# Patient Record
Sex: Male | Born: 1985 | Race: Black or African American | Hispanic: No | Marital: Single | Smoking: Current every day smoker
Health system: Southern US, Community
[De-identification: ages and names within clinical notes are randomized; demographics above are authoritative.]

## PROBLEM LIST (undated history)

## (undated) ENCOUNTER — Emergency Department (HOSPITAL_COMMUNITY): Admission: EM | Payer: MEDICAID | Source: Home / Self Care

## (undated) ENCOUNTER — Ambulatory Visit (HOSPITAL_COMMUNITY): Payer: No Payment, Other | Source: Home / Self Care

## (undated) ENCOUNTER — Ambulatory Visit (HOSPITAL_COMMUNITY): Admission: EM | Payer: No Payment, Other | Source: Home / Self Care

## (undated) DIAGNOSIS — F419 Anxiety disorder, unspecified: Secondary | ICD-10-CM

## (undated) DIAGNOSIS — F32A Depression, unspecified: Secondary | ICD-10-CM

## (undated) DIAGNOSIS — F200 Paranoid schizophrenia: Secondary | ICD-10-CM

## (undated) DIAGNOSIS — I639 Cerebral infarction, unspecified: Secondary | ICD-10-CM

## (undated) DIAGNOSIS — R519 Headache, unspecified: Secondary | ICD-10-CM

## (undated) DIAGNOSIS — J45909 Unspecified asthma, uncomplicated: Secondary | ICD-10-CM

## (undated) DIAGNOSIS — F209 Schizophrenia, unspecified: Secondary | ICD-10-CM

## (undated) HISTORY — DX: Cerebral infarction, unspecified: I63.9

## (undated) HISTORY — PX: NO PAST SURGERIES: SHX2092

## (undated) HISTORY — DX: Headache, unspecified: R51.9

## (undated) HISTORY — DX: Schizophrenia, unspecified: F20.9

---
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
## 2006-12-03 ENCOUNTER — Encounter: Admission: RE | Admit: 2006-12-03 | Discharge: 2006-12-03 | Payer: Self-pay | Admitting: Internal Medicine

## 2016-03-26 ENCOUNTER — Emergency Department (HOSPITAL_COMMUNITY)
Admission: EM | Admit: 2016-03-26 | Discharge: 2016-03-27 | Disposition: A | Discharge status: Home / Self Care | Payer: Self-pay | Attending: Emergency Medicine | Admitting: Emergency Medicine

## 2016-03-26 ENCOUNTER — Encounter (HOSPITAL_COMMUNITY): Payer: Self-pay | Admitting: *Deleted

## 2016-03-26 ENCOUNTER — Emergency Department (HOSPITAL_COMMUNITY): Payer: Self-pay

## 2016-03-26 DIAGNOSIS — M79602 Pain in left arm: Secondary | ICD-10-CM | POA: Insufficient documentation

## 2016-03-26 DIAGNOSIS — R079 Chest pain, unspecified: Secondary | ICD-10-CM | POA: Insufficient documentation

## 2016-03-26 DIAGNOSIS — F17200 Nicotine dependence, unspecified, uncomplicated: Secondary | ICD-10-CM | POA: Insufficient documentation

## 2016-03-26 DIAGNOSIS — J45909 Unspecified asthma, uncomplicated: Secondary | ICD-10-CM | POA: Insufficient documentation

## 2016-03-26 HISTORY — DX: Unspecified asthma, uncomplicated: J45.909

## 2016-03-26 LAB — CBC WITH DIFFERENTIAL/PLATELET
BASOS ABS: 0 10*3/uL (ref 0.0–0.1)
BASOS PCT: 1 %
EOS ABS: 0.5 10*3/uL (ref 0.0–0.7)
Eosinophils Relative: 7 %
HEMATOCRIT: 46.1 % (ref 39.0–52.0)
Hemoglobin: 15.9 g/dL (ref 13.0–17.0)
Lymphocytes Relative: 39 %
Lymphs Abs: 2.6 10*3/uL (ref 0.7–4.0)
MCH: 33.5 pg (ref 26.0–34.0)
MCHC: 34.5 g/dL (ref 30.0–36.0)
MCV: 97.1 fL (ref 78.0–100.0)
MONO ABS: 0.4 10*3/uL (ref 0.1–1.0)
Monocytes Relative: 6 %
NEUTROS ABS: 3.1 10*3/uL (ref 1.7–7.7)
Neutrophils Relative %: 47 %
PLATELETS: 293 10*3/uL (ref 150–400)
RBC: 4.75 MIL/uL (ref 4.22–5.81)
RDW: 12.1 % (ref 11.5–15.5)
WBC: 6.6 10*3/uL (ref 4.0–10.5)

## 2016-03-26 LAB — BASIC METABOLIC PANEL
ANION GAP: 12 (ref 5–15)
BUN: 10 mg/dL (ref 6–20)
CALCIUM: 9.4 mg/dL (ref 8.9–10.3)
CO2: 24 mmol/L (ref 22–32)
CREATININE: 0.87 mg/dL (ref 0.61–1.24)
Chloride: 101 mmol/L (ref 101–111)
GLUCOSE: 89 mg/dL (ref 65–99)
Potassium: 4.5 mmol/L (ref 3.5–5.1)
Sodium: 137 mmol/L (ref 135–145)

## 2016-03-26 LAB — I-STAT TROPONIN, ED: TROPONIN I, POC: 0.01 ng/mL (ref 0.00–0.08)

## 2016-03-26 LAB — MAGNESIUM: MAGNESIUM: 2.2 mg/dL (ref 1.7–2.4)

## 2016-03-26 LAB — POTASSIUM: Potassium: 3.9 mmol/L (ref 3.5–5.1)
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
## 2016-03-26 NOTE — ED Notes (Signed)
Patient transported to X-ray 

## 2016-03-26 NOTE — ED Provider Notes (Signed)
[facility]Y DEPT Provider Note   CSN: [id number] Arrival date & time: 03/26/16  1816  By signing my name below, I, Rosario AdieWilliam Andrew Hiatt, attest that this documentation has been prepared under the direction and in the presence of Arvilla MeresAshley Meyer, PA-C.  Electronically Signed: Rosario AdieWilliam Andrew Hiatt, ED Scribe. 03/26/16. 8:39 PM.  History   Chief Complaint Chief Complaint  Patient presents with  . Arm Pain   The history is provided by the patient. No language interpreter was used.    HPI Comments: Shane Small is a 30 y.o. male with a PMHx of asthma, who presents to the Emergency Department complaining of intermittent episodes of left hand and forearm cramping which began approximately one year ago. Pt states that during his episodes of cramping that his hand will ball up and close into a fist, which has happened three times over the past year. He states that this sensation will last until he pulls his fingers from his palm. Pt notes that he works at [employer] and does repetitive heavy lifting. No treatments for this issue were tried at home prior to coming into the ED. No recent trauma to the arm or changes in his daily routine to precipitate this issue. No Hx of PE/DVT, recent long travel, surgery, fracture, prolonged immobilization, hormone use. No h/o cancer. He is not currently followed by a PCP. Pt is a former smoker. He denies cough, hemoptysis, numbness, fever, wound involvement, or any other associated symptoms. He is curious if sxs are related to dehydration or electrolyte issues.   Pt also notes that he has intermittent episodes of left-sided, sharp chest pain over the past several months as well. Pt states that these episodes are completely alleviated with drinking water. He also notes chronic SOB secondary to his asthma.   Past Medical History:  Diagnosis Date  . Asthma    There are no active problems to display for this patient.  History reviewed. No pertinent surgical  history.  Home Medications    Prior to Admission medications   Not on File   Family History No family history on file.  Social History Social History  Substance Use Topics  . Smoking status: Current Some Day Smoker  . Smokeless tobacco: Former NeurosurgeonUser  . Alcohol use 8.4 oz/week    14 Cans of beer per week   Allergies   Patient has no known allergies.  Review of Systems Review of Systems  Constitutional: Negative for fever.  HENT: Negative for trouble swallowing.   Respiratory: Positive for shortness of breath ( secondary to asthma). Negative for cough.        Negative for hemoptysis.  Cardiovascular: Positive for chest pain ( intermittent).  Musculoskeletal: Positive for myalgias (left arm).  Skin: Negative for wound.  Allergic/Immunologic: Negative for immunocompromised state.  Neurological: Negative for syncope, weakness and numbness.   Physical Exam Updated Vital Signs BP 139/76 (BP Location: Right Arm)   Pulse 80   Temp 98.3 F (36.8 C) (Oral)   Resp 18   Ht 5\' 11"  (1.803 m)   Wt 150 lb (68 kg)   SpO2 98%   BMI 20.92 kg/m   Physical Exam  Constitutional: He appears well-developed and well-nourished. No distress.  HENT:  Head: Normocephalic and atraumatic.  Mouth/Throat: Oropharynx is clear and moist. No oropharyngeal exudate.  Eyes: Conjunctivae and EOM are normal. Pupils are equal, round, and reactive to light. Right eye exhibits no discharge. Left eye exhibits no discharge. No scleral icterus.  Neck: Normal  range of motion and phonation normal. Neck supple. No neck rigidity. Normal range of motion present.  Cardiovascular: Normal rate, regular rhythm, normal heart sounds and intact distal pulses.   No murmur heard. Pulmonary/Chest: Effort normal and breath sounds normal. No stridor. No respiratory distress. He has no wheezes. He has no rales.  Symmetric chest expansion. Respirations unlabored. No wheezing or rales. No hypoxia.   Abdominal: Soft. Bowel  sounds are normal. He exhibits no distension. There is no tenderness. There is no rigidity, no rebound, no guarding and no CVA tenderness.  Musculoskeletal: Normal range of motion.  No obvious deformity of left upper extremity. No swelling, warmth, erythema, or rashes appreciated. Compartments are soft. No tenderness. ROM, strength, sensation intact. Radial pulses 2+, symmetric. No lower extremity swelling.   Lymphadenopathy:    He has no cervical adenopathy.  Neurological: He is alert. He is not disoriented. Coordination and gait normal. GCS eye subscore is 4. GCS verbal subscore is 5. GCS motor subscore is 6.  Pt moves all extremities with ease. Strength and sensation intact. Pt ambulatory.   Skin: Skin is warm and dry. He is not diaphoretic.  Psychiatric: He has a normal mood and affect. His behavior is normal.   ED Treatments / Results  DIAGNOSTIC STUDIES: Oxygen Saturation is 98% on RA, normal by my interpretation.   COORDINATION OF CARE: 8:39 PM-Discussed next steps with pt. Pt verbalized understanding and is agreeable with the plan.   Labs (all labs ordered are listed, but only abnormal results are displayed) Labs Reviewed  BASIC METABOLIC PANEL  CBC WITH DIFFERENTIAL/PLATELET  MAGNESIUM  POTASSIUM  I-STAT TROPOININ, ED   EKG  EKG Interpretation  Date/Time:  Monday March 26 2016 20:56:42 EST Ventricular Rate:  78 PR Interval:  172 QRS Duration: 74 QT Interval:  362 QTC Calculation: 412 R Axis:   95 Text Interpretation:  Normal sinus rhythm Possible Left atrial enlargement Rightward axis Abnormal ECG No significant change since last tracing Confirmed by FLOYD MD, DANIEL [phone]) on 03/27/2016 2:36:17 PM      Radiology Dg Chest 2 View  Result Date: 03/26/2016 CLINICAL DATA:  Shortness of breath with chest pain EXAM: CHEST  2 VIEW COMPARISON:  None. FINDINGS: The lungs are hyperexpanded. There is no acute infiltrate, consolidation, or pleural effusion.  Cardiomediastinal silhouette within normal limits. No pneumothorax. IMPRESSION: Hyperinflation. No radiographic evidence for acute cardiopulmonary abnormality Electronically Signed   By: Jasmine Pang M.D.   On: 03/26/2016 21:25    Procedures Procedures   Medications Ordered in ED Medications - No data to display  Initial Impression / Assessment and Plan / ED Course  I have reviewed the triage vital signs and the nursing notes.  Pertinent labs & imaging results that were available during my care of the patient were reviewed by me and considered in my medical decision making (see chart for details).  Clinical Course as of Mar 28 1625  Tue Mar 27, 2016  0009 Normal cardiac silhouette. No evidence of consolidation, effusion, or PTX. No free air under diaphragm.  DG Chest 2 View [AM]    Clinical Course User Index [AM] Lona Kettle, PA-C   Patient presents to ED with complaint of intermittent left arm cramping x 1 year. Patient reports approximately 3 episodes in the last yr. No known trauma. Pt also complains of intermittent sharp, left sided chest pain for several months and chronic SOB secondary to asthma. Patient is afebrile and non-toxic appearing in NAD. VSS.  Lungs  are CTABL. Heart RRR. Left arm shows no obvious deformity, warmth, erythema, or swelling. Compartments soft, no tenderness. ROM, strength, sensation intact. 2+ radial pulses. Question of possible electrolyte abnormality. Given intermittent chest pain will check EKG, CXR, and basic labs. Discussed pt with Dr. Juleen ChinaKohut, who also evaluated patient, agrees with plan.   Labs are grossly normal. Troponin negative. EKG shows NSR, nml intervals, possible left atrial enlargement, right axis deviation. CXR shows hyperinflation, no acute cardiopulmonary process. Heart score 2. Low suspicion for ACS. Well's score 0, PERC negative. Low suspicion for PE at this time.  Electrolytes are within normal limits at this time. Unclear cause of  left arm cramping - ?dehydration vs. ?repetitive lifting causing muscle fatigue.   Discussed results and plan with patient. Encouraged patient to establish a primary provider for further evaluation and management. Resources provided for establishing a PCP. Return precautions given. Pt voiced understanding and is agreeable.   Final Clinical Impressions(s) / ED Diagnoses   Final diagnoses:  Chest pain, unspecified type  Left arm pain   New Prescriptions There are no discharge medications for this patient.  I personally performed the services described in this documentation, which was scribed in my presence. The recorded information has been reviewed and is accurate.     Lona Kettleshley Laurel Meyer, [place] JerseyPA-C 03/28/16 1648    Raeford RazorStephen Kohut, MD 03/28/16 1929

## 2016-03-27 NOTE — Discharge Instructions (Signed)
Read the information below.  Your labs and chest x-ray are re-assuring. This may be related to hydration status or over-exertion.  Please continue to stay well hydrated.  It is very important that you establish and follow up with a primary provider. I [facility]. Please call in the morning to establish care.  You may return to the Emergency Department at any time for worsening condition or any new symptoms that concern you.

## 2016-06-14 ENCOUNTER — Encounter (HOSPITAL_COMMUNITY): Payer: Self-pay | Admitting: *Deleted

## 2016-06-14 ENCOUNTER — Ambulatory Visit (HOSPITAL_COMMUNITY)
Admission: EM | Admit: 2016-06-14 | Discharge: 2016-06-14 | Disposition: A | Discharge status: Home / Self Care | Payer: Self-pay | Attending: Internal Medicine | Admitting: Internal Medicine

## 2016-06-14 DIAGNOSIS — J4541 Moderate persistent asthma with (acute) exacerbation: Secondary | ICD-10-CM

## 2016-06-14 DIAGNOSIS — Z760 Encounter for issue of repeat prescription: Secondary | ICD-10-CM

## 2016-06-14 MED ORDER — ALBUTEROL SULFATE HFA 108 (90 BASE) MCG/ACT IN AERS: 2.0000 | INHALATION_SPRAY | RESPIRATORY_TRACT | 0 refills | Status: DC | PRN

## 2016-06-14 MED ORDER — IPRATROPIUM-ALBUTEROL 0.5-2.5 (3) MG/3ML IN SOLN
3.0000 mL | Freq: Once | RESPIRATORY_TRACT | Status: AC
  Administered 2016-06-14: 3 mL via RESPIRATORY_TRACT

## 2016-06-14 MED ORDER — IPRATROPIUM-ALBUTEROL 0.5-2.5 (3) MG/3ML IN SOLN
RESPIRATORY_TRACT | Status: AC
  Filled 2016-06-14: qty 3

## 2016-06-14 NOTE — ED Triage Notes (Signed)
Patient states he ran out of his asthma medication 3 days, states he has had increased sob and cough since.

## 2016-06-14 NOTE — Discharge Instructions (Addendum)
Follow-up with your primary care doctor as needed. Make sure you keep your appointments for asthma management. You are given a refill of her albuterol inhaler today. For worsening, new symptoms, fevers, increased shortness of breath you may need to go to the emergency department.

## 2016-06-14 NOTE — ED Provider Notes (Signed)
CSN: [id number]     Arrival date & time 06/14/16  1909 History   First MD Initiated Contact with Patient 06/14/16 1927     Chief Complaint  Patient presents with  . Asthma  . Medication Refill   (Consider location/radiation/quality/duration/timing/severity/associated sxs/prior Treatment) 31 year old asthmatic states he ran out of his medicines a week ago and he is having an asthma exacerbation. He is requesting refills of his medicines. Denies upper respiratory symptoms just shortness of breath, wheezing and cough.      Past Medical History:  Diagnosis Date  . Asthma    History reviewed. No pertinent surgical history. History reviewed. No pertinent family history. Social History  Substance Use Topics  . Smoking status: Current Some Day Smoker  . Smokeless tobacco: Former Neurosurgeon  . Alcohol use 8.4 oz/week    14 Cans of beer per week    Review of Systems  HENT: Negative.   Respiratory: Positive for cough, shortness of breath and wheezing.   Cardiovascular: Negative.   Gastrointestinal: Negative.   Neurological: Negative.   All other systems reviewed and are negative.   Allergies  Patient has no known allergies.  Home Medications   Prior to Admission medications   Medication Sig Start Date End Date Taking? Authorizing Provider  albuterol (PROVENTIL HFA;VENTOLIN HFA) 108 (90 Base) MCG/ACT inhaler Inhale 2 puffs into the lungs every 4 (four) hours as needed for wheezing or shortness of breath. 06/14/16   Hayden Rasmussen, NP   Meds Ordered and Administered this Visit   Medications  ipratropium-albuterol (DUONEB) 0.5-2.5 (3) MG/3ML nebulizer solution 3 mL (not administered)    BP 150/98 (BP Location: Left Arm)   Pulse 64   Temp 98.1 F (36.7 C) (Oral)   Resp 16   SpO2 98%  No data found.   Physical Exam  Constitutional: He is oriented to person, place, and time. He appears well-developed and well-nourished.  HENT:  Head: Normocephalic and atraumatic.  Right Ear:  External ear normal.  Left Ear: External ear normal.  Eyes: EOM are normal.  Neck: Normal range of motion.  Cardiovascular: Normal rate, regular rhythm, normal heart sounds and intact distal pulses.   Pulmonary/Chest: He has wheezes.  Expiratory wheezes, prolonged expiratory phase.  Musculoskeletal: He exhibits no edema.  Lymphadenopathy:    He has no cervical adenopathy.  Neurological: He is alert and oriented to person, place, and time.  Skin: Skin is warm and dry.  Nursing note and vitals reviewed.   Urgent Care Course     Procedures (including critical care time)  Labs Review Labs Reviewed - No data to display  Imaging Review No results found.   Visual Acuity Review  Right Eye Distance:   Left Eye Distance:   Bilateral Distance:    Right Eye Near:   Left Eye Near:    Bilateral Near:         MDM   1. Moderate persistent asthma with exacerbation   2. Medication refill   Post Duoneb pt st breathing much better. Improved air movement and substantially reduced wheeze. The patient was offered an injection of steroids but the patient states that he does not do shots and does not take medicines that are unnecessary. Follow-up with your primary care doctor as needed. Make sure you keep your appointments for asthma management. You are given a refill of her albuterol inhaler today. For worsening, new symptoms, fevers, increased shortness of breath you may need to go to the emergency department. Meds ordered this  encounter  Medications  . ipratropium-albuterol (DUONEB) 0.5-2.5 (3) MG/3ML nebulizer solution 3 mL  . albuterol (PROVENTIL HFA;VENTOLIN HFA) 108 (90 Base) MCG/ACT inhaler    Sig: Inhale 2 puffs into the lungs every 4 (four) hours as needed for wheezing or shortness of breath.    Dispense:  1 Inhaler    Refill:  0    Order Specific Question:   Supervising Provider    Answer:   Eustace MooreMURRAY, LAURA W [[id number]]        Hayden Rasmussenavid Desira Alessandrini, NP 06/14/16 [phone]

## 2016-09-04 ENCOUNTER — Emergency Department (HOSPITAL_COMMUNITY)
Admission: EM | Admit: 2016-09-04 | Discharge: 2016-09-05 | Disposition: A | Discharge status: Home / Self Care | Payer: Self-pay | Attending: Emergency Medicine | Admitting: Emergency Medicine

## 2016-09-04 ENCOUNTER — Encounter (HOSPITAL_COMMUNITY): Payer: Self-pay

## 2016-09-04 DIAGNOSIS — F17200 Nicotine dependence, unspecified, uncomplicated: Secondary | ICD-10-CM | POA: Insufficient documentation

## 2016-09-04 DIAGNOSIS — Z79899 Other long term (current) drug therapy: Secondary | ICD-10-CM | POA: Insufficient documentation

## 2016-09-04 DIAGNOSIS — M79632 Pain in left forearm: Secondary | ICD-10-CM | POA: Insufficient documentation

## 2016-09-04 DIAGNOSIS — J45909 Unspecified asthma, uncomplicated: Secondary | ICD-10-CM | POA: Insufficient documentation

## 2016-09-04 DIAGNOSIS — M25551 Pain in right hip: Secondary | ICD-10-CM | POA: Insufficient documentation

## 2016-09-04 DIAGNOSIS — F22 Delusional disorders: Secondary | ICD-10-CM | POA: Insufficient documentation

## 2016-09-04 DIAGNOSIS — M79602 Pain in left arm: Secondary | ICD-10-CM | POA: Insufficient documentation

## 2016-09-04 DIAGNOSIS — R45850 Homicidal ideations: Secondary | ICD-10-CM | POA: Insufficient documentation

## 2016-09-04 DIAGNOSIS — R112 Nausea with vomiting, unspecified: Secondary | ICD-10-CM | POA: Insufficient documentation

## 2016-09-04 LAB — CBC WITH DIFFERENTIAL/PLATELET
BASOS ABS: 0 10*3/uL (ref 0.0–0.1)
Basophils Relative: 1 %
EOS ABS: 0.5 10*3/uL (ref 0.0–0.7)
EOS PCT: 8 %
HCT: 45.6 % (ref 39.0–52.0)
Hemoglobin: 15.6 g/dL (ref 13.0–17.0)
LYMPHS ABS: 2.4 10*3/uL (ref 0.7–4.0)
Lymphocytes Relative: 34 %
MCH: 34.1 pg — AB (ref 26.0–34.0)
MCHC: 34.2 g/dL (ref 30.0–36.0)
MCV: 99.8 fL (ref 78.0–100.0)
MONO ABS: 0.4 10*3/uL (ref 0.1–1.0)
Monocytes Relative: 5 %
Neutro Abs: 3.6 10*3/uL (ref 1.7–7.7)
Neutrophils Relative %: 52 %
PLATELETS: 296 10*3/uL (ref 150–400)
RBC: 4.57 MIL/uL (ref 4.22–5.81)
RDW: 11.9 % (ref 11.5–15.5)
WBC: 7 10*3/uL (ref 4.0–10.5)

## 2016-09-04 LAB — COMPREHENSIVE METABOLIC PANEL
ALT: 20 U/L (ref 17–63)
AST: 26 U/L (ref 15–41)
Albumin: 4.9 g/dL (ref 3.5–5.0)
Alkaline Phosphatase: 64 U/L (ref 38–126)
Anion gap: 10 (ref 5–15)
BILIRUBIN TOTAL: 1 mg/dL (ref 0.3–1.2)
BUN: 9 mg/dL (ref 6–20)
CO2: 29 mmol/L (ref 22–32)
Calcium: 10.4 mg/dL — ABNORMAL HIGH (ref 8.9–10.3)
Chloride: 100 mmol/L — ABNORMAL LOW (ref 101–111)
Creatinine, Ser: 0.8 mg/dL (ref 0.61–1.24)
GFR calc Af Amer: >60 mL/min (ref >60)
GLUCOSE: 96 mg/dL (ref 65–99)
Potassium: 4.2 mmol/L (ref 3.5–5.1)
Sodium: 139 mmol/L (ref 135–145)
TOTAL PROTEIN: 8.8 g/dL — AB (ref 6.5–8.1)

## 2016-09-04 LAB — ETHANOL

## 2016-09-04 MED ORDER — NICOTINE 21 MG/24HR TD PT24: 21.0000 mg | MEDICATED_PATCH | Freq: Every day | TRANSDERMAL | Status: DC

## 2016-09-04 MED ORDER — ACETAMINOPHEN 325 MG PO TABS: 650.0000 mg | ORAL_TABLET | ORAL | Status: DC | PRN

## 2016-09-04 MED ORDER — ALUM & MAG HYDROXIDE-SIMETH 200-200-20 MG/5ML PO SUSP: 30.0000 mL | ORAL | Status: DC | PRN

## 2016-09-04 MED ORDER — ONDANSETRON HCL 4 MG PO TABS: 4.0000 mg | ORAL_TABLET | Freq: Three times a day (TID) | ORAL | Status: DC | PRN

## 2016-09-04 MED ORDER — IBUPROFEN 200 MG PO TABS: 600.0000 mg | ORAL_TABLET | Freq: Three times a day (TID) | ORAL | Status: DC | PRN

## 2016-09-04 MED ORDER — LORAZEPAM 1 MG PO TABS: 1.0000 mg | ORAL_TABLET | Freq: Three times a day (TID) | ORAL | Status: DC | PRN

## 2016-09-04 NOTE — ED Notes (Signed)
Call Nicanor AlconKenneth Cala if there are any updates are questions.  [phone]

## 2016-09-04 NOTE — ED Notes (Signed)
THE FATHER STS THAT THE PT IS DELUSIONAL AND HE IS GOING TO FILE FOR IVC PAPERWORK. PA EMILY MADE AWARE.

## 2016-09-04 NOTE — ED Notes (Signed)
Patient educated about search process and term "contraband " and routine search performed. No contraband found. 
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
## 2016-09-04 NOTE — ED Triage Notes (Addendum)
Patient reports that he has been having cramping in both arms and feels like his arms ar locking up. L> R. Patient denies any pain or discomfort at this time in either arm.  Patient's father reports that the patient has been seen 3 times for the same and nothing has been found.

## 2016-09-04 NOTE — ED Provider Notes (Signed)
Medical screening examination/treatment/procedure(s) were conducted as a shared visit with non-physician practitioner(s) and myself.  I personally evaluated the patient during the encounter. Briefly, the patient is a 31 y.o. male who presents to the ED with left arm pain believing he got a blood clot and superficial vein of his arm, also states that he's been contaminated with Escherichia coli for several years which given him constipation and abdominal pain when he drinks coffee. Patient also reports he believes he has radiation poisoning [place] [facility] [place] causing him to have all these symptoms as well too. That reported that the patient disappeared yesterday [place] [facility] [place]" and try to figure out what was going on with the radiation poisoning. Patient also reports that while smoking cigarettes he feels like he admits he and radiation from his body. He states that he knows this is true because of the pupil react differently to him when he smokes.  Father also reports that the patient has been expressing homicidal ideations and aggression towards family members. Patient reports that this is more of a self defense issue stating that he believes his grandparents are trying to hurt him and states that he will kill them before they kill him.  Patient denies illicit drug use. He does report alcohol consumption last of which was yesterday after visiting [place].  Patient appears to be having delusions and homicidal ideations requiring IVC which has been filled out and signed by me. Patient will require medical screening labs. If cleared he will need evaluation by psychiatry.     Nira Connardama, Pedro Eduardo, MD 09/04/16 [phone]

## 2016-09-05 ENCOUNTER — Inpatient Hospital Stay (HOSPITAL_COMMUNITY)
Admission: AD | Admit: 2016-09-05 | Discharge: 2016-09-14 | DRG: 885 | Disposition: A | Discharge status: Home / Self Care | Payer: No Typology Code available for payment source | Attending: Psychiatry | Admitting: Psychiatry

## 2016-09-05 DIAGNOSIS — R454 Irritability and anger: Secondary | ICD-10-CM

## 2016-09-05 DIAGNOSIS — G4700 Insomnia, unspecified: Secondary | ICD-10-CM | POA: Diagnosis present

## 2016-09-05 DIAGNOSIS — F1220 Cannabis dependence, uncomplicated: Secondary | ICD-10-CM | POA: Diagnosis present

## 2016-09-05 DIAGNOSIS — F1994 Other psychoactive substance use, unspecified with psychoactive substance-induced mood disorder: Secondary | ICD-10-CM | POA: Diagnosis present

## 2016-09-05 DIAGNOSIS — F22 Delusional disorders: Secondary | ICD-10-CM

## 2016-09-05 DIAGNOSIS — F200 Paranoid schizophrenia: Secondary | ICD-10-CM | POA: Diagnosis present

## 2016-09-05 DIAGNOSIS — F17200 Nicotine dependence, unspecified, uncomplicated: Secondary | ICD-10-CM | POA: Diagnosis present

## 2016-09-05 DIAGNOSIS — R45851 Suicidal ideations: Secondary | ICD-10-CM

## 2016-09-05 DIAGNOSIS — F17210 Nicotine dependence, cigarettes, uncomplicated: Secondary | ICD-10-CM

## 2016-09-05 LAB — RAPID URINE DRUG SCREEN, HOSP PERFORMED
AMPHETAMINES: NOT DETECTED
Barbiturates: NOT DETECTED
Benzodiazepines: NOT DETECTED
COCAINE: NOT DETECTED
Opiates: NOT DETECTED
TETRAHYDROCANNABINOL: NOT DETECTED

## 2016-09-05 MED ORDER — ACETAMINOPHEN 325 MG PO TABS
650.0000 mg | ORAL_TABLET | Freq: Four times a day (QID) | ORAL | Status: DC | PRN
  Administered 2016-09-05 – 2016-09-09 (×3): 650 mg via ORAL
  Filled 2016-09-05 (×3): qty 2

## 2016-09-05 MED ORDER — LORAZEPAM 1 MG PO TABS
1.0000 mg | ORAL_TABLET | Freq: Three times a day (TID) | ORAL | Status: DC | PRN
  Administered 2016-09-05: 1 mg via ORAL
  Filled 2016-09-05: qty 1

## 2016-09-05 MED ORDER — ALUM & MAG HYDROXIDE-SIMETH 200-200-20 MG/5ML PO SUSP: 30.0000 mL | ORAL | Status: DC | PRN

## 2016-09-05 MED ORDER — NICOTINE 21 MG/24HR TD PT24
21.0000 mg | MEDICATED_PATCH | Freq: Every day | TRANSDERMAL | Status: DC
  Administered 2016-09-06: 21 mg via TRANSDERMAL
  Filled 2016-09-05 (×4): qty 1

## 2016-09-05 MED ORDER — TRAZODONE HCL 50 MG PO TABS
50.0000 mg | ORAL_TABLET | Freq: Every evening | ORAL | Status: DC | PRN
  Administered 2016-09-08 – 2016-09-13 (×5): 50 mg via ORAL
  Filled 2016-09-05 (×4): qty 1
  Filled 2016-09-05: qty 7
  Filled 2016-09-05 (×2): qty 1

## 2016-09-05 MED ORDER — IBUPROFEN 600 MG PO TABS
600.0000 mg | ORAL_TABLET | Freq: Three times a day (TID) | ORAL | Status: DC | PRN
  Administered 2016-09-09: 600 mg via ORAL
  Filled 2016-09-05: qty 1

## 2016-09-05 MED ORDER — ONDANSETRON HCL 4 MG PO TABS: 4.0000 mg | ORAL_TABLET | Freq: Three times a day (TID) | ORAL | Status: DC | PRN

## 2016-09-05 MED ORDER — MAGNESIUM HYDROXIDE 400 MG/5ML PO SUSP
30.0000 mL | Freq: Every day | ORAL | Status: DC | PRN
  Administered 2016-09-14: 30 mL via ORAL
  Filled 2016-09-05: qty 30

## 2016-09-05 MED ORDER — ALBUTEROL SULFATE HFA 108 (90 BASE) MCG/ACT IN AERS
2.0000 | INHALATION_SPRAY | Freq: Four times a day (QID) | RESPIRATORY_TRACT | Status: DC | PRN
  Administered 2016-09-06 – 2016-09-10 (×5): 2 via RESPIRATORY_TRACT
  Filled 2016-09-05: qty 6.7

## 2016-09-05 NOTE — Progress Notes (Signed)
Admission Note:  31 year old male who presents IVC, in no acute distress, for the treatment of SI, Depression, and Psychosis. Patient appears flat and depressed. Patient was calm and cooperative with admission process. Patient presents with passive SI and contracts for safety upon admission. Denies HI.  Patient endorses AVH stating "The TV be talking to me".  Patient [place] because he thought that their water was making him sick. [place] [facility], patient got a DWI resulting in additional stress.  Patient [place] him to experience AVH and have nightmares, headaches, and a possible stroke.  Patient reports experiencing these symptoms for the past 2 years.  Additionally, patient reports paranoia stating "sometimes I'm too paranoid to sleep due to nightmares".  Patient reports increasing suicidal thoughts recently.  Patient reports that he is currently unemployed and lost his job "2 weeks ago".  Patient currently lives with his dad and stepmom and identifies his biological mom and dad as his support system.  [facility], patient would like to "get rid of paranoia I have" and "Try to stop drinking".  Skin was assessed and found to be clear of any abnormal marks.  Patient searched and no contraband found, POC and unit policies explained and understanding verbalized. Consents obtained. Food and fluids offered and accepted. Patient had no additional questions or concerns.

## 2016-09-05 NOTE — Tx Team (Signed)
Initial Treatment Plan 09/05/2016 11:54 PM Shane MoraleDerrick L Small [id number]    PATIENT STRESSORS: Substance abuse Other: Psychosis   PATIENT STRENGTHS: General fund of knowledge Motivation for treatment/growth Supportive family/friends   PATIENT IDENTIFIED PROBLEMS: Psychosis  "Get rid of paranoia I have"  "Try to stop drinking"                 DISCHARGE CRITERIA:  Improved stabilization in mood, thinking, and/or behavior Motivation to continue treatment in a less acute level of care Need for constant or close observation no longer present Verbal commitment to aftercare and medication compliance  PRELIMINARY DISCHARGE PLAN: Outpatient therapy Return to previous living arrangement  PATIENT/FAMILY INVOLVEMENT: This treatment plan has been presented to and reviewed with the patient, Shane Small.  The patient and family have been given the opportunity to ask questions and make suggestions.  Carleene OverlieMiddleton, Kelley Knoth P, RN 09/05/2016, 11:54 PM

## 2016-09-05 NOTE — Progress Notes (Signed)
09/05/16 1410:  LRT went to pt room to offer activities, pt was sleep.  Quamaine Webb, LRT/CTRS 

## 2016-09-05 NOTE — BH Assessment (Signed)
[facility] Assessment Progress Note  Per Thedore MinsMojeed Akintayo, MD, this pt requires psychiatric hospitalization.  Berneice Heinrichina Tate, RN, [facility].  Pt presents under IVC initiated by EDP Drema PryPedro Cardama, MD,  and [facility]  Pt's nurse, Christen BameRonnie, has been notified, and agrees to call report to [phone].  Pt is to be transported via [employer].   Doylene Canninghomas Koury Roddy, MA Triage Specialist [phone]

## 2016-09-05 NOTE — ED Notes (Signed)
Patient appears to be having delusions and homicidal ideations . Patient denies SI and AVH. Plan of care discussed. Encouragement and support provided and safety maintain. Q 15 min safety checks in place and video monitoring.

## 2016-09-05 NOTE — ED Notes (Signed)
[facility]  [facility]
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
## 2016-09-05 NOTE — ED Notes (Signed)
Pt transported to [facility] by [employer] for continuation of specialized care. Pt left in no acute distress. Belongings signed for and given to [employer] officer. Pt left in no acute distress. 

## 2016-09-06 ENCOUNTER — Encounter (HOSPITAL_COMMUNITY): Payer: Self-pay | Admitting: *Deleted

## 2016-09-06 DIAGNOSIS — R45850 Homicidal ideations: Secondary | ICD-10-CM

## 2016-09-06 DIAGNOSIS — F17210 Nicotine dependence, cigarettes, uncomplicated: Secondary | ICD-10-CM

## 2016-09-06 DIAGNOSIS — F200 Paranoid schizophrenia: Secondary | ICD-10-CM

## 2016-09-06 DIAGNOSIS — F1994 Other psychoactive substance use, unspecified with psychoactive substance-induced mood disorder: Secondary | ICD-10-CM

## 2016-09-06 MED ORDER — ARIPIPRAZOLE 10 MG PO TABS
10.0000 mg | ORAL_TABLET | Freq: Once | ORAL | Status: AC
  Administered 2016-09-06: 10 mg via ORAL
  Filled 2016-09-06 (×2): qty 1

## 2016-09-06 MED ORDER — ARIPIPRAZOLE 10 MG PO TABS
20.0000 mg | ORAL_TABLET | Freq: Every day | ORAL | Status: DC
  Administered 2016-09-07 – 2016-09-09 (×3): 20 mg via ORAL
  Filled 2016-09-06 (×5): qty 2

## 2016-09-06 NOTE — H&P (Signed)
Psychiatric Admission Assessment Adult  Patient Identification: Shane Small MRN:  [id number] Date of Evaluation:  09/06/2016 Chief Complaint:  DELUSIONAL DISORDER Principal Diagnosis: <principal problem not specified> Diagnosis:   Patient Active Problem List   Diagnosis Date Noted  . Substance induced mood disorder [facility]) [F19.94] 09/05/2016   History of Present Illness:  31 yo AAM, single, unemployed, lives with his parents. No past history of mental illness. Presented to the ER in company of his father. Repeated ER visits on account of somatic complaints. Reports tactile hallucinations in his extremities. Has been expressing paranoid delusions about being poisoned at [employer] years ago. Believes that the water was contaminated with E. Coli and radiation. Has beliefs that this is what is causing his tactile hallucinations. Reports ideas of reference from the TV for the past couple of weeks. Reported to abuse cocaine and THC though UDS was negative at presentation. Drove dangerously to [employer] based on his delusions. Was booked for dangerous driving.  Father is very concerned as he has been very bizarre. Father is concerned that he might be potentially homicidal.  At interview, he reports hearing people outside the house "talking crazy". Says when he complains to his family, no one takes him seriously. Feels they talk about him on TV. Says he is concerned they is a conspiracy against him. He is not sure of who is behind this.  He was very stressed a couple of weeks ago. Says he tried to cut his wrist. Patient is not feeling suicidal at this time. Says he is very watchful. He feels there is some people out to get him. Not expressing any homicidal thoughts towards anyone in particular.  He has not been eating as much as he used to. He has lost a bit of weight. Says he lost his job a couple of months ago because he was not getting along with anyone at work. He does not have friends. He  states that his relationship with his child's mom has been stressful. Patient admits use of THC. Not sure if he uses synthetic or natural product. Says last use was over three months ago. Denies use of any other illicit substance.     Associated Signs/Symptoms: Depression Symptoms:  As above (Hypo) Manic Symptoms:  As above Anxiety Symptoms:  As above Psychotic Symptoms:  As above PTSD Symptoms: NA Total Time spent with patient: 1 hour  Past Psychiatric History: No past hospitalization. Has never been on psychotropic medication. Single episode if self harming behavior. No access to weapons.   Is the patient at risk to self? No.  Has the patient been a risk to self in the past 6 months? Yes.    Has the patient been a risk to self within the distant past? No.  Is the patient a risk to others? Yes.    Has the patient been a risk to others in the past 6 months? Yes.    Has the patient been a risk to others within the distant past? Yes.     Prior Inpatient Therapy:   Prior Outpatient Therapy:    Alcohol Screening: 1. How often do you have a drink containing alcohol?: 2 to 4 times a month 2. How many drinks containing alcohol do you have on a typical day when you are drinking?: 1 or 2 3. How often do you have six or more drinks on one occasion?: Never Preliminary Score: 0 9. Have you or someone else been injured as a result of your  drinking?: No 10. Has a relative or friend or a doctor or another health worker been concerned about your drinking or suggested you cut down?: No Alcohol Use Disorder Identification Test Final Score (AUDIT): 2 Brief Intervention: AUDIT score less than 7 or less-screening does not suggest unhealthy drinking-brief intervention not indicated Substance Abuse History in the last 12 months:  Yes.   Consequences of Substance Abuse: NA Previous Psychotropic Medications: No  Psychological Evaluations: No  Past Medical History:  Past Medical History:  Diagnosis Date   . Asthma    History reviewed. No pertinent surgical history. Family History: History reviewed. No pertinent family history. Family Psychiatric  History: None as per patient Tobacco Screening: Have you used any form of tobacco in the last 30 days? (Cigarettes, Smokeless Tobacco, Cigars, and/or Pipes): Yes Tobacco use, Select all that apply: 5 or more cigarettes per day Are you interested in Tobacco Cessation Medications?: Yes, will notify MD for an order Counseled patient on smoking cessation including recognizing danger situations, developing coping skills and basic information about quitting provided: Yes Social History:  History  Alcohol Use  . Yes    Comment: occasionally     History  Drug Use No    Additional Social History: Are you sexually active?: No What is your sexual orientation?: straight Does patient have children?: Yes How many children?: 1 How is patient's relationship with their children?: MaKenzie-5-haven't sen her in 2 months      Raised by his mom and step father. No childhood adversity. Did not live school. Says he did not do well. Dropped out in high school. Has been at multiple jobs. Has not been able to hold his job very long. Never been married. No current relationship. No pending legal issues. No religious affiliation. Some support from his family.   Allergies:  No Known Allergies Lab Results:  Results for orders placed or performed during the hospital encounter of 09/04/16 (from the past 48 hour(s))  Comprehensive metabolic panel     Status: Abnormal   Collection Time: 09/04/16  6:32 PM  Result Value Ref Range   Sodium 139 135 - 145 mmol/L   Potassium 4.2 3.5 - 5.1 mmol/L   Chloride 100 (L) 101 - 111 mmol/L   CO2 29 22 - 32 mmol/L   Glucose, Bld 96 65 - 99 mg/dL   BUN 9 6 - 20 mg/dL   Creatinine, Ser 0.80 0.61 - 1.24 mg/dL   Calcium 10.4 (H) 8.9 - 10.3 mg/dL   Total Protein 8.8 (H) 6.5 - 8.1 g/dL   Albumin 4.9 3.5 - 5.0 g/dL   AST 26 15 - 41 U/L    ALT 20 17 - 63 U/L   Alkaline Phosphatase 64 38 - 126 U/L   Total Bilirubin 1.0 0.3 - 1.2 mg/dL   GFR calc non Af Amer >60 >60 mL/min   GFR calc Af Amer >60 >60 mL/min    Comment: (NOTE) The eGFR has been calculated using the CKD EPI equation. This calculation has not been validated in all clinical situations. eGFR's persistently <60 mL/min signify possible Chronic Kidney Disease.    Anion gap 10 5 - 15  CBC with Differential     Status: Abnormal   Collection Time: 09/04/16  6:32 PM  Result Value Ref Range   WBC 7.0 4.0 - 10.5 K/uL   RBC 4.57 4.22 - 5.81 MIL/uL   Hemoglobin 15.6 13.0 - 17.0 g/dL   HCT 45.6 39.0 - 52.0 %   MCV  99.8 78.0 - 100.0 fL   MCH 34.1 (H) 26.0 - 34.0 pg   MCHC 34.2 30.0 - 36.0 g/dL   RDW 11.9 11.5 - 15.5 %   Platelets 296 150 - 400 K/uL   Neutrophils Relative % 52 %   Neutro Abs 3.6 1.7 - 7.7 K/uL   Lymphocytes Relative 34 %   Lymphs Abs 2.4 0.7 - 4.0 K/uL   Monocytes Relative 5 %   Monocytes Absolute 0.4 0.1 - 1.0 K/uL   Eosinophils Relative 8 %   Eosinophils Absolute 0.5 0.0 - 0.7 K/uL   Basophils Relative 1 %   Basophils Absolute 0.0 0.0 - 0.1 K/uL  Ethanol     Status: None   Collection Time: 09/04/16  6:32 PM  Result Value Ref Range   Alcohol, Ethyl (B) <5 <5 mg/dL    Comment:        LOWEST DETECTABLE LIMIT FOR SERUM ALCOHOL IS 5 mg/dL FOR MEDICAL PURPOSES ONLY   Urine rapid drug screen (hosp performed)     Status: None   Collection Time: 09/05/16 12:33 PM  Result Value Ref Range   Opiates NONE DETECTED NONE DETECTED   Cocaine NONE DETECTED NONE DETECTED   Benzodiazepines NONE DETECTED NONE DETECTED   Amphetamines NONE DETECTED NONE DETECTED   Tetrahydrocannabinol NONE DETECTED NONE DETECTED   Barbiturates NONE DETECTED NONE DETECTED    Comment:        DRUG SCREEN FOR MEDICAL PURPOSES ONLY.  IF CONFIRMATION IS NEEDED FOR ANY PURPOSE, NOTIFY LAB WITHIN 5 DAYS.        LOWEST DETECTABLE LIMITS FOR URINE DRUG SCREEN Drug Class        Cutoff (ng/mL) Amphetamine      1000 Barbiturate      200 Benzodiazepine   332 Tricyclics       951 Opiates          300 Cocaine          300 THC              50     Blood Alcohol level:  Lab Results  Component Value Date   ETH <5 88/41/6606    Metabolic Disorder Labs:  No results found for: HGBA1C, MPG No results found for: PROLACTIN No results found for: CHOL, TRIG, HDL, CHOLHDL, VLDL, LDLCALC  Current Medications: Current Facility-Administered Medications  Medication Dose Route Frequency Provider Last Rate Last Dose  . acetaminophen (TYLENOL) tablet 650 mg  650 mg Oral Q6H PRN Kerrie Buffalo, NP   650 mg at 09/05/16 2154  . albuterol (PROVENTIL HFA;VENTOLIN HFA) 108 (90 Base) MCG/ACT inhaler 2 puff  2 puff Inhalation Q6H PRN Laverle Hobby, PA-C      . alum & mag hydroxide-simeth (MAALOX/MYLANTA) 200-200-20 MG/5ML suspension 30 mL  30 mL Oral Q4H PRN Kerrie Buffalo, NP      . ibuprofen (ADVIL,MOTRIN) tablet 600 mg  600 mg Oral Q8H PRN Kerrie Buffalo, NP      . LORazepam (ATIVAN) tablet 1 mg  1 mg Oral Q8H PRN Kerrie Buffalo, NP   1 mg at 09/05/16 2154  . magnesium hydroxide (MILK OF MAGNESIA) suspension 30 mL  30 mL Oral Daily PRN Kerrie Buffalo, NP      . nicotine (NICODERM CQ - dosed in mg/24 hours) patch 21 mg  21 mg Transdermal Daily Kerrie Buffalo, NP   21 mg at 09/06/16 0810  . ondansetron (ZOFRAN) tablet 4 mg  4 mg Oral Q8H PRN Kerrie Buffalo,  NP      . traZODone (DESYREL) tablet 50 mg  50 mg Oral QHS PRN Kerrie Buffalo, NP       PTA Medications: Prescriptions Prior to Admission  Medication Sig Dispense Refill Last Dose  . albuterol (PROVENTIL HFA;VENTOLIN HFA) 108 (90 Base) MCG/ACT inhaler Inhale 2 puffs into the lungs every 4 (four) hours as needed for wheezing or shortness of breath. 1 Inhaler 0 09/03/2016 at Unknown time  . OVER THE COUNTER MEDICATION Take 1-2 tablets by mouth daily as needed (allergies, runny nose). OTC allergy medication.   09/03/2016 at  Unknown time  . OVER THE COUNTER MEDICATION Place 1 drop into both eyes 4 (four) times daily as needed (dry eyes). OTC eye drops.   Unknown at Unknown time    Musculoskeletal: Strength & Muscle Tone: within normal limits Gait & Station: normal Patient leans: N/A  Psychiatric Specialty Exam: Physical Exam  Constitutional: He is oriented to person, place, and time. He appears well-developed.  HENT:  Head: Normocephalic and atraumatic.  Eyes: Conjunctivae are normal. Pupils are equal, round, and reactive to light.  Neck: Normal range of motion. Neck supple.  Cardiovascular: Normal rate and regular rhythm.   Respiratory: Effort normal and breath sounds normal.  GI: Soft. Bowel sounds are normal.  Musculoskeletal: Normal range of motion.  Neurological: He is alert and oriented to person, place, and time.  Skin: Skin is warm and dry.  Psychiatric:  As above.     ROS  Blood pressure 121/72, pulse 80, temperature 98.1 F (36.7 C), temperature source Oral, resp. rate 16, height 5' 11"  (1.803 m), weight 65.3 kg (144 lb).Body mass index is 20.08 kg/m.  General Appearance: In hospital clothing. Poor personal hygiene. Guarded. Odd mannerism.   Eye Contact:  Fair  Speech:  Clear and Coherent and Normal Rate  Volume:  Normal  Mood:  Overwhelmed by his subjective experience  Affect:  Flat  Thought Process:  Linear  Orientation:  Full (Time, Place, and Person)  Thought Content:  Paranoid and persecutory delusion. Ideas of reference. Auditory and tactile hallucinations.   Suicidal Thoughts:  No  Homicidal Thoughts:  Yes.  without intent/plan  Memory:  Immediate;   Fair Recent;   Fair Remote;   Fair  Judgement:  Impaired  Insight:  Partial  Psychomotor Activity:  Decreased  Concentration:  Concentration: Fair and Attention Span: Fair  Recall:  [employer] of Knowledge:  Fair  Language:  Good  Akathisia:  No  Handed:    AIMS (if indicated):     Assets:  Desire for Improvement Housing   ADL's:  Impaired  Cognition:  WNL  Sleep:  Number of Hours: 6.75    Treatment Plan Summary: Patient is presenting with protracted history of systematized delusional belief. This associated with tactile and auditory hallucinations. Symptoms have been going on for over a year. He has not been able to function adequately at work. He has not been able to relate with people. I discussed use of Abilify with him. We explored the risks and benefits. He has consented to treatment. He has also consented to voluntary admission.   Psychiatric: Schizophrenia paranoid type [facility])  Medical:  Psychosocial:  Unemployed  PLAN: 1. Trial of Abilify. Would titrate rapidly to an antipsychotic dose.  2. Encourage unit groups and activities 3. Monitor mood, behavior and interaction with peers 4. Collateral from his family 59. SW facilitate aftercare.    Observation Level/Precautions:  15 minute checks  Laboratory:  Psychotherapy:    Medications:    Consultations:    Discharge Concerns:    Estimated LOS:  Other:     Physician Treatment Plan for Primary Diagnosis: <principal problem not specified> Long Term Goal(s): Improvement in symptoms so as ready for discharge  Short Term Goals: Ability to identify changes in lifestyle to reduce recurrence of condition will improve, Ability to verbalize feelings will improve, Ability to disclose and discuss suicidal ideas, Ability to demonstrate self-control will improve, Ability to identify and develop effective coping behaviors will improve, Ability to maintain clinical measurements within normal limits will improve, Compliance with prescribed medications will improve and Ability to identify triggers associated with substance abuse/mental health issues will improve  Physician Treatment Plan for Secondary Diagnosis: Active Problems:   Substance induced mood disorder ([place])  Long Term Goal(s): Improvement in symptoms so as ready for discharge  Short Term  Goals: Ability to identify changes in lifestyle to reduce recurrence of condition will improve, Ability to verbalize feelings will improve, Ability to disclose and discuss suicidal ideas, Ability to demonstrate self-control will improve, Ability to identify and develop effective coping behaviors will improve, Ability to maintain clinical measurements within normal limits will improve, Compliance with prescribed medications will improve and Ability to identify triggers associated with substance abuse/mental health issues will improve  I certify that inpatient services furnished can reasonably be expected to improve the patient's condition.    [place], MD 5/10/201812:11 PM

## 2016-09-06 NOTE — Progress Notes (Signed)
D:Pt reports that he feels people talking about him and hears voices. He says that it is worse when he is around many people. Pt is guarded at times. He c/o headache with chest pain. MD aware and tylenol given.  A:Offered support and 15 minute checks. R:Pt denies si and hi. Safety maintained on the unit.

## 2016-09-06 NOTE — Progress Notes (Signed)
Report received from admitting RN.  Introduced self to pt.  Pt presents with anxious affect and mood.  Pt appears paranoid, looking around the room while speaking with Clinical research associatewriter.  Pt denies SI/HI, denies hallucinations, reports pain from headache of 4/10.  PRN medication administered for pain and anxiety.  Pt verbally contracts for safety.  He is safe on the unit.  Will continue to monitor and assess.

## 2016-09-06 NOTE — [facility] Group Notes (Signed)
[facility]  09/06/2016 , 12:56 PM    Type of Therapy:  [facility] Presentation  Participation Level:  Active  Participation Quality:  Attentive  Affect:  Blunted  Cognitive:  Oriented  Insight:  Limited  Engagement in Therapy:  Engaged  Modes of Intervention:  Discussion, Education and Socialization  Summary of Progress/Problems:  Shane HuaDavid from [facility] came to present his recovery story and play the guitar.  Stayed the entire time,engaged throughout.  Shane Small, Shane Small B 09/06/2016 , 12:56 PM

## 2016-09-06 NOTE — Progress Notes (Signed)
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  Coping Skills:   Isolate, Arguments, Substance Abuse, Avoidance, Self-Injury, Exercise, Talking, Music, Sports  Personal Challenges: Anger, Concentration, Decision-Making, Problem-Solving, Stress Management  Leisure Interests (2+):  Individual - Other (Comment) (Sleep, eat)  Awareness of Community Resources:  Yes  Community Resources:  Gym, Park, Engineering geologistLibrary, Public affairs consultantestaurants, Other (Comment) Landscape architect(Gas station)  Current Use: No  If no, Barriers?: People  Patient Strengths:  Perserverance, no attitude issues  Patient Identified Areas of Improvement:  Stress management, communication with daughter's mother  Current Recreation Participation:  Rarely  Patient Goal for Hospitalization:  "I don't know.  My dad wanted me to talk to someone about suicide, paranoia and stuff like that".  [place]  [place] Residence:  [place]  Current [place] (including self-harm):  No  Current HI:  No  Consent to Intern Participation: N/A   Caroll RancherMarjette Juancarlos Crescenzo, LRT/CTRS  Caroll RancherLindsay, Sedonia Kitner A 09/06/2016, 12:35 PM

## 2016-09-06 NOTE — Tx Team (Signed)
Interdisciplinary Treatment and Diagnostic Plan Update  09/06/2016 Time of Session: 1:49 PM  Shane Small MRN: [id number]  Principal Diagnosis: Schizophrenia, paranoid [facility])  Secondary Diagnoses: Principal Problem:   Schizophrenia, paranoid ([place]) Active Problems:   Substance induced mood disorder ([place])   Current Medications:  Current Facility-Administered Medications  Medication Dose Route Frequency Provider Last Rate Last Dose  . acetaminophen (TYLENOL) tablet 650 mg  650 mg Oral Q6H PRN Kerrie Buffalo, NP   650 mg at 09/05/16 2154  . albuterol (PROVENTIL HFA;VENTOLIN HFA) 108 (90 Base) MCG/ACT inhaler 2 puff  2 puff Inhalation Q6H PRN Laverle Hobby, PA-C      . alum & mag hydroxide-simeth (MAALOX/MYLANTA) 200-200-20 MG/5ML suspension 30 mL  30 mL Oral Q4H PRN Kerrie Buffalo, NP      . ARIPiprazole (ABILIFY) tablet 10 mg  10 mg Oral Once Artist Beach, MD      . Derrill Memo ON 09/07/2016] ARIPiprazole (ABILIFY) tablet 20 mg  20 mg Oral Daily Izediuno, Vincent A, MD      . ibuprofen (ADVIL,MOTRIN) tablet 600 mg  600 mg Oral Q8H PRN Kerrie Buffalo, NP      . LORazepam (ATIVAN) tablet 1 mg  1 mg Oral Q8H PRN Kerrie Buffalo, NP   1 mg at 09/05/16 2154  . magnesium hydroxide (MILK OF MAGNESIA) suspension 30 mL  30 mL Oral Daily PRN Kerrie Buffalo, NP      . nicotine (NICODERM CQ - dosed in mg/24 hours) patch 21 mg  21 mg Transdermal Daily Kerrie Buffalo, NP   21 mg at 09/06/16 0810  . ondansetron (ZOFRAN) tablet 4 mg  4 mg Oral Q8H PRN Kerrie Buffalo, NP      . traZODone (DESYREL) tablet 50 mg  50 mg Oral QHS PRN Kerrie Buffalo, NP        PTA Medications: Prescriptions Prior to Admission  Medication Sig Dispense Refill Last Dose  . albuterol (PROVENTIL HFA;VENTOLIN HFA) 108 (90 Base) MCG/ACT inhaler Inhale 2 puffs into the lungs every 4 (four) hours as needed for wheezing or shortness of breath. 1 Inhaler 0 09/03/2016 at Unknown time  . OVER THE COUNTER MEDICATION Take 1-2  tablets by mouth daily as needed (allergies, runny nose). OTC allergy medication.   09/03/2016 at Unknown time  . OVER THE COUNTER MEDICATION Place 1 drop into both eyes 4 (four) times daily as needed (dry eyes). OTC eye drops.   Unknown at Unknown time    Treatment Modalities: Medication Management, Group therapy, Case management,  1 to 1 session with clinician, Psychoeducation, Recreational therapy.   Physician Treatment Plan for Primary Diagnosis: Schizophrenia, paranoid ([place]) Long Term Goal(s): Improvement in symptoms so as ready for discharge  Short Term Goals: Ability to identify changes in lifestyle to reduce recurrence of condition will improve Ability to verbalize feelings will improve Ability to disclose and discuss suicidal ideas Ability to demonstrate self-control will improve Ability to identify and develop effective coping behaviors will improve Ability to maintain clinical measurements within normal limits will improve Compliance with prescribed medications will improve Ability to identify triggers associated with substance abuse/mental health issues will improve Ability to identify changes in lifestyle to reduce recurrence of condition will improve Ability to verbalize feelings will improve Ability to disclose and discuss suicidal ideas Ability to demonstrate self-control will improve Ability to identify and develop effective coping behaviors will improve Ability to maintain clinical measurements within normal limits will improve Compliance with prescribed medications will improve Ability to identify  triggers associated with substance abuse/mental health issues will improve  Medication Management: Evaluate patient's response, side effects, and tolerance of medication regimen.  Therapeutic Interventions: 1 to 1 sessions, Unit Group sessions and Medication administration.  Evaluation of Outcomes: Progressing  Physician Treatment Plan for Secondary Diagnosis: Principal  Problem:   Schizophrenia, paranoid ([place]) Active Problems:   Substance induced mood disorder ([place])   Long Term Goal(s): Improvement in symptoms so as ready for discharge  Short Term Goals: Ability to identify changes in lifestyle to reduce recurrence of condition will improve Ability to verbalize feelings will improve Ability to disclose and discuss suicidal ideas Ability to demonstrate self-control will improve Ability to identify and develop effective coping behaviors will improve Ability to maintain clinical measurements within normal limits will improve Compliance with prescribed medications will improve Ability to identify triggers associated with substance abuse/mental health issues will improve Ability to identify changes in lifestyle to reduce recurrence of condition will improve Ability to verbalize feelings will improve Ability to disclose and discuss suicidal ideas Ability to demonstrate self-control will improve Ability to identify and develop effective coping behaviors will improve Ability to maintain clinical measurements within normal limits will improve Compliance with prescribed medications will improve Ability to identify triggers associated with substance abuse/mental health issues will improve  Medication Management: Evaluate patient's response, side effects, and tolerance of medication regimen.  Therapeutic Interventions: 1 to 1 sessions, Unit Group sessions and Medication administration.  Evaluation of Outcomes: Progressing   RN Treatment Plan for Primary Diagnosis: Schizophrenia, paranoid ([place]) Long Term Goal(s): Knowledge of disease and therapeutic regimen to maintain health will improve  Short Term Goals: Ability to identify and develop effective coping behaviors will improve and Compliance with prescribed medications will improve  Medication Management: RN will administer medications as ordered by provider, will assess and evaluate patient's response and  provide education to patient for prescribed medication. RN will report any adverse and/or side effects to prescribing provider.  Therapeutic Interventions: 1 on 1 counseling sessions, Psychoeducation, Medication administration, Evaluate responses to treatment, Monitor vital signs and CBGs as ordered, Perform/monitor CIWA, COWS, AIMS and Fall Risk screenings as ordered, Perform wound care treatments as ordered.  Evaluation of Outcomes: Progressing   LCSW Treatment Plan for Primary Diagnosis: Schizophrenia, paranoid ([place]) Long Term Goal(s): Safe transition to appropriate next level of care at discharge, Engage patient in therapeutic group addressing interpersonal concerns.  Short Term Goals: Engage patient in aftercare planning with referrals and resources  Therapeutic Interventions: Assess for all discharge needs, 1 to 1 time with Social worker, Explore available resources and support systems, Assess for adequacy in community support network, Educate family and significant other(s) on suicide prevention, Complete Psychosocial Assessment, Interpersonal group therapy.  Evaluation of Outcomes: Met  Return home, follow up [facility]   Progress in Treatment: Attending groups: Yes Participating in groups: Yes Taking medication as prescribed: Yes Toleration medication: Yes, no side effects reported at this time Family/Significant other contact made: No Patient understands diagnosis: No  Limited insight Discussing patient identified problems/goals with staff: Yes Medical problems stabilized or resolved: Yes Denies suicidal/homicidal ideation: Yes Issues/concerns per patient self-inventory: None Other: N/A  New problem(s) identified: None identified at this time.   New Short Term/Long Term Goal(s): None identified at this time.   Discharge Plan or Barriers:   Reason for Continuation of Hospitalization: Delusions  Paranoia Hallucinations Medication stabilization   Estimated Length of  Stay: 3-5 days  Attendees: Patient: 09/06/2016  1:49 PM  Physician: Marchelle Folks, MD  09/06/2016  1:49 PM  Nursing: Hoy Register, RN 09/06/2016  1:49 PM  RN Care Manager: Lars Pinks, RN 09/06/2016  1:49 PM  Social Worker: Ripley Fraise 09/06/2016  1:49 PM  Recreational Therapist: Marjette  09/06/2016  1:49 PM  Other: Norberto Sorenson 09/06/2016  1:49 PM  Other:  09/06/2016  1:49 PM    Scribe for Treatment Team:  Roque Lias LCSW 09/06/2016 1:49 PM

## 2016-09-06 NOTE — Progress Notes (Signed)
Recreation Therapy Notes  Date: 09/06/16 Time: 1000 Location: 300 Hall Dayroom  Group Topic: Coping Skills  Goal Area(s) Addresses:  Patient will be able to positive coping skills. Patient will be able to identify benefits of using positive coping skills. Patient will be able to benefits of using coping skills post d/c.  Behavioral Response: Engaged  Intervention: Mindmap, pencils  Activity: Mindmap.  Patients were given a blank mindmap.  Patients and LRT filled in the first eight boxes together.  Patients were to then identify three coping skills for each of the stressors identified by the patients and LRT.  Once patients identified their coping skills, LRT would fill in the coping skills on the board so patients could fill in any blank spaces they may have had on their sheets.  Education: PharmacologistCoping Skills, Building control surveyorDischarge Planning.   Education Outcome: Acknowledges understanding/In group clarification offered/Needs additional education.   Clinical Observations/Feedback: Pt was quiet but participated.  Pt identified his coping skills as video games and time management for school; mediator for friends/family; affirmations for self esteem and communication for relationships.   Caroll RancherMarjette Deshan Hemmelgarn, LRT/CTRS         Lillia AbedLindsay, Itzayanna Kaster A 09/06/2016 11:59 AM
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
## 2016-09-07 DIAGNOSIS — G4700 Insomnia, unspecified: Secondary | ICD-10-CM

## 2016-09-07 DIAGNOSIS — R45851 Suicidal ideations: Secondary | ICD-10-CM

## 2016-09-07 DIAGNOSIS — F39 Unspecified mood [affective] disorder: Secondary | ICD-10-CM

## 2016-09-07 DIAGNOSIS — F419 Anxiety disorder, unspecified: Secondary | ICD-10-CM

## 2016-09-07 DIAGNOSIS — F1220 Cannabis dependence, uncomplicated: Secondary | ICD-10-CM

## 2016-09-07 DIAGNOSIS — F329 Major depressive disorder, single episode, unspecified: Secondary | ICD-10-CM

## 2016-09-07 NOTE — Progress Notes (Signed)
Adult Psychoeducational Group Note  Date:  09/07/2016 Time:  3:58 AM  Group Topic/Focus:  Wrap-Up Group:   The focus of this group is to help patients review their daily goal of treatment and discuss progress on daily workbooks.  Participation Level:  Did Not Attend  Additional Comments: Pt did not attend group, pt encourage to come group. Karleen HampshireFox, Phuong Moffatt Brittini 09/07/2016, 3:58 AM

## 2016-09-07 NOTE — Progress Notes (Signed)
Recreation Therapy Notes  Date: 09/07/16 Time: 1000 Location: 300 Hall Dayroom  Group Topic: Stress Management  Goal Area(s) Addresses:  Patient will verbalize importance of using healthy stress management.  Patient will identify positive emotions associated with healthy stress management.   Intervention: Stress Management  Activity :  Peaceful Waves, Music.  LRT introduced the stress management techniques of guided imagery and music.  Patients were to listen and follow along as LRT read script to participate in guided imagery.  Patients also listened to music, socialized and moved along with the music.  Education:  Stress Management, Discharge Planning.   Education Outcome: Acknowledges edcuation/In group clarification offered/Needs additional education  Clinical Observations/Feedback: Pt did not attend group.   Shane Small, Shane Small         Shane RancherLindsay, Murdock Jellison A 09/07/2016 11:33 AM

## 2016-09-07 NOTE — B[facility] Suicide Risk Assessment (Signed)
B[facility] INPATIENT:  Family/Significant Other Suicide Prevention Education  Suicide Prevention Education:  Education Completed; No one has been identified by the patient as the family member/significant other with whom the patient will be residing, and identified as the person(s) who will aid the patient in the event of a mental health crisis (suicidal ideations/suicide attempt).  With written consent from the patient, the family member/significant other has been provided the following suicide prevention education, prior to the and/or following the discharge of the patient.  The suicide prevention education provided includes the following:  Suicide risk factors  Suicide prevention and interventions  National Suicide Hotline telephone number  [facility] assessment telephone number  [facility] Emergency Assistance 911  [facility] and/or Residential Mobile Crisis Unit telephone number  Request made of family/significant other to:  Remove weapons (e.g., guns, rifles, knives), all items previously/currently identified as safety concern.    Remove drugs/medications (over-the-counter, prescriptions, illicit drugs), all items previously/currently identified as a safety concern.  The family member/significant other verbalizes understanding of the suicide prevention education information provided.  The family member/significant other agrees to remove the items of safety concern listed above. The patient did not endorse SI at the time of admission, nor did the patient c/o SI during the stay here.  SPE not required. However, I did talk to father, Shane Small, [phone], and went over treatment team recommendations and crises plan with him.  Shane Small 09/07/2016, 2:59 PM

## 2016-09-07 NOTE — BHH Group Notes (Signed)
B[facility] LCSW Group Therapy  09/07/2016  1:05 PM  Type of Therapy:  Group therapy  Participation Level:  Active  Participation Quality:  Attentive  Affect:  Flat  Cognitive:  Oriented  Insight:  Limited  Engagement in Therapy:  Limited  Modes of Intervention:  Discussion, Socialization  Summary of Progress/Problems:  Chaplain was here to lead a group on themes of hope and courage. "Courage is the will to address the problems that appear before us. I feel like I have a lot of problems right now, and I don't feel like I am getting the help I am looking for. I'm worried about a getting a job, and I do feel like the medication is helping a bit. I gotta quit setting around the house and go after it."  Cited his father as a good role model and good support.  Daryel Geraldorth, Khadir Roam B 09/07/2016 11:18 AM

## 2016-09-07 NOTE — Progress Notes (Signed)
[facility]  09/07/2016 3:23 PM Arnoldo MoraleDerrick L Armistead  MRN:  [id number]  Subjective: [name] reports, "I'm not doing okay today. It is hard to explain. I came here because people thought that I was paranoid. I just don't know sometimes".  Objective: [name] is seen, chart reviewed. He is alert, quiet, not making any eye contact. He is verbally responsible, however, speaks in a monotonic voice. He is vague about his symptoms. He says people thought that he was paranoid & he does not know sometimes. He is visible on the unit, seen keeping to himself. He  goes in out of the day room, attends some groups. Documentation indicated he is sleeping at night. He is cooperative with staff, no disruptive behavior on the unit. Today, [name] is not endorsing or denying any SIHI, AVH, delusional thoughts or paranoia. He presents as if he is responding to some internal stimuli. He is tolerating his treatment regimen.e effects reported.  Principal Problem: Schizophrenia, paranoid (HCC)  Diagnosis:   Patient Active Problem List   Diagnosis Date Noted  . Schizophrenia, paranoid (HCC) [F20.0] 09/06/2016  . Substance induced mood disorder (HCC) [F19.94] 09/05/2016   Total Time spent with patient: 25 minutes  Past Psychiatric History: See H&P  Past Medical History:  Past Medical History:  Diagnosis Date  . Asthma    History reviewed. No pertinent surgical history. Family History: History reviewed. No pertinent family history.  Family Psychiatric  History: See H&P  Social History:  History  Alcohol Use  . Yes    Comment: occasionally     History  Drug Use No    Social History   Social History  . Marital status: Single    Spouse name: N/A  . Number of children: N/A  . Years of education: N/A   Social History Main Topics  . Smoking status: Current Some Day Smoker  . Smokeless tobacco: Former NeurosurgeonUser  . Alcohol use Yes     Comment: occasionally  . Drug use: No  . Sexual activity: Not  Asked   Other Topics Concern  . None   Social History Narrative  . None   Additional Social History:   Sleep: Good  Appetite:  Good  Current Medications: Current Facility-Administered Medications  Medication Dose Route Frequency Provider Last Rate Last Dose  . acetaminophen (TYLENOL) tablet 650 mg  650 mg Oral Q6H PRN Adonis BrookAgustin, Sheila, NP   650 mg at 09/06/16 1446  . albuterol (PROVENTIL HFA;VENTOLIN HFA) 108 (90 Base) MCG/ACT inhaler 2 puff  2 puff Inhalation Q6H PRN Kerry HoughSimon, Spencer E, PA-C   2 puff at 09/06/16 2008  . alum & mag hydroxide-simeth (MAALOX/MYLANTA) 200-200-20 MG/5ML suspension 30 mL  30 mL Oral Q4H PRN Adonis BrookAgustin, Sheila, NP      . ARIPiprazole (ABILIFY) tablet 20 mg  20 mg Oral Daily Izediuno, Delight OvensVincent A, MD   20 mg at 09/07/16 0921  . ibuprofen (ADVIL,MOTRIN) tablet 600 mg  600 mg Oral Q8H PRN Adonis BrookAgustin, Sheila, NP      . LORazepam (ATIVAN) tablet 1 mg  1 mg Oral Q8H PRN Adonis BrookAgustin, Sheila, NP   1 mg at 09/05/16 2154  . magnesium hydroxide (MILK OF MAGNESIA) suspension 30 mL  30 mL Oral Daily PRN Adonis BrookAgustin, Sheila, NP      . ondansetron [facility](ZOFRAN) tablet 4 mg  4 mg Oral Q8H PRN Adonis BrookAgustin, Sheila, NP      . traZODone (DESYREL) tablet 50 mg  50 mg Oral QHS PRN Adonis BrookAgustin, Sheila, NP  Lab Results: No results found for this or any previous visit (from the past 48 hour(s)).  Blood Alcohol level:  Lab Results  Component Value Date   ETH <5 09/04/2016   Metabolic Disorder Labs: No results found for: HGBA1C, MPG No results found for: PROLACTIN No results found for: CHOL, TRIG, HDL, CHOLHDL, VLDL, LDLCALC  Physical Findings: AIMS: Facial and Oral Movements Muscles of Facial Expression: None, normal Lips and Perioral Area: None, normal Jaw: None, normal Tongue: None, normal,Extremity Movements Upper (arms, wrists, hands, fingers): None, normal Lower (legs, knees, ankles, toes): None, normal, Trunk Movements Neck, shoulders, hips: None, normal, Overall Severity Severity of  abnormal movements (highest score from questions above): None, normal Incapacitation due to abnormal movements: None, normal Patient's awareness of abnormal movements (rate only patient's report): No Awareness, Dental Status Current problems with teeth and/or dentures?: No Does patient usually wear dentures?: No  CIWA:    COWS:     Musculoskeletal: Strength & Muscle Tone: within normal limits Gait & Station: normal Patient leans: N/A  Psychiatric Specialty Exam: Physical Exam  Review of Systems  Psychiatric/Behavioral: Positive for depression, hallucinations (Hx. psychosis) and substance abuse (Hx. substance use disorder). Negative for memory loss and suicidal ideas. The patient is nervous/anxious. The patient does not have insomnia.     Blood pressure 127/78, pulse 87, temperature 97.7 F (36.5 C), temperature source Oral, resp. rate 16, height 5\' 11"  (1.803 m), weight 65.3 kg (144 lb).Body mass index is 20.08 kg/m.  General Appearance: In hospital clothing. Poor personal hygiene. Guarded. Odd mannerism.   Eye Contact:  Fair  Speech:  Clear and Coherent and Normal Rate  Volume:  Normal  Mood:  Overwhelmed by his subjective experience  Affect:  Flat  Thought Process:  Linear  Orientation:  Full (Time, Place, and Person)  Thought Content:  Paranoid and persecutory delusion. Ideas of reference. Auditory and tactile hallucinations.   Suicidal Thoughts:  No  Homicidal Thoughts:  Yes.  without intent/plan  Memory:  Immediate;   Fair Recent;   Fair Remote;   Fair  Judgement:  Impaired  Insight:  Partial  Psychomotor Activity:  Decreased  Concentration:  Concentration: Fair and Attention Span: Fair  Recall:  [employer] of Knowledge:  Fair  Language:  Good  Akathisia:  No  Handed:    AIMS (if indicated):     Assets:  Desire for Improvement Housing  ADL's:  Impaired  Cognition:  WNL  Sleep:  Number of Hours: 6.75     Assessment: (Per H&P): 31 yo AAM, single, unemployed,  lives with his parents. No past history of mental illness. Presented to the ER in company of his father. Repeated ER visits on account of somatic complaints. Reports tactile hallucinations in his extremities. Has been expressing paranoid delusions about being poisoned at [employer] years ago. Believes that the water was contaminated with E. Coli and radiation. Has beliefs that this is what is causing his tactile hallucinations. Reports ideas of reference from the TV for the past couple of weeks. Reported to abuse cocaine and THC though UDS was negative at presentation. Drove dangerously to [employer] based on his delusions. Was booked for dangerous driving.  Treatment Plan Summary:  Daily contact with patient to assess and evaluate symptoms and progress in treatment and Medication management  Reviewed past medical records & treatment plan.   For Depressed mood/anxiety: Will continue Abilify 20 mg po daily. Lorazepam 1 mg Q 8 hours prn.  For Mood control:  Will continue Abilify 20 mg daily.  For insomnia: Will continue Trazodone 50 mg po qhs prn.  Other medical issues & concerns: Will continue continue home medications as recommended.  - Continue 15 minutes observation for safety concerns - Encouraged to participate in milieu therapy and group therapy counseling sessions and also work with coping skills -  Develop treatment plan to reduce the need for readmission. -  Psycho-social education regarding self care. - Health care follow up as needed for medical problems. - Restart home medications where appropriate.  Sanjuana Kava, NP, PMHNP, FNP-BC 09/07/2016, 3:23 PM

## 2016-09-07 NOTE — Progress Notes (Signed)
Pt continues to be cautious and paranoid.  He has stayed mostly in his room, coming out for a short time during the evening.  Pt used his inhaler this evening stating that he usually uses it at night before going to bed.  He declined a sleep aid, stating that he did not need one.  He denies SI/HI/AVH at this time.  He voiced no other needs or concerns.  Conversation with pt was minimal.  Support and encouragement was offered.  Pt was encouraged to make his needs known to staff.  Discharge plans are in process.  Safety maintained with q15 minute checks.

## 2016-09-07 NOTE — Progress Notes (Signed)
Nursing Note 09/07/2016 [id number]  Data Reports sleeping poor without PRN sleep med.  Rates depression 6/10, hopelessness 10/10, and anxiety 6/10. Affect flat.  Denies HI, SI, AVH.  Patient guarded and forwards little.  Kept to self most of shift.  Did not attend morning group but did attend afternoon group.  Spends free time in room in bed.   Action Spoke with patient 1:1, nurse offered support to patient throughout shift.  Continues to be monitored on 15 minute checks for safety.  Response Remains safe on unit though guarded and isolative.

## 2016-09-08 NOTE — Progress Notes (Signed)
[facility]  09/08/2016 2:40 PM Shane MoraleDerrick L Holwerda  MRN:  [id number]  Subjective: patient reports " I still feel as if people are after me. states " I am always paranoid."  Objective: Shane MoraleDerrick L Cromartie is awake, alert and oriented *3. Seen resting in bedroom. Patient present with a flat and guarded affect.   Denies suicidal or homicidal ideation during this assessment. Reports some auditory hallucination. (voice that are mumble) denies visual hallucination and does not appear to be responding to internal stimuli.  However does endorse paranoid ideations. Patient reports he is medication, states he dose feel nauseated at times. Patient is inquiring about his discharge. Support, encouragement and reassurance was provided.   Principal Problem: Schizophrenia, paranoid (HCC)  Diagnosis:   Patient Active Problem List   Diagnosis Date Noted  . Schizophrenia, paranoid (HCC) [F20.0] 09/06/2016  . Substance induced mood disorder (HCC) [F19.94] 09/05/2016   Total Time spent with patient: 25 minutes  Past Psychiatric History: See H&P  Past Medical History:  Past Medical History:  Diagnosis Date  . Asthma    History reviewed. No pertinent surgical history. Family History: History reviewed. No pertinent family history.  Family Psychiatric  History: See H&P  Social History:  History  Alcohol Use  . Yes    Comment: occasionally     History  Drug Use No    Social History   Social History  . Marital status: Single    Spouse name: N/A  . Number of children: N/A  . Years of education: N/A   Social History Main Topics  . Smoking status: Current Some Day Smoker  . Smokeless tobacco: Former NeurosurgeonUser  . Alcohol use Yes     Comment: occasionally  . Drug use: No  . Sexual activity: Not Asked   Other Topics Concern  . None   Social History Narrative  . None   Additional Social History:   Sleep: Good  Appetite:  Good  Current Medications: Current Facility-Administered  Medications  Medication Dose Route Frequency Provider Last Rate Last Dose  . acetaminophen (TYLENOL) tablet 650 mg  650 mg Oral Q6H PRN Adonis BrookAgustin, Sheila, NP   650 mg at 09/06/16 1446  . albuterol (PROVENTIL HFA;VENTOLIN HFA) 108 (90 Base) MCG/ACT inhaler 2 puff  2 puff Inhalation Q6H PRN Kerry HoughSimon, Spencer E, PA-C   2 puff at 09/07/16 2116  . alum & mag hydroxide-simeth (MAALOX/MYLANTA) 200-200-20 MG/5ML suspension 30 mL  30 mL Oral Q4H PRN Adonis BrookAgustin, Sheila, NP      . ARIPiprazole (ABILIFY) tablet 20 mg  20 mg Oral Daily Izediuno, Delight OvensVincent A, MD   20 mg at 09/08/16 0808  . ibuprofen (ADVIL,MOTRIN) tablet 600 mg  600 mg Oral Q8H PRN Adonis BrookAgustin, Sheila, NP      . LORazepam (ATIVAN) tablet 1 mg  1 mg Oral Q8H PRN Adonis BrookAgustin, Sheila, NP   1 mg at 09/05/16 2154  . magnesium hydroxide (MILK OF MAGNESIA) suspension 30 mL  30 mL Oral Daily PRN Adonis BrookAgustin, Sheila, NP      . ondansetron [facility](ZOFRAN) tablet 4 mg  4 mg Oral Q8H PRN Adonis BrookAgustin, Sheila, NP      . traZODone (DESYREL) tablet 50 mg  50 mg Oral QHS PRN Adonis BrookAgustin, Sheila, NP        Lab Results: No results found for this or any previous visit (from the past 48 hour(s)).  Blood Alcohol level:  Lab Results  Component Value Date   ETH <5 09/04/2016   Metabolic Disorder Labs:  No results found for: HGBA1C, MPG No results found for: PROLACTIN No results found for: CHOL, TRIG, HDL, CHOLHDL, VLDL, LDLCALC  Physical Findings: AIMS: Facial and Oral Movements Muscles of Facial Expression: None, normal Lips and Perioral Area: None, normal Jaw: None, normal Tongue: None, normal,Extremity Movements Upper (arms, wrists, hands, fingers): None, normal Lower (legs, knees, ankles, toes): None, normal, Trunk Movements Neck, shoulders, hips: None, normal, Overall Severity Severity of abnormal movements (highest score from questions above): None, normal Incapacitation due to abnormal movements: None, normal Patient's awareness of abnormal movements (rate only patient's report):  No Awareness, Dental Status Current problems with teeth and/or dentures?: No Does patient usually wear dentures?: No  CIWA:    COWS:     Musculoskeletal: Strength & Muscle Tone: within normal limits Gait & Station: normal Patient leans: N/A  Psychiatric Specialty Exam: Physical Exam  Vitals reviewed. Constitutional: He is oriented to person, place, and time. He appears well-developed.  Cardiovascular: Normal rate.   Neurological: He is alert and oriented to person, place, and time.  Psychiatric: He has a normal mood and affect. His behavior is normal.    Review of Systems  Psychiatric/Behavioral: Positive for hallucinations (Hx. psychosis) and substance abuse (Hx. substance use disorder). Negative for suicidal ideas. The patient is nervous/anxious.     Blood pressure 114/89, pulse 92, temperature 97.8 F (36.6 C), temperature source Oral, resp. rate 16, height 5\' 11"  (1.803 m), weight 65.3 kg (144 lb).Body mass index is 20.08 kg/m.  General Appearance:casual, pleasant but guarded  Eye Contact:  Fair  Speech:  Clear and Coherent and Normal Rate  Volume:  Normal  Mood: dysphoric, flat  Affect:  Flat  Thought Process:  Linear  Orientation:  Full (Time, Place, and Person)  Thought Content:  Paranoid. Auditory  hallucinations.   Suicidal Thoughts:  No  Homicidal Thoughts:  Denies   Memory:  Immediate;   Fair Recent;   Fair Remote;   Fair  Judgement:  Impaired  Insight:  Partial  Psychomotor Activity:  Decreased  Concentration:  Concentration: Fair and Attention Span: Fair  Recall:  [employer] of Knowledge:  Fair  Language:  Good  Akathisia:  No  Handed:    AIMS (if indicated):     Assets:  Desire for Improvement Housing  ADL's:  Impaired  Cognition:  WNL  Sleep:  Number of Hours: 6.75       I agree with current treatment plan on 09/08/2016, Patient seen face-to-face for psychiatric evaluation follow-up, chart reviewed. Reviewed the information documented and agree  with the treatment plan.  Treatment Plan Summary:  Daily contact with patient to assess and evaluate symptoms and progress in treatment and Medication management  Reviewed past medical records & treatment plan.  Continue current treatment plan except where noted 09/08/2016  For Depressed mood/anxiety: Will continue Abilify 20 mg po daily. Lorazepam 1 mg Q 8 hours prn.  For Mood control: Will continue Abilify 20 mg daily.  For insomnia: Will continue Trazodone 50 mg po qhs prn.  Other medical issues & concerns: Will continue home medications as recommended.  - Continue 15 minutes observation for safety concerns - Encouraged to participate in milieu therapy and group therapy counseling sessions and also work with coping skills -  Develop treatment plan to reduce the need for readmission. -  Psycho-social education regarding self care. - Health care follow up as needed for medical problems. - Restart home medications where appropriate.  Oneta Rack, NP 09/08/2016, 2:40 PM

## 2016-09-08 NOTE — Progress Notes (Signed)
[facility] Group Notes:  (Nursing/MHT/Case Management/Adjunct)  Date:  09/08/2016  Time:  2030  Type of Therapy:  wrap up group  Participation Level:  Active  Participation Quality:  Appropriate  Affect:  Flat  Cognitive:  Appropriate  Insight:  Appropriate and Improving  Engagement in Group:  Engaged  Modes of Intervention:  Clarification, Education and Support  Summary of Progress/Problems: Pt reports happy that he has a book he enjoys reading. Pt shared that if he could change any one thing it would be his high school grades. Pt reports plans to return to his parents house,"its a good place to sleep" and is grateful for his parents and brother.   Shane Small, Shane Small S 09/08/2016, 11:03 PM

## 2016-09-08 NOTE — Progress Notes (Signed)
DAR NOTE: Patient presents with anxious affect and depressed mood.  Denies pain, auditory and visual hallucinations.  Reports prescribed medication causing side effect.  MD made aware.  Described energy level as low and concentration as good.  Rates depression at 7, hopelessness at 6, and anxiety at 6.  Maintained on routine safety checks.  Medications given as prescribed.  Support and encouragement offered as needed.  Patient remained in his room most of this shift.  No interaction with staff or peers.  Patient is safe on the unit.

## 2016-09-08 NOTE — Progress Notes (Signed)
Pt has been out a little more this evening than last night.  He reports he is doing ok.  He still stays to himself and has little interaction with other patients.  Pt denies SI/HI/AVH.  He has been cooperative with staff and went to group this evening.  He forwards little, and was encouraged to make his needs known.  Support and encouragement offered.  Discharge plans are in process.  Safety maintained with q15 minute checks.

## 2016-09-08 NOTE — Progress Notes (Signed)
Adult Psychoeducational Group Note  Date:  09/08/2016 Time:  4:47 AM  Group Topic/Focus:  Wrap-Up Group:   The focus of this group is to help patients review their daily goal of treatment and discuss progress on daily workbooks.  Participation Level:  Did Not Attend  Participation Quality:  Patient did not attend  Affect:  Patient did not attend  Cognitive:  Patient did not attend  Insight: None  Engagement in Group:  Patient did not attend  Modes of Intervention:  Patient did not attend  Additional Comments:  Patient did not attend evening wrap up group because he attended his AA meeting group and he acted appropriate.  Felipa FurnaceChristopher  Maddelyn Rocca 09/08/2016, 4:47 AM

## 2016-09-09 MED ORDER — ARIPIPRAZOLE 5 MG PO TABS
5.0000 mg | ORAL_TABLET | Freq: Once | ORAL | Status: AC
  Administered 2016-09-09: 5 mg via ORAL
  Filled 2016-09-09 (×2): qty 1

## 2016-09-09 MED ORDER — ARIPIPRAZOLE 15 MG PO TABS
30.0000 mg | ORAL_TABLET | Freq: Every day | ORAL | Status: DC
  Administered 2016-09-10: 30 mg via ORAL
  Filled 2016-09-09 (×2): qty 2

## 2016-09-09 NOTE — Progress Notes (Signed)
Nursing Progress Note: 7p-7a D: Pt currently presents with a depressed/anxious affect and behavior. Pt states "I am having chest pain on my left side and my right two index and middle fingers have been going numb." Vital within defined limits and were recorded. NP was consulted and proceeded with no new orders. Pt states, "I want to quit all my meds to see if I actually do here voices. I'm not so sure I do hear voices." Interacting appropriately with milieu. Pt reports off and on sleep with current medication regimen.   A: Pt provided with medications per providers orders. Pt's labs and vitals were monitored throughout the night. Pt supported emotionally and encouraged to express concerns and questions. Pt educated on medications.  R: Pt's safety ensured with 15 minute and environmental checks. Pt currently denies SI/HI/Self Harm and AVH. Pt verbally contracts to seek staff if SI/HI or A/VH occurs and to consult with staff before acting on any harmful thoughts. Will continue to monitor.

## 2016-09-09 NOTE — Progress Notes (Signed)
[facility]  09/09/2016 11:52 AM Shane Small  MRN:  [id number] Subjective:  31 yo AAM, single, unemployed, lives with his parents. No past history of mental illness. Presented to the ER in company of his father. Repeated ER visits on account of somatic complaints. Reports tactile hallucinations in his extremities. Has been expressing paranoid delusions about being poisoned at [employer] [facility] years ago. Believes that the water was contaminated with E. Coli and radiation. Has beliefs that this is what is causing his tactile hallucinations. Reports ideas of reference from the TV for the past couple of weeks. Reported to abuse cocaine and THC though UDS was negative at presentation. Drove dangerously to [employer] based on his delusions. Was booked for dangerous driving.  Father is very concerned as he has been very bizarre. Father is concerned that he might be potentially homicidal.  Chart reviewed. Patient discussed at team.   Staff reports that he is grooming self. He comes out but quiet. Reports hearing voices. Has expressed paranoia lately.  Has not been observed to be internally distracted. No behavioral issues. Minimal participation at roups. Limited sleep as he is watchful. Has not voiced any violent thoughts.   Seen today. Says he feels better mentally. The voices are quieter. They mumble about people outside. No command to hurt self. Does not feel as if he is in danger here. Does not feel as if people on the outside is monitoring him. Still somatically preoccupied. Tells me that [facility] and [facility]. Says he had work up for E. Coli and veneral diseases. Still concerned that there is something physically wrong. No delusions of reference here. Tolerating his medication well.   Principal Problem: Schizophrenia, paranoid (HCC) Diagnosis:   Patient Active Problem List   Diagnosis Date Noted  . Schizophrenia, paranoid (HCC) [F20.0] 09/06/2016  .  Substance induced mood disorder (HCC) [F19.94] 09/05/2016   Total Time spent with patient: 20 minutes  Past Psychiatric History: As in H&P  Past Medical History:  Past Medical History:  Diagnosis Date  . Asthma    History reviewed. No pertinent surgical history. Family History: History reviewed. No pertinent family history. Family Psychiatric  History: As in H&P Social History:  History  Alcohol Use  . Yes    Comment: occasionally     History  Drug Use No    Social History   Social History  . Marital status: Single    Spouse name: N/A  . Number of children: N/A  . Years of education: N/A   Social History Main Topics  . Smoking status: Current Some Day Smoker  . Smokeless tobacco: Former NeurosurgeonUser  . Alcohol use Yes     Comment: occasionally  . Drug use: No  . Sexual activity: Not Asked   Other Topics Concern  . None   Social History Narrative  . None   Additional Social History:      Sleep: Fair  Appetite:  Good  Current Medications: Current Facility-Administered Medications  Medication Dose Route Frequency Provider Last Rate Last Dose  . acetaminophen (TYLENOL) tablet 650 mg  650 mg Oral Q6H PRN Adonis BrookAgustin, Sheila, NP   650 mg at 09/06/16 1446  . albuterol (PROVENTIL HFA;VENTOLIN HFA) 108 (90 Base) MCG/ACT inhaler 2 puff  2 puff Inhalation Q6H PRN Kerry HoughSimon, Spencer E, PA-C   2 puff at 09/08/16 2116  . alum & mag hydroxide-simeth (MAALOX/MYLANTA) 200-200-20 MG/5ML suspension 30 mL  30 mL Oral Q4H  PRN Adonis Brook, NP      . ARIPiprazole (ABILIFY) tablet 20 mg  20 mg Oral Daily Izediuno, Delight Ovens, MD   20 mg at 09/09/16 0828  . ibuprofen (ADVIL,MOTRIN) tablet 600 mg  600 mg Oral Q8H PRN Adonis Brook, NP      . LORazepam (ATIVAN) tablet 1 mg  1 mg Oral Q8H PRN Adonis Brook, NP   1 mg at 09/05/16 2154  . magnesium hydroxide (MILK OF MAGNESIA) suspension 30 mL  30 mL Oral Daily PRN Adonis Brook, NP      . ondansetron [facility]) tablet 4 mg  4 mg Oral Q8H PRN  Adonis Brook, NP      . traZODone (DESYREL) tablet 50 mg  50 mg Oral QHS PRN Adonis Brook, NP   50 mg at 09/08/16 2116    Lab Results: No results found for this or any previous visit (from the past 48 hour(s)).  Blood Alcohol level:  Lab Results  Component Value Date   ETH <5 09/04/2016    Metabolic Disorder Labs: No results found for: HGBA1C, MPG No results found for: PROLACTIN No results found for: CHOL, TRIG, HDL, CHOLHDL, VLDL, LDLCALC  Physical Findings: AIMS: Facial and Oral Movements Muscles of Facial Expression: None, normal Lips and Perioral Area: None, normal Jaw: None, normal Tongue: None, normal,Extremity Movements Upper (arms, wrists, hands, fingers): None, normal Lower (legs, knees, ankles, toes): None, normal, Trunk Movements Neck, shoulders, hips: None, normal, Overall Severity Severity of abnormal movements (highest score from questions above): None, normal Incapacitation due to abnormal movements: None, normal Patient's awareness of abnormal movements (rate only patient's report): No Awareness, Dental Status Current problems with teeth and/or dentures?: No Does patient usually wear dentures?: No  CIWA:    COWS:     Musculoskeletal: Strength & Muscle Tone: within normal limits Gait & Station: normal Patient leans: N/A  Psychiatric Specialty Exam: Physical Exam  Constitutional: He is oriented to person, place, and time. He appears well-developed and well-nourished.  HENT:  Head: Normocephalic and atraumatic.  Eyes: Conjunctivae are normal. Pupils are equal, round, and reactive to light.  Neck: Normal range of motion. Neck supple.  Cardiovascular: Normal rate and regular rhythm.   Respiratory: Effort normal and breath sounds normal.  GI: Soft. Bowel sounds are normal.  Musculoskeletal: Normal range of motion.  Neurological: He is alert and oriented to person, place, and time.  Skin: Skin is warm and dry.  Psychiatric:  As above    ROS   Blood pressure 102/70, pulse 83, temperature 98.3 F (36.8 C), resp. rate 16, height 5\' 11"  (1.803 m), weight 65.3 kg (144 lb).Body mass index is 20.08 kg/m.  General Appearance: Neatly dressed, calm. Moderate engagement  Eye Contact:  Minimal  Speech:  soft spoken  Volume:  Decreased  Mood:  Relatively less overwhelmed.   Affect:  Blunted and has a psychotic quality.   Thought Process:  Linear  Orientation:  Full (Time, Place, and Person)  Thought Content:  Hypochondrial delusions, less paranoid delusion. Less auditory hallucinations. No violent thoughts.   Suicidal Thoughts:  No  Homicidal Thoughts:  No  Memory:  Immediate;   Fair Recent;   Fair Remote;   Fair  Judgement:  Fair  Insight:  Partial  Psychomotor Activity:  Decreased  Concentration:  Limited   Recall:  Good  Fund of Knowledge:  Fair  Language:  Good  Akathisia:  No  Handed:    AIMS (if indicated):     Assets:  Housing  ADL's:  Intact  Cognition:  WNL  Sleep:  Number of Hours: 4.75     Treatment Plan Summary: Psychosis is responding to current regimen. He is tolerating his medications well. Would titrate his dose further today.   Psychiatric: Schizophrenia paranoid type SUD [facility])  Medical:  Psychosocial:  Unemployed  PLAN: 1. Increase Abilify to 30 mg daily 2. Continue to monitor mood, behavior and interaction with peers   Georgiann Cocker, MD 09/09/2016, 11:52 AM

## 2016-09-09 NOTE — Plan of Care (Signed)
Problem: Education: Goal: Will be free of psychotic symptoms Outcome: Progressing Nurse discussed A/V hallucinations with patient and coping skills.

## 2016-09-09 NOTE — Progress Notes (Addendum)
D:  Patient's self inventory sheet, patient has poor sleep.  Sleep medication not helpful.  Poor appetite, low energy level, good concentration.  Rated depression and anxiety 8, hopeless 7.  Denied withdrawals.  Denied SI.  Physical problems, headaches, lightheaded, pain.  Worst pain in past 24 hours is #5, heart, chest, head.  No pain medication.  Denied discharge plans. A:  Medications administered per MD orders.  Emotional support and encouragement given patient. R:  Denied SI and Hi, contracts for safety.   Denied A/V hallucinations.  Safety maintained with 15 minute checks. Patient stated he feels very tired today, mumbles at times.  Mother visited this afternoon.  Stated her son uses hydrocortisone cream on dry patch at back of his neck.

## 2016-09-09 NOTE — [facility] Group Notes (Signed)
[facility] LCSW Group Therapy  09/09/2016  11 AM  Type of Therapy:  Group Therapy  Participation Level:  Minimal  Participation Quality:  Attentive  Affect:  Appropriate  Cognitive:  Alert and Oriented  Insight:  None shared Engagement in Therapy:  None noted  Modes of Intervention:  Discussion, Exploration, Rapport Building, Socialization and Support  Summary of Progress/Problems: Topic for today was thoughts and feelings regarding discharge. We discussed fears of upcoming changes including judgements, expectations and stigma of mental health issues. We then discussed supports: what constitutes a supportive framework, identification of supports and what to do when others are not supportive. Patient was attentive while others processed their greatest challenges.   Carney Bernatherine C Harrill, LCSW

## 2016-09-09 NOTE — Progress Notes (Signed)
Pt attended the AA meeting but left and returned to his room within the first 15 minutes.

## 2016-09-09 NOTE — Progress Notes (Signed)
Nursing Progress Note: 7p-7a D: Pt currently presents with a flat/depressed affect and behavior. Pt states "I hear voices at times. It really makes me paranoid." Interacting minimally with milieu. Pt reports off and on sleep with current medication regimen.   A: Pt provided with medications per providers orders. Pt's labs and vitals were monitored throughout the night. Pt supported emotionally and encouraged to express concerns and questions. Pt educated on medications.  R: Pt's safety ensured with 15 minute and environmental checks. Pt currently denies SI/HI/Self Harm/VH and endorses AH. Pt verbally contracts to seek staff if SI/HI or A/VH occurs and to consult with staff before acting on any harmful thoughts. Will continue to monitor.

## 2016-09-10 ENCOUNTER — Encounter (HOSPITAL_COMMUNITY): Payer: Self-pay | Admitting: Psychiatry

## 2016-09-10 DIAGNOSIS — F1220 Cannabis dependence, uncomplicated: Secondary | ICD-10-CM | POA: Clinically undetermined

## 2016-09-10 MED ORDER — OLANZAPINE 5 MG PO TABS: 5.0000 mg | ORAL_TABLET | Freq: Three times a day (TID) | ORAL | Status: DC | PRN

## 2016-09-10 MED ORDER — ARIPIPRAZOLE 15 MG PO TABS
30.0000 mg | ORAL_TABLET | Freq: Every day | ORAL | Status: DC
  Administered 2016-09-11 – 2016-09-13 (×3): 30 mg via ORAL
  Filled 2016-09-10 (×3): qty 2
  Filled 2016-09-10: qty 14
  Filled 2016-09-10: qty 2

## 2016-09-10 MED ORDER — OLANZAPINE 10 MG IM SOLR: 5.0000 mg | Freq: Three times a day (TID) | INTRAMUSCULAR | Status: DC | PRN

## 2016-09-10 NOTE — Progress Notes (Signed)
Adult Psychoeducational Group Note  Date:  09/10/2016 Time:  8:57 PM  Group Topic/Focus:  Wrap-Up Group:   The focus of this group is to help patients review their daily goal of treatment and discuss progress on daily workbooks.  Participation Level:  Active  Participation Quality:  Appropriate and Attentive  Affect:  Appropriate  Cognitive:  Appropriate  Insight: Appropriate  Engagement in Group:  Engaged  Modes of Intervention:  Discussion  Additional Comments:  Pts goal for today was to not get bored. Pt identified 2 coping skills: 1) to not smoke, 2) keeping his mind busy--reading books/working on games/puzzles.  Caswell CorwinOwen, Amra Shukla C 09/10/2016, 8:57 PM

## 2016-09-10 NOTE — [facility] Group Notes (Signed)
[facility] LCSW Group Therapy  09/10/2016 1:15 pm  Type of Therapy: Process Group Therapy  Participation Level:  Active  Participation Quality:  Appropriate  Affect:  Flat  Cognitive:  Oriented  Insight:  Improving  Engagement in Group:  Limited  Engagement in Therapy:  Limited  Modes of Intervention:  Activity, Clarification, Education, Problem-solving and Support  Summary of Progress/Problems: Today's group addressed the issue of overcoming obstacles.  Patients were asked to identify their biggest obstacle post d/c that stands in the way of their on-going success, and then problem solve as to how to manage this. Stayed for first half of group.  Attentive, but no input.  Left and did not return  Daryel Geraldorth, Ihor Meinzer B 09/10/2016   4:05 PM

## 2016-09-10 NOTE — Plan of Care (Signed)
Problem: Safety: Goal: Ability to remain free from injury will improve Outcome: Progressing Pt kept safe on the unit by Q 15 observations.

## 2016-09-10 NOTE — Progress Notes (Signed)
Recreation Therapy Notes  Date: 09/10/16 Time: 1000 Location: [address] Dayroom  Group Topic: Coping Skills  Goal Area(s) Addresses:  Patient will be able to identify positive coping skills. Patient will be able to identify benefits of coping skills. Patient will be able to identify benefits of using coping skills post d/c.  Behavioral Response: Engaged  Intervention: Engineer, siteCoping skills worksheet, magazines, scissors, glue sticks, construction paper  Activity: Coping skills collage.  Patients were to identify coping skills that can be used for diversions, cognitive, social, tension releasers and for physical.   Education: Coping Skills, Discharge Planning.   Education Outcome: Acknowledges understanding/In group clarification offered/Needs additional education.   Clinical Observations/Feedback: Pt stated going out for coffee with friends was a diversion; having dessert with friends for social; reading magazines as cognitive; chocolate as a tension releaser and walking with friends as physical.  Pt expressed his coping skills would help him to make better decisions.    Caroll RancherMarjette Josey Dettmann, LRT/CTRS         Caroll RancherLindsay, Talina Pleitez A 09/10/2016 11:53 AM

## 2016-09-10 NOTE — Consult Note (Signed)
09/10/2016 [facility].  EKG was compared to previous EKG. - determined that this is patient's baseline. patient is currently asymptomatic and vitals are stable.

## 2016-09-10 NOTE — Progress Notes (Signed)
[facility] MD Progress Note  09/10/2016 3:05 PM SIRAJ DERMODY  MRN:  [id number] Subjective: Patient states " I am still paranoid on and off, but I think abilify is working."  Objective:Patient seen and chart reviewed.Discussed patient with treatment team.  Pt today seen as paranoid , avoids eye contact , reports he feels better. Pt continues to appear depressed and delusional - appears to be a limited historian. Answers to questions are mostly in short phrases. Pt is observed as attending groups , but minimal participation noted. Denies ADRs to medications . Reports he wants the abilify moved to bedtime , but otherwise declines further medication changes. Continue to treat.   Principal Problem: Schizophrenia, paranoid (HCC) Diagnosis:   Patient Active Problem List   Diagnosis Date Noted  . Cannabis use disorder, severe, dependence (HCC) [F12.20] 09/10/2016  . Schizophrenia, paranoid (HCC) [F20.0] 09/06/2016   Total Time spent with patient: 25 minutes  Past Psychiatric History: As in H&P  Past Medical History:  Past Medical History:  Diagnosis Date  . Asthma    History reviewed. No pertinent surgical history. Family History: Please see H&P.  Family Psychiatric  History: Please see H&P.  Social History:  History  Alcohol Use  . Yes    Comment: occasionally     History  Drug Use No    Social History   Social History  . Marital status: Single    Spouse name: N/A  . Number of children: N/A  . Years of education: N/A   Social History Main Topics  . Smoking status: Current Some Day Smoker  . Smokeless tobacco: Former Neurosurgeon  . Alcohol use Yes     Comment: occasionally  . Drug use: No  . Sexual activity: Not Asked   Other Topics Concern  . None   Social History Narrative  . None   Additional Social History:      Sleep: fair  Appetite:  Fair  Current Medications: Current Facility-Administered Medications  Medication Dose Route Frequency Provider Last Rate  Last Dose  . acetaminophen (TYLENOL) tablet 650 mg  650 mg Oral Q6H PRN Adonis Brook, NP   650 mg at 09/09/16 2134  . albuterol (PROVENTIL HFA;VENTOLIN HFA) 108 (90 Base) MCG/ACT inhaler 2 puff  2 puff Inhalation Q6H PRN Kerry Hough, PA-C   2 puff at 09/09/16 1908  . alum & mag hydroxide-simeth (MAALOX/MYLANTA) 200-200-20 MG/5ML suspension 30 mL  30 mL Oral Q4H PRN Adonis Brook, NP      . Melene Muller ON 09/11/2016] ARIPiprazole (ABILIFY) tablet 30 mg  30 mg Oral QHS Itzelle Gains, MD      . ibuprofen (ADVIL,MOTRIN) tablet 600 mg  600 mg Oral Q8H PRN Adonis Brook, NP   600 mg at 09/09/16 2134  . LORazepam (ATIVAN) tablet 1 mg  1 mg Oral Q8H PRN Adonis Brook, NP   1 mg at 09/05/16 2154  . magnesium hydroxide (MILK OF MAGNESIA) suspension 30 mL  30 mL Oral Daily PRN Adonis Brook, NP      . OLANZapine (ZYPREXA) tablet 5 mg  5 mg Oral TID PRN Jomarie Longs, MD       Or  . OLANZapine (ZYPREXA) injection 5 mg  5 mg Intramuscular TID PRN Jomarie Longs, MD      . ondansetron (ZOFRAN) tablet 4 mg  4 mg Oral Q8H PRN Adonis Brook, NP      . traZODone (DESYREL) tablet 50 mg  50 mg Oral QHS PRN Adonis Brook, NP   50  mg at 09/08/16 2116    Lab Results: No results found for this or any previous visit (from the past 48 hour(s)).  Blood Alcohol level:  Lab Results  Component Value Date   ETH <5 09/04/2016    Metabolic Disorder Labs: No results found for: HGBA1C, MPG No results found for: PROLACTIN No results found for: CHOL, TRIG, HDL, CHOLHDL, VLDL, LDLCALC  Physical Findings: AIMS: Facial and Oral Movements Muscles of Facial Expression: None, normal Lips and Perioral Area: None, normal Jaw: None, normal Tongue: None, normal,Extremity Movements Upper (arms, wrists, hands, fingers): None, normal Lower (legs, knees, ankles, toes): None, normal, Trunk Movements Neck, shoulders, hips: None, normal, Overall Severity Severity of abnormal movements (highest score from questions  above): None, normal Incapacitation due to abnormal movements: None, normal Patient's awareness of abnormal movements (rate only patient's report): No Awareness, Dental Status Current problems with teeth and/or dentures?: No Does patient usually wear dentures?: No  CIWA:  CIWA-Ar Total: 1 COWS:  COWS Total Score: 2  Musculoskeletal: Strength & Muscle Tone: within normal limits Gait & Station: normal Patient leans: N/A  Psychiatric Specialty Exam: Physical Exam  Nursing note and vitals reviewed.   Review of Systems  Psychiatric/Behavioral: Positive for depression. The patient is nervous/anxious.   All other systems reviewed and are negative.   Blood pressure 122/79, pulse 98, temperature 98.4 F (36.9 C), temperature source Oral, resp. rate 20, height 5\' 11"  (1.803 m), weight 65.3 kg (144 lb).Body mass index is 20.08 kg/m.  General Appearance: calm, casual  Eye Contact:  Minimal  Speech:  Normal Rate  Volume:  Decreased  Mood:  Anxious   Affect:  Constricted  Thought Process:  Linear and Descriptions of Associations: Intact  Orientation:  Other:  person, situation  Thought Content: paranoid and delusional, improving per report  Suicidal Thoughts:  No  Homicidal Thoughts:  No  Memory:  Immediate;   Fair Recent;   Fair Remote;   Fair  Judgement:  Impaired  Insight:  Shallow  Psychomotor Activity:  Decreased  Concentration: fair  Recall:  [employer]  Fund of Knowledge:  Fair  Language:  Fair  Akathisia:  No  Handed:    AIMS (if indicated):     Assets:  Housing  ADL's:  Intact  Cognition:  WNL  Sleep:  Number of Hours: 5.25     Treatment Plan Summary:Patient is a 89 y old AAM who presented with paranoid delusions as well as ideas of reference, cannabis /cocaine abuse ( per report), presents today as paranoid , limited participation , however reports partial improvement , wants to stay on his medications . Declines further medication changes.  Plan:  Will continue  Abilify 30 mg po at bedtime for psychosis. Will continue Trazodone 50 mg po qhs prn for sleep issues. Will order EKG for qtc monitoring. Will get tsh, lipid panel, hba1c, pl. CSW will continue to work on disposition.   Austyn Seier, MD 09/10/2016, 3:05 PM

## 2016-09-10 NOTE — Progress Notes (Signed)
DAR NOTE: Pt present with flat affect and depressed mood in the unit. Pt has been isolating himself and has been bed most of the time. Pt denies physical pain, took all his meds as scheduled. As per self inventory, pt had a poor night sleep, fair appetite, normal energy, and good concentration. Pt rate depression at 6, hopeless ness at 6, and anxiety at 6. Pt's safety ensured with 15 minute and environmental checks. Pt currently denies SI/HI and A/V hallucinations. Pt verbally agrees to seek staff if SI/HI or A/VH occurs and to consult with staff before acting on these thoughts. Will continue POC.

## 2016-09-10 NOTE — Plan of Care (Signed)
Problem: Activity: Goal: Sleeping patterns will improve Outcome: Progressing Pt. asked for a PRN trazodone at bedtime. Will monitor effectiveness.

## 2016-09-11 LAB — LIPID PANEL
CHOLESTEROL: 211 mg/dL — AB (ref 0–200)
HDL: 87 mg/dL (ref >40)
LDL Cholesterol: 104 mg/dL — ABNORMAL HIGH (ref 0–99)
TRIGLYCERIDES: 102 mg/dL (ref <150)
Total CHOL/HDL Ratio: 2.4 RATIO
VLDL: 20 mg/dL (ref 0–40)

## 2016-09-11 LAB — TSH: TSH: 1.079 u[IU]/mL (ref 0.350–4.500)

## 2016-09-11 NOTE — Progress Notes (Signed)
  DATA ACTION RESPONSE  Objective- Pt. is visible in the dayroom, seen watching TV. No interaction. Presents with a flat affect and mood. Pt. appears guarded/minimal with rapport. No other c/o and abnormal s/s.  Subjective- Denies having any SI/HI/AVH/Pain at this time. Is cooperative and remain safe on the unit.  1:1 interaction in private to establish rapport. Encouragement, education, & support given from staff. . PRN Albuterol and Trazodone requested and will re-eval accordingly.   Safety maintained with Q 15 checks. Continues to follow treatment plan and will monitor closely. No additonal questions/concerns noted.

## 2016-09-11 NOTE — Progress Notes (Signed)
DAR NOTE: Patient remained paranoid and guarded.  Presents with flat affect and depressed mood.  Denies auditory and visual hallucinations.  Described energy level as low and concentration as good.  Rates depression at 5, hopelessness at 6, and anxiety at 7.  Maintained on routine safety checks.  Medications given as prescribed.  Support and encouragement offered as needed.  Attended group and participated.  States goal for today is "discharge."  Patient remained withdrawn and isolative.  Minimal interaction with staff and peers.  Offered no complaint.

## 2016-09-11 NOTE — Progress Notes (Signed)
Recreation Therapy Notes  Animal-Assisted Activity (AAA) Program Checklist/Progress Notes Patient Eligibility Criteria Checklist & Daily Group note for Rec TxIntervention  Date: 05.15.2018 Time: 2:45pm Location: [address] Dayroom    AAA/T Program Assumption of Risk Form signed by Patient/ or Parent Legal Guardian Yes  Patient is free of allergies or sever asthma Yes  Patient reports no fear of animals Yes  Patient reports no history of cruelty to animals Yes  Patient understands his/her participation is voluntary Yes  Behavioral Response: Did not attend.   Cuthbert Turton L Lolitha Tortora, LRT/CTRS       My Madariaga L 09/11/2016 2:54 PM 

## 2016-09-11 NOTE — [facility] Group Notes (Signed)
[facility] Group Therapy 09/11/2016 1:15pm  Type of Therapy: Mental Health Association Presentation  Participation Level: Active  Participation Quality: Attentive  Affect: Appropriate  Cognitive: Oriented  Insight: Developing/Improving  Engagement in Therapy: Engaged  Modes of Intervention: Discussion, Education and Socialization  Summary of Progress/Problems: Mental Health Association (MHA) Speaker came to talk about his personal journey with substance abuse and addiction. The pt processed ways by which to relate to the speaker. MHA speaker provided handouts and educational information pertaining to groups and services offered by the [facility]. Pt was engaged in speaker's presentation and was receptive to resources provided.    Vernie ShanksLauren Rushie Brazel, LCSW 09/11/2016 2:44 PM

## 2016-09-11 NOTE — Tx Team (Signed)
Interdisciplinary Treatment and Diagnostic Plan Update  09/11/2016 Time of Session: 1:44 PM  Shane Small MRN: [id number]  Principal Diagnosis: Schizophrenia, paranoid ([place])  Secondary Diagnoses: Principal Problem:   Schizophrenia, paranoid ([place]) Active Problems:   Cannabis use disorder, severe, dependence ([place])   Current Medications:  Current Facility-Administered Medications  Medication Dose Route Frequency Provider Last Rate Last Dose  . acetaminophen (TYLENOL) tablet 650 mg  650 mg Oral Q6H PRN Kerrie [place], NP   650 mg at 09/09/16 2134  . albuterol (PROVENTIL HFA;VENTOLIN HFA) 108 (90 Base) MCG/ACT inhaler 2 puff  2 puff Inhalation Q6H PRN Laverle Hobby, PA-C   2 puff at 09/10/16 2003  . alum & mag hydroxide-simeth (MAALOX/MYLANTA) 200-200-20 MG/5ML suspension 30 mL  30 mL Oral Q4H PRN Kerrie [place], NP      . ARIPiprazole (ABILIFY) tablet 30 mg  30 mg Oral QHS Eappen, Saramma, MD      . ibuprofen (ADVIL,MOTRIN) tablet 600 mg  600 mg Oral Q8H PRN Kerrie [place], NP   600 mg at 09/09/16 2134  . LORazepam (ATIVAN) tablet 1 mg  1 mg Oral Q8H PRN Kerrie [place], NP   1 mg at 09/05/16 2154  . magnesium hydroxide (MILK OF MAGNESIA) suspension 30 mL  30 mL Oral Daily PRN Kerrie [place], NP      . OLANZapine (ZYPREXA) tablet 5 mg  5 mg Oral TID PRN Ursula Alert, MD       Or  . OLANZapine (ZYPREXA) injection 5 mg  5 mg Intramuscular TID PRN Ursula Alert, MD      . ondansetron (ZOFRAN) tablet 4 mg  4 mg Oral Q8H PRN Kerrie [place], NP      . traZODone (DESYREL) tablet 50 mg  50 mg Oral QHS PRN Kerrie [place], NP   50 mg at 09/10/16 2126    PTA Medications: Prescriptions Prior to Admission  Medication Sig Dispense Refill Last Dose  . albuterol (PROVENTIL HFA;VENTOLIN HFA) 108 (90 Base) MCG/ACT inhaler Inhale 2 puffs into the lungs every 4 (four) hours as needed for wheezing or shortness of breath. 1 Inhaler 0 09/03/2016 at Unknown time  . OVER THE COUNTER MEDICATION  Take 1-2 tablets by mouth daily as needed (allergies, runny nose). OTC allergy medication.   09/03/2016 at Unknown time  . OVER THE COUNTER MEDICATION Place 1 drop into both eyes 4 (four) times daily as needed (dry eyes). OTC eye drops.   Unknown at Unknown time    Treatment Modalities: Medication Management, Group therapy, Case management,  1 to 1 session with clinician, Psychoeducation, Recreational therapy.   Physician Treatment Plan for Primary Diagnosis: Schizophrenia, paranoid ([place]) Long Term Goal(s): Improvement in symptoms so as ready for discharge  Short Term Goals: Ability to identify changes in lifestyle to reduce recurrence of condition will improve Ability to verbalize feelings will improve Ability to disclose and discuss suicidal ideas Ability to demonstrate self-control will improve Ability to identify and develop effective coping behaviors will improve Ability to maintain clinical measurements within normal limits will improve Compliance with prescribed medications will improve Ability to identify triggers associated with substance abuse/mental health issues will improve Ability to identify changes in lifestyle to reduce recurrence of condition will improve Ability to verbalize feelings will improve Ability to disclose and discuss suicidal ideas Ability to demonstrate self-control will improve Ability to identify and develop effective coping behaviors will improve Ability to maintain clinical measurements within normal limits will improve Compliance with prescribed medications will improve Ability to  identify triggers associated with substance abuse/mental health issues will improve  Medication Management: Evaluate patient's response, side effects, and tolerance of medication regimen.  Therapeutic Interventions: 1 to 1 sessions, Unit Group sessions and Medication administration.  Evaluation of Outcomes: Progressing  Physician Treatment Plan for Secondary Diagnosis:  Principal Problem:   Schizophrenia, paranoid ([place]) Active Problems:   Cannabis use disorder, severe, dependence ([place])   Long Term Goal(s): Improvement in symptoms so as ready for discharge  Short Term Goals: Ability to identify changes in lifestyle to reduce recurrence of condition will improve Ability to verbalize feelings will improve Ability to disclose and discuss suicidal ideas Ability to demonstrate self-control will improve Ability to identify and develop effective coping behaviors will improve Ability to maintain clinical measurements within normal limits will improve Compliance with prescribed medications will improve Ability to identify triggers associated with substance abuse/mental health issues will improve Ability to identify changes in lifestyle to reduce recurrence of condition will improve Ability to verbalize feelings will improve Ability to disclose and discuss suicidal ideas Ability to demonstrate self-control will improve Ability to identify and develop effective coping behaviors will improve Ability to maintain clinical measurements within normal limits will improve Compliance with prescribed medications will improve Ability to identify triggers associated with substance abuse/mental health issues will improve  Medication Management: Evaluate patient's response, side effects, and tolerance of medication regimen.  Therapeutic Interventions: 1 to 1 sessions, Unit Group sessions and Medication administration.  Evaluation of Outcomes: Progressing   RN Treatment Plan for Primary Diagnosis: Schizophrenia, paranoid ([place]) Long Term Goal(s): Knowledge of disease and therapeutic regimen to maintain health will improve  Short Term Goals: Ability to identify and develop effective coping behaviors will improve and Compliance with prescribed medications will improve  Medication Management: RN will administer medications as ordered by provider, will assess and evaluate  patient's response and provide education to patient for prescribed medication. RN will report any adverse and/or side effects to prescribing provider.  Therapeutic Interventions: 1 on 1 counseling sessions, Psychoeducation, Medication administration, Evaluate responses to treatment, Monitor vital signs and CBGs as ordered, Perform/monitor CIWA, COWS, AIMS and Fall Risk screenings as ordered, Perform wound care treatments as ordered.  Evaluation of Outcomes: Progressing   LCSW Treatment Plan for Primary Diagnosis: Schizophrenia, paranoid ([place]) Long Term Goal(s): Safe transition to appropriate next level of care at discharge, Engage patient in therapeutic group addressing interpersonal concerns.  Short Term Goals: Engage patient in aftercare planning with referrals and resources  Therapeutic Interventions: Assess for all discharge needs, 1 to 1 time with Social worker, Explore available resources and support systems, Assess for adequacy in community support network, Educate family and significant other(s) on suicide prevention, Complete Psychosocial Assessment, Interpersonal group therapy.  Evaluation of Outcomes: Met  Return home, follow up [facility]   Progress in Treatment: Attending groups: Yes Participating in groups: Yes Taking medication as prescribed: Yes Toleration medication: Yes, no side effects reported at this time Family/Significant other contact made: Yes with father Patient understands diagnosis: No  Limited insight Discussing patient identified problems/goals with staff: Yes Medical problems stabilized or resolved: Yes Denies suicidal/homicidal ideation: Yes Issues/concerns per patient self-inventory: None Other: N/A  New problem(s) identified: None identified at this time.   New Short Term/Long Term Goal(s): None identified at this time.   Discharge Plan or Barriers: Pt will return home and follow-[facility]  Reason for Continuation of Hospitalization: Delusions   Paranoia Hallucinations Medication stabilization   Estimated Length of Stay: 2-4 days  Attendees: Patient: 09/11/2016  1:44 PM  Physician: Ursula Alert, MD 09/11/2016  1:44 PM  Nursing: Minta Balsam, RN 09/11/2016  1:44 PM  RN Care Manager: Lars Pinks, RN 09/11/2016  1:44 PM  Social Worker: Adriana Reams, LCSW 09/11/2016  1:44 PM  Recreational Therapist:  09/11/2016  1:44 PM  Other: Lindell Spar, NP; Larose Kells, NP 09/11/2016  1:44 PM  Other:  09/11/2016  1:44 PM    Scribe for Treatment Team:  Roque Lias LCSW 09/11/2016 1:44 PM

## 2016-09-11 NOTE — Progress Notes (Signed)
[facility] MD Progress Note  09/11/2016 3:54 PM Shane Small  MRN:  [id number]  Subjective: Shane Small states " I feel paranoid. I don't know why everyone keep asking me to go to group. I don't want to be in that room with everybody watching & talking about me. I rather stay in my room".  Objective: Patient seen and chart reviewed .Discussed patient with treatment team. Pt remains paranoid, is not making any eye contact. He keeps to himself. Comes out of his room, seen on the hall ways hanging around, not communicating with anyone. Pt presents with flat affect,  continues to appear depressed and delusional - appears to be a limited historian. Answers to questions are mostly in short phrases & monotonic. Pt is observed as attending groups, but minimal participation noted. Denies ADRs to medications . Reports he wants the abilify moved to bedtime, but otherwise declines further medication changes. Continue to treat.  Principal Problem: Schizophrenia, paranoid (HCC) Diagnosis:   Patient Active Problem List   Diagnosis Date Noted  . Cannabis use disorder, severe, dependence (HCC) [F12.20] 09/10/2016  . Schizophrenia, paranoid (H[facility]) [F20.0] 09/06/2016   Total Time spent with patient: 15 minutes  Past Psychiatric History: As in H&P  Past Medical History:  Past Medical History:  Diagnosis Date  . Asthma    History reviewed. No pertinent surgical history. Family History: Please see H&P.  Family Psychiatric  History: Please see H&P.  Social History:  History  Alcohol Use  . Yes    Comment: occasionally     History  Drug Use No    Social History   Social History  . Marital status: Single    Spouse name: N/A  . Number of children: N/A  . Years of education: N/A   Social History Main Topics  . Smoking status: Current Some Day Smoker  . Smokeless tobacco: Former Neurosurgeon  . Alcohol use Yes     Comment: occasionally  . Drug use: No  . Sexual activity: Not Asked   Other Topics Concern   . None   Social History Narrative  . None   Additional Social History:   Sleep: Fair  Appetite:  Fair  Current Medications: Current Facility-Administered Medications  Medication Dose Route Frequency Provider Last Rate Last Dose  . acetaminophen (TYLENOL) tablet 650 mg  650 mg Oral Q6H PRN Adonis [place], NP   650 mg at 09/09/16 2134  . albuterol (PROVENTIL HFA;VENTOLIN HFA) 108 (90 Base) MCG/ACT inhaler 2 puff  2 puff Inhalation Q6H PRN Kerry Hough, PA-C   2 puff at 09/10/16 2003  . alum & mag hydroxide-simeth (MAALOX/MYLANTA) 200-200-20 MG/5ML suspension 30 mL  30 mL Oral Q4H PRN Adonis Brook, NP      . ARIPiprazole (ABILIFY) tablet 30 mg  30 mg Oral QHS Eappen, Saramma, MD      . ibuprofen (ADVIL,MOTRIN) tablet 600 mg  600 mg Oral Q8H PRN Adonis Brook, NP   600 mg at 09/09/16 2134  . LORazepam (ATIVAN) tablet 1 mg  1 mg Oral Q8H PRN Adonis Brook, NP   1 mg at 09/05/16 2154  . magnesium hydroxide (MILK OF MAGNESIA) suspension 30 mL  30 mL Oral Daily PRN Adonis Brook, NP      . OLANZapine (ZYPREXA) tablet 5 mg  5 mg Oral TID PRN Jomarie Longs, MD       Or  . OLANZapine (ZYPREXA) injection 5 mg  5 mg Intramuscular TID PRN Jomarie Longs, MD      .  ondansetron (ZOFRAN) tablet 4 mg  4 mg Oral Q8H PRN Adonis Brook, NP      . traZODone (DESYREL) tablet 50 mg  50 mg Oral QHS PRN Adonis [place], NP   50 mg at 09/10/16 2126    Lab Results:  Results for orders placed or performed during the hospital encounter of 09/05/16 (from the past 48 hour(s))  TSH     Status: None   Collection Time: 09/11/16  6:13 AM  Result Value Ref Range   TSH 1.079 0.350 - 4.500 uIU/mL    Comment: Performed by a 3rd Generation assay with a functional sensitivity of <=0.01 uIU/mL. Performed at [facility], [address] [place], [place] [address]   Lipid panel     Status: Abnormal   Collection Time: 09/11/16  6:13 AM  Result Value Ref Range   Cholesterol 211 (H)  0 - 200 mg/dL   Triglycerides 604 <540 mg/dL   HDL 87 >98 mg/dL   Total CHOL/HDL Ratio 2.4 RATIO   VLDL 20 0 - 40 mg/dL   LDL Cholesterol 119 (H) 0 - 99 mg/dL    Comment:        Total Cholesterol/HDL:CHD Risk Coronary Heart Disease Risk Table                     Men   Women  1/2 Average Risk   3.4   3.3  Average Risk       5.0   4.4  2 X Average Risk   9.6   7.1  3 X Average Risk  23.4   11.0        Use the calculated Patient Ratio above and the CHD Risk Table to determine the patient's CHD Risk.        ATP III CLASSIFICATION (LDL):  <100     mg/dL   Optimal  147-829  mg/dL   Near or Above                    Optimal  130-159  mg/dL   Borderline  562-130  mg/dL   High  >865     mg/dL   Very High Performed at [facility], [address], [place], [place] [address]    Blood Alcohol level:  Lab Results  Component Value Date   ETH <5 09/04/2016   Metabolic Disorder Labs: No results found for: HGBA1C, MPG No results found for: PROLACTIN Lab Results  Component Value Date   CHOL 211 (H) 09/11/2016   TRIG 102 09/11/2016   HDL 87 09/11/2016   CHOLHDL 2.4 09/11/2016   VLDL 20 09/11/2016   LDLCALC 104 (H) 09/11/2016   Physical Findings: AIMS: Facial and Oral Movements Muscles of Facial Expression: None, normal Lips and Perioral Area: None, normal Jaw: None, normal Tongue: None, normal,Extremity Movements Upper (arms, wrists, hands, fingers): None, normal Lower (legs, knees, ankles, toes): None, normal, Trunk Movements Neck, shoulders, hips: None, normal, Overall Severity Severity of abnormal movements (highest score from questions above): None, normal Incapacitation due to abnormal movements: None, normal Patient's awareness of abnormal movements (rate only patient's report): No Awareness, Dental Status Current problems with teeth and/or dentures?: No Does patient usually wear dentures?: No  CIWA:  CIWA-Ar Total: 1 COWS:  COWS Total Score:  2  Musculoskeletal: Strength & Muscle Tone: within normal limits Gait & Station: normal Patient leans: N/A  Psychiatric Specialty Exam: Physical Exam  Nursing note and vitals reviewed.  Review of Systems  Psychiatric/Behavioral: Positive for depression. The patient is nervous/anxious.   All other systems reviewed and are negative.   Blood pressure 127/71, pulse 71, temperature 98.1 F (36.7 C), temperature source Oral, resp. rate 16, height 5\' 11"  (1.803 m), weight 65.3 kg (144 lb).Body mass index is 20.08 kg/m.  General Appearance: calm, casual  Eye Contact:  Minimal, no changes.  Speech:  Normal Rate  Volume:  Decreased  Mood: Anxious, presents depressed.   Affect:  Constricted  Thought Process:  Linear and Descriptions of Associations: Intact  Orientation:  Other:  person, situation  Thought Content: Paranoid and delusional, improving per report  Suicidal Thoughts:  No  Homicidal Thoughts:  No  Memory:  Immediate;   Fair Recent;   Fair Remote;   Fair  Judgement:  Impaired  Insight:  Shallow  Psychomotor Activity:  Decreased  Concentration: fair  Recall:  [employer]is[employer]Fair  Fund of Knowledge:  Fair  Language:  Fair  Akathisia:  No  Handed:    AIMS (if indicated):     Assets:  Housing  ADL's:  Intact  Cognition:  WNL  Sleep:  Number of Hours: 6.25   Treatment Plan Summary: Patient is a [age] y old AAM who presented with paranoid delusions as well as ideas of reference, cannabis /cocaine abuse ( per report), presents today as paranoid , limited participation , however reports partial improvement , wants to stay on his medications . Declines further medication changes.  Plan:  Will continue Abilify 30 mg po at bedtime for psychosis. Will continue Trazodone 50 mg po qhs prn for sleep issues. Reviewed tsh result - WNL, Reviewed lipid panel : Chol - 211, slightly, HDL - 87, LDL - 104, slightly elevated. HGba1c- result pending,  Prolactin - pending. CSW will continue to work on  disposition.  Sanjuana KavaNwoko, Jasmina Gendron I, NP, PMHNP, FNP-BC. 09/11/2016, 3:54 PMPatient ID: Arnoldo Moraleerrick L Ozbun, male   DOB: [birth year], 31 y.o.   MRN: [id number]

## 2016-09-12 LAB — HEMOGLOBIN A1C
Hgb A1c MFr Bld: 4.7 % — ABNORMAL LOW (ref 4.8–5.6)
Mean Plasma Glucose: 88 mg/dL

## 2016-09-12 LAB — PROLACTIN: Prolactin: 8.1 ng/mL (ref 4.0–15.2)

## 2016-09-12 MED ORDER — BENZTROPINE MESYLATE 0.5 MG PO TABS
0.5000 mg | ORAL_TABLET | Freq: Two times a day (BID) | ORAL | Status: DC
  Administered 2016-09-12 – 2016-09-14 (×4): 0.5 mg via ORAL
  Filled 2016-09-12: qty 1
  Filled 2016-09-12: qty 14
  Filled 2016-09-12 (×6): qty 1
  Filled 2016-09-12: qty 14

## 2016-09-12 MED ORDER — ARIPIPRAZOLE ER 400 MG IM SRER
400.0000 mg | INTRAMUSCULAR | Status: DC
  Filled 2016-09-12: qty 400

## 2016-09-12 NOTE — Progress Notes (Signed)
D: Spoke 1:1 with patient this morning who was lying in bed. Guarded with minimal information provided. Rating depression at a 5/10, hopelessness at a 4/10 and anxiety at a 7/10. Rates sleep as fair, appetite as good, energy as low and concentration as good. Patient's affect flat, mood depressed. Patient exhibiting some paranoia as he states peer is staring at him.  (Peer observed to be looking out onto unit however not focused on patient.) States goal for today is to "keep my body and mind busy."  Denies pain however reports having difficulty sitting still and reading book. Suspect akasthesia from abilify 30mg .   A: Medicated per orders, NP notified of patient's complaints. Order received for cogentin BID. Emotional support offered and self inventory reviewed. Encouraged completion of Suicide Safety Plan. Discussed POC with MD, SW.   R: Patient verbalizes understanding of POC though remains guarded. Patient denies SI/HI and remains safe on level III obs.

## 2016-09-12 NOTE — Plan of Care (Signed)
Problem: Safety: Goal: Ability to remain free from injury will improve Outcome: Progressing Patient has not engaged in self harm. Denies SI.   

## 2016-09-12 NOTE — Progress Notes (Signed)
Adult Psychoeducational Group Note  Date:  09/12/2016 Time:  2:08 PM  Group Topic/Focus:  Goals Group:   The focus of this group is to help patients establish daily goals to achieve during treatment and discuss how the patient can incorporate goal setting into their daily lives to aide in recovery.  Participation Level:  Active  Participation Quality:  Appropriate  Affect:  Appropriate  Cognitive:  Alert  Insight: Good  Engagement in Group:  Engaged  Modes of Intervention:  Discussion  Additional Comments:  Pt did participate in group this morning and states that he suffers with depression and paranoia.  Pt stated that he has not been on medication [facility]  Pt states that he does have a good support system at home.  Nghia Mcentee R Bryceson Grape 09/12/2016, 2:08 PM

## 2016-09-12 NOTE — Progress Notes (Signed)
[place] MD Progress Note  09/12/2016 4:01 PM Shane Small  MRN:  [id number] Subjective: Patient states " I still have thoughts about things that can happen to me outside and all, but I want to give abilify more time."   Objective:Patient seen and chart reviewed.Discussed patient with treatment team.  Pt seen as guarded, but calm and cooperative. He is communicating more which is an improvement. He continues to have some delusions , but does not seem to be preoccupied with it. Pt per staff , is seen as participating in groups more , continues to need support.     Principal Problem: Schizophrenia, paranoid (HCC) Diagnosis:   Patient Active Problem List   Diagnosis Date Noted  . Cannabis use disorder, severe, dependence (HCC) [F12.20] 09/10/2016  . Schizophrenia, paranoid (HCC) [F20.0] 09/06/2016   Total Time spent with patient: 25 minutes  Past Psychiatric History: As in H&P  Past Medical History:  Past Medical History:  Diagnosis Date  . Asthma    History reviewed. No pertinent surgical history. Family History: Please see H&P.  Family Psychiatric  History: Please see H&P.  Social History:  History  Alcohol Use  . Yes    Comment: occasionally     History  Drug Use No    Social History   Social History  . Marital status: Single    Spouse name: N/A  . Number of children: N/A  . Years of education: N/A   Social History Main Topics  . Smoking status: Current Some Day Smoker  . Smokeless tobacco: Former Neurosurgeon  . Alcohol use Yes     Comment: occasionally  . Drug use: No  . Sexual activity: Not Asked   Other Topics Concern  . None   Social History Narrative  . None   Additional Social History:      Sleep: fair  Appetite:  Fair  Current Medications: Current Facility-Administered Medications  Medication Dose Route Frequency Provider Last Rate Last Dose  . acetaminophen (TYLENOL) tablet 650 mg  650 mg Oral Q6H PRN Adonis Brook, NP   650 mg at  09/09/16 2134  . albuterol (PROVENTIL HFA;VENTOLIN HFA) 108 (90 Base) MCG/ACT inhaler 2 puff  2 puff Inhalation Q6H PRN Kerry Hough, PA-C   2 puff at 09/10/16 2003  . alum & mag hydroxide-simeth (MAALOX/MYLANTA) 200-200-20 MG/5ML suspension 30 mL  30 mL Oral Q4H PRN Adonis Brook, NP      . ARIPiprazole (ABILIFY) tablet 30 mg  30 mg Oral QHS Jomarie Longs, MD   30 mg at 09/11/16 2116  . benztropine (COGENTIN) tablet 0.5 mg  0.5 mg Oral BID Armandina Stammer I, NP   0.5 mg at 09/12/16 1201  . ibuprofen (ADVIL,MOTRIN) tablet 600 mg  600 mg Oral Q8H PRN Adonis Brook, NP   600 mg at 09/09/16 2134  . LORazepam (ATIVAN) tablet 1 mg  1 mg Oral Q8H PRN Adonis Brook, NP   1 mg at 09/05/16 2154  . magnesium hydroxide (MILK OF MAGNESIA) suspension 30 mL  30 mL Oral Daily PRN Adonis Brook, NP      . OLANZapine (ZYPREXA) tablet 5 mg  5 mg Oral TID PRN Jomarie Longs, MD       Or  . OLANZapine (ZYPREXA) injection 5 mg  5 mg Intramuscular TID PRN Daelyn Pettaway, Levin Bacon, MD      . ondansetron (ZOFRAN) tablet 4 mg  4 mg Oral Q8H PRN Adonis Brook, NP      . traZODone (DESYREL) tablet 50  mg  50 mg Oral QHS PRN Adonis Brook, NP   50 mg at 09/11/16 2251    Lab Results:  Results for orders placed or performed during the hospital encounter of 09/05/16 (from the past 48 hour(s))  TSH     Status: None   Collection Time: 09/11/16  6:13 AM  Result Value Ref Range   TSH 1.079 0.350 - 4.500 uIU/mL    Comment: Performed by a 3rd Generation assay with a functional sensitivity of <=0.01 uIU/mL. Performed at [facility], [address], [place], [place] [address]   Lipid panel     Status: Abnormal   Collection Time: 09/11/16  6:13 AM  Result Value Ref Range   Cholesterol 211 (H) 0 - 200 mg/dL   Triglycerides 102 <725 mg/dL   HDL 87 >36 mg/dL   Total CHOL/HDL Ratio 2.4 RATIO   VLDL 20 0 - 40 mg/dL   LDL Cholesterol 644 (H) 0 - 99 mg/dL    Comment:        Total Cholesterol/HDL:CHD  Risk Coronary Heart Disease Risk Table                     Men   Women  1/2 Average Risk   3.4   3.3  Average Risk       5.0   4.4  2 X Average Risk   9.6   7.1  3 X Average Risk  23.4   11.0        Use the calculated Patient Ratio above and the CHD Risk Table to determine the patient's CHD Risk.        ATP III CLASSIFICATION (LDL):  <100     mg/dL   Optimal  034-742  mg/dL   Near or Above                    Optimal  130-159  mg/dL   Borderline  595-638  mg/dL   High  >756     mg/dL   Very High Performed at [facility], [address] [place], [place] [address]   Hemoglobin A1c     Status: Abnormal   Collection Time: 09/11/16  6:13 AM  Result Value Ref Range   Hgb A1c MFr Bld 4.7 (L) 4.8 - 5.6 %    Comment: (NOTE)         Pre-diabetes: 5.7 - 6.4         Diabetes: >6.4         Glycemic control for adults with diabetes: <7.0    Mean Plasma Glucose 88 mg/dL    Comment: (NOTE) Performed At: [facility] [address] [place], [place] [id number] Mila Homer MD [id number] Performed at [facility], [address], [place], [place] [address]   Prolactin     Status: None   Collection Time: 09/11/16  6:13 AM  Result Value Ref Range   Prolactin 8.1 4.0 - 15.2 ng/mL    Comment: (NOTE) Performed At: [facility] [address] [place], [place] [id number] Mila Homer MD [id number] Performed at [facility], [address], [place], [place] [address]     Blood Alcohol level:  Lab Results  Component Value Date   [facility] <5 09/04/2016    Metabolic Disorder Labs: Lab Results  Component Value Date   HGBA1C 4.7 (L) 09/11/2016   MPG 88 09/11/2016   Lab Results  Component Value  Date   PROLACTIN 8.1 09/11/2016   Lab Results  Component Value Date   CHOL 211 (H) 09/11/2016   TRIG 102 09/11/2016   HDL 87 09/11/2016   CHOLHDL 2.4 09/11/2016   VLDL 20 09/11/2016   LDLCALC 104 (H) 09/11/2016     Physical Findings: AIMS: Facial and Oral Movements Muscles of Facial Expression: None, normal Lips and Perioral Area: None, normal Jaw: None, normal Tongue: None, normal,Extremity Movements Upper (arms, wrists, hands, fingers): None, normal Lower (legs, knees, ankles, toes): None, normal, Trunk Movements Neck, shoulders, hips: None, normal, Overall Severity Severity of abnormal movements (highest score from questions above): None, normal Incapacitation due to abnormal movements: None, normal Patient's awareness of abnormal movements (rate only patient's report): No Awareness, Dental Status Current problems with teeth and/or dentures?: No Does patient usually wear dentures?: No  CIWA:  CIWA-Ar Total: 1 COWS:  COWS Total Score: 2  Musculoskeletal: Strength & Muscle Tone: within normal limits Gait & Station: normal Patient leans: N/A  Psychiatric Specialty Exam: Physical Exam  Nursing note and vitals reviewed.   Review of Systems  Psychiatric/Behavioral: Positive for depression. The patient is nervous/anxious.   All other systems reviewed and are negative.   Blood pressure 131/82, pulse 95, temperature 98.4 F (36.9 C), temperature source Oral, resp. rate 16, height 5\' 11"  (1.803 m), weight 65.3 kg (144 lb).Body mass index is 20.08 kg/m.  General Appearance:calm  Eye Contact:  Minimal  Speech:  Normal Rate  Volume:  Decreased  Mood:  Less anxious   Affect:  Constricted  Thought Process:  Linear and Descriptions of Associations: Intact  Orientation:  Other:  person, situation  Thought Content: less paranoid  Suicidal Thoughts:  No  Homicidal Thoughts:  No  Memory:  Immediate;   Fair Recent;   Fair Remote;   Fair  Judgement:  Fair  Insight:  Shallow  Psychomotor Activity:  Normal  Concentration: fair  Recall:  [employer]Fair  Fund of Knowledge:  Fair  Language:  Fair  Akathisia:  No  Handed:    AIMS (if indicated):     Assets:  Housing  ADL's:  Intact  Cognition:   WNL  Sleep:  Number of Hours: 6.75    Schizophrenia, paranoid (HCC) improving  Will continue today 09/12/16 plan as below except where it is noted.   Treatment Plan Summary:Patient is a [age] y old AAM , who presented with paranoia , delusions , is making progress, continue treatment.  Pt signed a 72 hr discharge application on 09/11/2016.    Plan:  Will continue Abilify 30 mg po at bedtime for psychosis.will offer Abilify Maintena IM 400 mg prior to DC. Will continue Trazodone 50 mg po qhs prn for sleep issues. EKG reviewed - qtc wnl. Labs reviewed tsh - wnl, lipid panel - wl. CSW will continue to work on disposition.   Nija Koopman, MD 09/12/2016, 4:01 PM

## 2016-09-12 NOTE — [facility] Group Notes (Signed)
[facility] LCSW Group Therapy 09/12/2016 1:15 PM  Type of Therapy: Group Therapy- Emotion Regulation  Participation Level: Reserved  Participation Quality:  Appropriate  Affect: Appropriate  Cognitive: Alert and Oriented   Insight:  Developing/Improving  Engagement in Therapy: Developing/Improving and Engaged   Modes of Intervention: Clarification, Confrontation, Discussion, Education, Exploration, Limit-setting, Orientation, Problem-solving, Rapport Building, Dance movement psychotherapisteality Testing, Socialization and Support  Summary of Progress/Problems: The topic for group today was emotional regulation. This group focused on both positive and negative emotion identification and allowed group members to process ways to identify feelings, regulate negative emotions, and find healthy ways to manage internal/external emotions. Group members were asked to reflect on a time when their reaction to an emotion led to a negative outcome and explored how alternative responses using emotion regulation would have benefited them. Group members were also asked to discuss a time when emotion regulation was utilized when a negative emotion was experienced. Pt participated voluntarily without prompting. He identified anger as a difficult emotion to regulate and expressed that family conflict is often a trigger. He was able to articulate warning signs such as his body temperature rising.    Vernie ShanksLauren Chamberlain Steinborn, LCSW 09/12/2016 4:17 PM

## 2016-09-12 NOTE — Progress Notes (Signed)
   D: Pt stated that his day was "alright". Stated he doesn't have any thoughts of harming himself or others. Pt mentioned that the abilify tends to make him sleepy, and that orders have been changed to take the med at hs,, since it makes him sleepy. Pt has no questions or concerns.    A:  Support and encouragement was offered. 15 min checks continued for safety.  R: Pt remains safe.

## 2016-09-12 NOTE — Progress Notes (Signed)
Pt attend wrap up group. His day was a 5 his goal was to stay occuiped pt said his goal was to stay positive

## 2016-09-12 NOTE — Plan of Care (Signed)
Problem: Physical Regulation: Goal: Ability to maintain clinical measurements within normal limits will improve Outcome: Progressing Patient's VSS.

## 2016-09-13 MED ORDER — ARIPIPRAZOLE ER 400 MG IM SRER
400.0000 mg | INTRAMUSCULAR | Status: DC
  Administered 2016-09-13: 400 mg via INTRAMUSCULAR

## 2016-09-13 NOTE — Plan of Care (Signed)
Problem: Education: Goal: Verbalization of understanding the information provided will improve Outcome: Progressing Patient is slow to process information but does verbalize understanding.  Problem: Safety: Goal: Periods of time without injury will increase Outcome: Progressing Patient has not engaged in self harm. Denies SI.

## 2016-09-13 NOTE — [facility] Group Notes (Signed)
[facility] Group Notes:  (Nursing/MHT/Case Management/Adjunct)  Date:  09/13/2016  Time:  0900  Type of Therapy:  Nurse Education - Unit Orientation  Participation Level:  Did Not Attend  Participation Quality:    Affect:    Cognitive:    Insight:    Engagement in Group:    Modes of Intervention:    Summary of Progress/Problems: Invited but did not attend.  Merian CapronFriedman, Ingri Diemer [facility] 09/13/2016, 9:47 AM

## 2016-09-13 NOTE — Progress Notes (Signed)
[facility]  09/13/2016 4:45 PM [name]  MRN:  [id number]  Subjective: Patient states "I don't want to take Abilify injection. I know what this medicine can do to my body. My body is not like any one else's. I'm not talking about mental stuff, this time, I mean medical stuff that my body go through when I take this medicine. Why are you holding me against my will here. I have to talk to my family first, then I will decide if I want this injection".  Objective: Shane ArtistDerrick is seen, chart reviewed. Discussed patient with treatment team.  Pt seen as guarded, but calm and cooperative. He remains paranoid & suspicious of his medications. He keeps to himself, walking along the hall ways or sits by himself a lot. No disruptive behavior on the unit. He is communicating more which is an improvement. He continues to have some delusions, but does not seem to be preoccupied with it. Pt per staff , is seen as participating in groups more, continues to need support. Oval came back to the staff later after refusing to take the Abilify injection & requested to be given the injection. Patient is reassured that staff will keep eye on him to assure he is doing well after receiving the injection.  Principal Problem: Schizophrenia, paranoid (HCC) Diagnosis:   Patient Active Problem List   Diagnosis Date Noted  . Cannabis use disorder, severe, dependence (HCC) [F12.20] 09/10/2016  . Schizophrenia, paranoid (HCC) [F20.0] 09/06/2016   Total Time spent with patient: 15 minutes  Past Psychiatric History: As in H&P  Past Medical History:  Past Medical History:  Diagnosis Date  . Asthma    History reviewed. No pertinent surgical history. Family History: Please see H&P.  Family Psychiatric  History: Please see H&P.  Social History:  History  Alcohol Use  . Yes    Comment: occasionally     History  Drug Use No    Social History   Social History  . Marital status: Single    Spouse name:  N/A  . Number of children: N/A  . Years of education: N/A   Social History Main Topics  . Smoking status: Current Some Day Smoker  . Smokeless tobacco: Former NeurosurgeonUser  . Alcohol use Yes     Comment: occasionally  . Drug use: No  . Sexual activity: Not Asked   Other Topics Concern  . None   Social History Narrative  . None   Additional Social History:   Sleep: Fair  Appetite:  Fair  Current Medications: Current Facility-Administered Medications  Medication Dose Route Frequency Provider Last Rate Last Dose  . acetaminophen (TYLENOL) tablet 650 mg  650 mg Oral Q6H PRN Adonis BrookAgustin, Sheila, NP   650 mg at 09/09/16 2134  . albuterol (PROVENTIL HFA;VENTOLIN HFA) 108 (90 Base) MCG/ACT inhaler 2 puff  2 puff Inhalation Q6H PRN Kerry HoughSimon, Spencer E, PA-C   2 puff at 09/10/16 2003  . alum & mag hydroxide-simeth (MAALOX/MYLANTA) 200-200-20 MG/5ML suspension 30 mL  30 mL Oral Q4H PRN Adonis BrookAgustin, Sheila, NP      . ARIPiprazole (ABILIFY) tablet 30 mg  30 mg Oral QHS Jomarie LongsEappen, Saramma, MD   30 mg at 09/12/16 2120  . ARIPiprazole ER SRER 400 mg  400 mg Intramuscular Q28 days Jomarie LongsEappen, Saramma, MD   400 mg at 09/13/16 1309  . benztropine (COGENTIN) tablet 0.5 mg  0.5 mg Oral BID Armandina StammerNwoko, Agnes I, NP   0.5 mg at 09/13/16 0801  .  ibuprofen (ADVIL,MOTRIN) tablet 600 mg  600 mg Oral Q8H PRN Adonis Brook, NP   600 mg at 09/09/16 2134  . LORazepam (ATIVAN) tablet 1 mg  1 mg Oral Q8H PRN Adonis Brook, NP   1 mg at 09/05/16 2154  . magnesium hydroxide (MILK OF MAGNESIA) suspension 30 mL  30 mL Oral Daily PRN Adonis Brook, NP      . OLANZapine (ZYPREXA) tablet 5 mg  5 mg Oral TID PRN Jomarie Longs, MD       Or  . OLANZapine (ZYPREXA) injection 5 mg  5 mg Intramuscular TID PRN Jomarie Longs, MD      . ondansetron (ZOFRAN) tablet 4 mg  4 mg Oral Q8H PRN Adonis Brook, NP      . traZODone (DESYREL) tablet 50 mg  50 mg Oral QHS PRN Adonis Brook, NP   50 mg at 09/12/16 2120   Lab Results:  No results found  for this or any previous visit (from the past 48 hour(s)).  Blood Alcohol level:  Lab Results  Component Value Date   ETH <5 09/04/2016   Metabolic Disorder Labs: Lab Results  Component Value Date   HGBA1C 4.7 (L) 09/11/2016   MPG 88 09/11/2016   Lab Results  Component Value Date   PROLACTIN 8.1 09/11/2016   Lab Results  Component Value Date   CHOL 211 (H) 09/11/2016   TRIG 102 09/11/2016   HDL 87 09/11/2016   CHOLHDL 2.4 09/11/2016   VLDL 20 09/11/2016   LDLCALC 104 (H) 09/11/2016   Physical Findings: AIMS: Facial and Oral Movements Muscles of Facial Expression: None, normal Lips and Perioral Area: None, normal Jaw: None, normal Tongue: None, normal,Extremity Movements Upper (arms, wrists, hands, fingers): None, normal Lower (legs, knees, ankles, toes): None, normal, Trunk Movements Neck, shoulders, hips: None, normal, Overall Severity Severity of abnormal movements (highest score from questions above): None, normal Incapacitation due to abnormal movements: None, normal Patient's awareness of abnormal movements (rate only patient's report): No Awareness, Dental Status Current problems with teeth and/or dentures?: No Does patient usually wear dentures?: No  CIWA:  CIWA-Ar Total: 1 COWS:  COWS Total Score: 2  Musculoskeletal: Strength & Muscle Tone: within normal limits Gait & Station: normal Patient leans: N/A  Psychiatric Specialty Exam: Physical Exam  Nursing note and vitals reviewed.   Review of Systems  Psychiatric/Behavioral: Positive for depression and hallucinations (Paranoia, delusional). Negative for memory loss, substance abuse and suicidal ideas. The patient is nervous/anxious and has insomnia ("Improving").   All other systems reviewed and are negative.   Blood pressure 127/69, pulse 87, temperature 97.4 F (36.3 C), temperature source Oral, resp. rate 16, height 5\' 11"  (1.803 m), weight 65.3 kg (144 lb).Body mass index is 20.08 kg/m.  General  Appearance:calm  Eye Contact:  Minimal  Speech:  Normal Rate  Volume:  Decreased  Mood:  Less anxious   Affect:  Constricted  Thought Process:  Linear and Descriptions of Associations: Intact  Orientation:  Other:  person, situation  Thought Content: less paranoid  Suicidal Thoughts:  No  Homicidal Thoughts:  No  Memory:  Immediate;   Fair Recent;   Fair Remote;   Fair  Judgement:  Fair  Insight:  Shallow  Psychomotor Activity:  Normal  Concentration: fair  Recall:  [employer] of Knowledge:  Fair  Language:  Fair  Akathisia:  No  Handed:    AIMS (if indicated):     Assets:  Housing  ADL's:  Intact  Cognition:  WNL  Sleep:  Number of Hours: 6.25    Schizophrenia, paranoid (HCC) improving  Will continue today 09/13/16 plan as below except where it is noted.  Treatment Plan Summary: Patient is a 61 y old AAM , who presented with paranoia, delusions, is making progress, continue treatment.  Pt signed a 72 hr discharge application on 09/11/2016.   Plan: Will continue Abilify 30 mg po at bedtime for psychosis. Abilify Maintena IM 400 mg administered today 09-13-16. Will continue Trazodone 50 mg po qhs prn for sleep issues. Will continue Cogentin 0.5 mg bid for prevention of EPS. EKG reviewed - qtc wnl. Labs reviewed tsh - wnl, lipid panel - wl. CSW will continue to work on disposition.  Sanjuana Kava, NP, PMHNP, FNP-BC 09/13/2016, 4:45 PMPatient ID: [name], male   DOB: 1986/01/23, 31 y.o.   MRN: [id number] Agree with NP Progress Note

## 2016-09-13 NOTE — [facility] Group Notes (Signed)
[facility] LCSW Group Therapy 09/13/2016 1:15pm  Type of Therapy: Group Therapy- Balance in Life  Participation Level: Reserved  Description of the Group:  The topic for group was balance in life. Today's group focused on defining balance in one's own words, identifying things that can knock one off balance, and exploring healthy ways to maintain balance in life. Group members were asked to provide an example of a time when they felt off balance, describe how they handled that situation,and process healthier ways to regain balance in the future. Group members were asked to share the most important tool for maintaining balance [facility] and how they plan to apply this method after discharge.  Summary of Patient Progress Pt was able to identify signs that he was feeling unbalanced such as increased irritability and conflict with others. Pt remained attentive throughout.    Therapeutic Modalities:   Cognitive Behavioral Therapy Solution-Focused Therapy Assertiveness Training   Vernie ShanksLauren Timeka Goette, LCSW 09/13/2016 7:12 PM

## 2016-09-13 NOTE — Progress Notes (Signed)
  D: When asked about his day pt stated, "the dr gave me an equalizer because I was having trouble reading today". Stated he "couldn't stay focused". When asked about the visit he had with his mom and dad, pt stated, "it was good. We conversated". Pt doesn't go into any details with the Clinical research associatewriter. Pt has no questions or concerns.    A:  Support and encouragement was offered. 15 min checks continued for safety.  R: Pt remains safe.

## 2016-09-13 NOTE — Progress Notes (Addendum)
Adult Psychoeducational Group Note  Date:  09/13/2016 Time:  2:21 AM  Group Topic/Focus:  Wrap-Up Group:   The focus of this group is to help patients review their daily goal of treatment and discuss progress on daily workbooks.  Participation Level:  Minimal  Participation Quality:  Appropriate and Attentive  Affect:  Appropriate  Cognitive:  Appropriate  Insight: Improving  Engagement in Group:  Developing/Improving  Modes of Intervention:  Discussion  Additional Comments:  Pt shared he spoke with the doctor about his medication adjustment. Pt was very reserved during group but pleasant. Pt also shared he wanted to get his medication for anxiety under control. Karleen HampshireFox, Devone Bonilla Brittini 09/13/2016, 2:21 AM

## 2016-09-13 NOTE — Progress Notes (Signed)
D: Spoke with patient 1:1 who is up and visible in milieu however keeps to self. Observed wandering in hallway, staring off. Patient's affect flat, suspicious in affect with congruent mood. Rating depression at a 6/10, hopelessness and anxiety both at a 5/10. Rates sleep as fair, appetite as fair, energy as normal and concentration as good.  States goal for today is to "stay busy, keep my mind clear/calm. Try to read mags, do puzzles." Denies pain, physical problems.   A: Medicated per orders, no prns requested or required. Abilify long acting IM given after much education and reassurance. Emotional support offered and self inventory reviewed. Discussed POC with MD, SW.   R: Patient verbalizes understanding of POC. Patient remains paranoid, inspecting medications multiple times. He denies AVH though remains preoccupied. He denies SI/HI and remains safe on level III obs.

## 2016-09-14 MED ORDER — BENZTROPINE MESYLATE 0.5 MG PO TABS: 0.5000 mg | ORAL_TABLET | Freq: Two times a day (BID) | ORAL | 0 refills | Status: DC

## 2016-09-14 MED ORDER — ARIPIPRAZOLE 30 MG PO TABS: 30.0000 mg | ORAL_TABLET | Freq: Every day | ORAL | 0 refills | Status: DC

## 2016-09-14 MED ORDER — ARIPIPRAZOLE ER 400 MG IM SRER: 400.0000 mg | INTRAMUSCULAR | 0 refills | Status: DC

## 2016-09-14 MED ORDER — TRAZODONE HCL 50 MG PO TABS: 50.0000 mg | ORAL_TABLET | Freq: Every evening | ORAL | 0 refills | Status: DC | PRN

## 2016-09-14 MED ORDER — ALBUTEROL SULFATE HFA 108 (90 BASE) MCG/ACT IN AERS: 2.0000 | INHALATION_SPRAY | RESPIRATORY_TRACT | 0 refills | Status: DC | PRN

## 2016-09-14 NOTE — Discharge Summary (Signed)
Physician Discharge Summary Note  Patient:  Shane Small is an 31 y.o., male  MRN:  [id number]  DOB:  1985-12-05  Patient phone:  [phone] (home)   Patient address:   [address] [place] [address],   Total Time spent with patient: Greater than 30 minutes  Date of Admission:  09/05/2016 Date of Discharge: 09-14-16  Reason for Admission: Worsening symptoms of Schizophrenia, Cannabis use disorder, severe.  Principal Problem: Schizophrenia, paranoid [facility]  Discharge Diagnoses: Patient Active Problem List   Diagnosis Date Noted  . Cannabis use disorder, severe, dependence (HCC) [F12.20] 09/10/2016  . Schizophrenia, paranoid (HCC) [F20.0] 09/06/2016   Past Psychiatric History: Schizophrenia, paranoia, Cannabis use disorder.  Past Medical History:  Past Medical History:  Diagnosis Date  . Asthma    History reviewed. No pertinent surgical history.  Family History: History reviewed. No pertinent family history.  Family Psychiatric  History: See Md's SRA  Social History:  History  Alcohol Use  . Yes    Comment: occasionally     History  Drug Use No    Social History   Social History  . Marital status: Single    Spouse name: N/A  . Number of children: N/A  . Years of education: N/A   Social History Main Topics  . Smoking status: Current Some Day Smoker  . Smokeless tobacco: Former Neurosurgeon  . Alcohol use Yes     Comment: occasionally  . Drug use: No  . Sexual activity: Not Asked   Other Topics Concern  . None   Social History Narrative  . None   Hospital Course: 31 yo AAM, single, unemployed, lives with his parents. No past history of mental illness. Presented to the ER in company of his father. Repeated ER visits on account of somatic complaints. Reports tactile hallucinations in his extremities. Has been expressing paranoid delusions about being poisoned at [employer] years ago. Believes that the water was contaminated with E. Coli and  radiation. Has beliefs that this is what is causing his tactile hallucinations. Reports ideas of reference from the TV for the past couple of weeks. Reported to abuse cocaine and THC though UDS was negative at presentation. Drove dangerously to [employer] based on his delusions. Was booked for dangerous driving.  Father is very concerned as he has been very bizarre. Father is concerned that he might be potentially homicidal.  At interview, he reports hearing people outside the house "talking crazy". Says when he complains to his family, no one takes him seriously. Feels they talk about him on TV. Says he is concerned they is a conspiracy against him. He is not sure of who is behind this.  He was very stressed a couple of weeks ago. Says he tried to cut his wrist. Patient is not feeling suicidal at this time. Says he is very watchful. He feels there is some people out to get him. Not expressing any homicidal thoughts towards anyone in particular.  He has not been eating as much as he used to. He has lost a bit of weight. Says he lost his job a couple of months ago because he was not getting along with anyone at work. He does not have friends. He states that his relationship with his child's mom has been stressful. Patient admits use of THC. Not sure if he uses synthetic or natural product. Says last use was over three months ago. Denies use of any other illicit substance.   Upon his admision  to the adult unit, [name] was evaluated & his presenting symptoms identified. The medication management for the presenting symptoms were discussed & initiated targeting those symptoms. He was enrolled in the group counseling sessions & encouraged to participate in the unit programming. His other pre-existing medical problems were identified & his home medications restarted accordingly. [name] was evaluated on daily basis by the clinical providers to assure his response to the treatment regimen.As his treatment  progressed,  improvement was gradually noted as evidenced by his report of decreasing symptoms, improved sleep, mood, affect, medication tolerance & active participation in the unit programming. He was encouraged to update his providers on his progress by daily completion of a self inventory assessment, noting mood, mental status, pain, any new symptoms, anxiety and or concerns.  Rick's symptoms responded well to his treatment regimen combined with a therapeutic and supportive environment. Kamerin's symptoms eventually responded well to his treatment regimen. He is now stable for discharge to continue mental health care on an outpatient basis as noted below. He is provided with all the necessary information needed to make this appointment without problems. Thamas was medicated & discharged on; Abilify 30 mg for mood control, Abilify ER injectable 400 mg Q 28 days for mood control, Cogentin 0.5 mg for EPS & Trazodone 50 mg for insomnia.  Upon discharge, [name] was in much improved condition than upon admission.His symptoms were reported as significantly decreased or resolved completely. He adamantly denies any SI/HI,  AVH, delusional thoughts & or paranoia. He was motivated to continue taking medication with a goal of continued improvement in mental health. He will continue psychiatric care on an outpatient basis as noted below. [facility] belongings in no apparent distress. Transportation per mother.  Physical Findings: AIMS: Facial and Oral Movements Muscles of Facial Expression: None, normal Lips and Perioral Area: None, normal Jaw: None, normal Tongue: None, normal,Extremity Movements Upper (arms, wrists, hands, fingers): None, normal Lower (legs, knees, ankles, toes): None, normal, Trunk Movements Neck, shoulders, hips: None, normal, Overall Severity Severity of abnormal movements (highest score from questions above): None, normal Incapacitation due to abnormal  movements: None, normal Patient's awareness of abnormal movements (rate only patient's report): No Awareness, Dental Status Current problems with teeth and/or dentures?: No Does patient usually wear dentures?: No  CIWA:  CIWA-Ar Total: 1 COWS:  COWS Total Score: 2  Musculoskeletal: Strength & Muscle Tone: within normal limits Gait & Station: normal Patient leans: N/A  Psychiatric Specialty Exam: Physical Exam  Constitutional: He is oriented to person, place, and time. He appears well-developed.  HENT:  Head: Normocephalic.  Eyes: Pupils are equal, round, and reactive to light.  Neck: Normal range of motion.  Cardiovascular: Normal rate.   Respiratory: Effort normal.  GI: Soft.  Genitourinary:  Genitourinary Comments: Deferred  Musculoskeletal: Normal range of motion.  Neurological: He is alert and oriented to person, place, and time.  Skin: Skin is warm and dry.    Review of Systems  Constitutional: Negative.   HENT: Negative.   Eyes: Negative.   Respiratory: Negative.   Cardiovascular: Negative.   Gastrointestinal: Negative.   Genitourinary: Negative.   Musculoskeletal: Negative.   Skin: Negative.   Neurological: Negative.   Endo/Heme/Allergies: Negative.   Psychiatric/Behavioral: Positive for depression (Stable) and hallucinations (Hx. Paranoia, Psychosis). Negative for memory loss, substance abuse and suicidal ideas. The patient has insomnia (Stable). The patient is not nervous/anxious.     Blood pressure 114/75, pulse 86, temperature 98.1 F (36.7 C),  temperature source Oral, resp. rate 16, height 5\' 11"  (1.803 m), weight 65.3 kg (144 lb).Body mass index is 20.08 kg/m.  See Md's SRA   Have you used any form of tobacco in the last 30 days? (Cigarettes, Smokeless Tobacco, Cigars, and/or Pipes): Yes  Has this patient used any form of tobacco in the last 30 days? (Cigarettes, Smokeless Tobacco, Cigars, and/or Pipes): No  Blood Alcohol level:  Lab Results  Component  Value Date   ETH <5 09/04/2016   Metabolic Disorder Labs:  Lab Results  Component Value Date   HGBA1C 4.7 (L) 09/11/2016   MPG 88 09/11/2016   Lab Results  Component Value Date   PROLACTIN 8.1 09/11/2016   Lab Results  Component Value Date   CHOL 211 (H) 09/11/2016   TRIG 102 09/11/2016   HDL 87 09/11/2016   CHOLHDL 2.4 09/11/2016   VLDL 20 09/11/2016   LDLCALC 104 (H) 09/11/2016   See Psychiatric Specialty Exam and Suicide Risk Assessment completed by Attending Physician prior to discharge.  Discharge destination:  Home  Is patient on multiple antipsychotic therapies at discharge:  No   Has Patient had three or more failed trials of antipsychotic monotherapy by history:  No  Recommended Plan for Multiple Antipsychotic Therapies: NA  Allergies as of 09/14/2016   No Known Allergies     Medication List    STOP taking these medications   OVER THE COUNTER MEDICATION   OVER THE COUNTER MEDICATION     TAKE these medications     Indication  albuterol 108 (90 Base) MCG/ACT inhaler Commonly known as:  PROVENTIL HFA;VENTOLIN HFA Inhale 2 puffs into the lungs every 4 (four) hours as needed for wheezing or shortness of breath.  Indication:  Asthma   ARIPiprazole 30 MG tablet Commonly known as:  ABILIFY Take 1 tablet (30 mg total) by mouth at bedtime. For mood control  Indication:  Mood control   ARIPiprazole ER 400 MG Srer Inject 400 mg into the muscle every 28 (twenty-eight) days. (Due 10-11-16): For mood control Start taking on:  10/11/2016  Indication:  Schizophrenia   benztropine 0.5 MG tablet Commonly known as:  COGENTIN Take 1 tablet (0.5 mg total) by mouth 2 (two) times daily. For prevention of drug induced tremors.  Indication:  Extrapyramidal Reaction caused by Medications   traZODone 50 MG tablet Commonly known as:  DESYREL Take 1 tablet (50 mg total) by mouth at bedtime as needed for sleep.  Indication:  Trouble Sleeping      Follow-up Information     [facility] Follow up.   Specialty:  Behavioral Health Why:  Please walk-in within 1-3 days of discharge to be established for medication management and therapy. Walk-in hours are from Monday-Friday between 8am-3pm. Contact information: [address] [place] [address] [phone]          Follow-up recommendations: Activity:  As tolerated Diet: As recommended by your primary care doctor. Keep all scheduled follow-up appointments as recommended.    Comments: Patient is instructed prior to discharge to: Take all medications as prescribed by his/her mental healthcare provider. Report any adverse effects and or reactions from the medicines to his/her outpatient provider promptly. Patient has been instructed & cautioned: To not engage in alcohol and or illegal drug use while on prescription medicines. In the event of worsening symptoms, patient is instructed to call the crisis hotline, 911 and or go to the nearest ED for appropriate evaluation and treatment of symptoms. To follow-up with  his/her primary care provider for your other medical issues, concerns and or health care needs.   Signed: Sanjuana Kava, NP, PMHNP, FNP-BC 09/14/2016, 10:19 AM   Patient seen, Suicide Assessment Completed.  Disposition Plan Reviewed

## 2016-09-14 NOTE — Progress Notes (Signed)
Pt did not attend karaoke group. Pt remained on the unit in his room. Pt encouraged to attend.

## 2016-09-14 NOTE — [facility] Suicide Risk Assessment (Addendum)
[facility] Discharge Suicide Risk Assessment   Principal Problem: Schizophrenia, paranoid [facility]) Discharge Diagnoses:  Patient Active Problem List   Diagnosis Date Noted  . Cannabis use disorder, severe, dependence (HCC) [F12.20] 09/10/2016  . Schizophrenia, paranoid (HCC) [F20.0] 09/06/2016    Total Time spent with patient: 30 minutes  Musculoskeletal: Strength & Muscle Tone: within normal limits Gait & Station: normal Patient leans: N/A  Psychiatric Specialty Exam: ROS denies headache, no chest pain , no shortness of breath  Blood pressure 114/75, pulse 86, temperature 98.1 F (36.7 C), temperature source Oral, resp. rate 16, height 5\' 11"  (1.803 m), weight 65.3 kg (144 lb).Body mass index is 20.08 kg/m.  General Appearance: Well Groomed  Patent attorney::  Fair  Speech:  Normal Rate409  Volume:  Normal  Mood:  denies depression, states mood is "OK"  Affect:  restricted   Thought Process:  Linear and Descriptions of Associations: Intact  Orientation:  Other:  fully alert and attentive   Thought Content:  denies hallucinations, and does not appear internally preoccupied, no delusions are expressed at this time  Suicidal Thoughts:  No denies any suicidal or self injurious ideations, no homicidal or violent ideations  Homicidal Thoughts:  No  Memory:  recent and remote grossly intact   Judgement:  Other:  improving   Insight:  improving   Psychomotor Activity:  Normal  Concentration:  Good  Recall:  Good  Fund of Knowledge:Good  Language: Good  Akathisia:  Negative  Handed:  Right  AIMS (if indicated):     Assets:  Communication Skills Desire for Improvement Physical Health Resilience  Sleep:  Number of Hours: 6.5  Cognition: WNL  ADL's:  Intact   Mental Status Per Nursing Assessment::   On Admission:  NA  Demographic Factors:  31 year old single male, lives with father, has a 14 year old daughter , who lives with mother, unemployed   Loss  Factors: Unemployment.  Historical Factors: No prior psychiatric admissions, denies history of suicide attempts , history of paranoid ideations   Risk Reduction Factors:   Sense of responsibility to family, Living with another person, especially a relative and Positive coping skills or problem solving skills  Continued Clinical Symptoms:  At this time patient is alert, attentive, reports feeling better , denies depression, describes mood as " pretty good" affect is mildly restricted, but does smile briefly at times, no thought disorder, behavior calm, no restlessness or agitation.  *As per chart / initial note patient had expressed paranoid ideations of having [place] [employer] [place], and had self referential ideations when watching TV. At this time denies hallucinations, and does not appear internally preoccupied. Patient does not express delusions at this time, and states he is no longer concerned about above issues. Denies any self referential ideations .  Behavior on unit in good control. Denies medication side effects. Received Abilify SRER IM on 5/17, and remains on PO Abilify . No akathisia .   Cognitive Features That Contribute To Risk:  No gross cognitive deficits noted upon discharge. Is alert , attentive, and oriented x 3    Suicide Risk:  Mild:  Suicidal ideation of limited frequency, intensity, duration, and specificity.  There are no identifiable plans, no associated intent, mild dysphoria and related symptoms, good self-control (both objective and subjective assessment), few other risk factors, and identifiable protective factors, including available and accessible social support.  Follow-up Information    [facility] Follow up.   Specialty:  Behavioral Health Why:  Please walk-in within 1-3 days of discharge to be established for medication management and therapy. Walk-in hours are from Monday-Friday between 8am-3pm. Contact information: [address] [place] [address] [phone]           Plan Of Care/Follow-up recommendations:  Activity:  as tolerated  Diet:  Regular Tests:  NA Other:  See below  Patient is requesting discharge- there are no current grounds for involuntary commitment  Patient is planning on going home . Lives with father. Follow up as above.  Encouraged to abstain from illicit drugs, including cannabis.   Craige CottaFernando A Cobos, MD 09/14/2016, 1:30 PM

## 2016-09-14 NOTE — Progress Notes (Signed)
  [facility] Adult [facility] Discharge Plan :  Will you be returning to the same living situation after discharge:  Yes,  home At discharge, do you have transportation home?: Yes,  mother  Do you have the ability to pay for your medications: Yes,  mental health   Release of information consent forms completed and in the chart;  Patient's signature needed at discharge.  Patient to Follow up at: Follow-up Information    [facility] Follow up.   Specialty:  Behavioral Health Why:  Please walk-in within 1-3 days of discharge to be established for medication management and therapy. Walk-in hours are from Monday-Friday between 8am-3pm. Contact information: [address] [place] [address] [phone]           Next level of care provider has access to [facility] Link:no  Safety Planning and Suicide Prevention discussed: Yes,  with patient   Have you used any form of tobacco in the last 30 days? (Cigarettes, Smokeless Tobacco, Cigars, and/or Pipes): Yes  Has patient been referred to the Quitline?: Patient refused referral  Patient has been referred for addiction treatment: Yes  Daisy Floroandace L Berdia Lachman MSW, LCSWA  09/14/2016, 11:42 AM

## 2016-09-14 NOTE — Progress Notes (Signed)
Data. Patient denies SI/HI/AVH. Patient is very watchful and isolative. Affect is blunt, and does not brighten with interaction. Somewhat irritable.   Action. Emotional support and encouragement offered. Education provided on medication, indications and side effect. Q 15 minute checks done for safety. Response. Safety on the unit maintained through 15 minute checks.  Medications taken as prescribed. Attended groups. Remained calm and appropriate through out shift.  Pt. discharged to lobby. Belongings sheet reviewed and signed by pt. and all belongings, including scripts and medication samples,  sent home. Paperwork reviewed and pt. able to verbalize understanding of education. Pt. in no current distress and ambulatory.

## 2016-09-14 NOTE — Progress Notes (Signed)
Recreation Therapy Notes  Date: 09/14/16 Time: 0930 Location: [address] Dayroom  Group Topic: Stress Management  Goal Area(s) Addresses:  Patient will verbalize importance of using healthy stress management.  Patient will identify positive emotions associated with healthy stress management.   Behavioral Response: Engaged  Intervention: Stress Management  Activity :  Guided Imagery.  LRT introduced the stress management technique of guided imagery.  LRT read a script to guide patients and engage them in the technique.  Patients were to follow along as LRT read script to fully participate in the technique.  Education:  Stress Management, Discharge Planning.   Education Outcome: Acknowledges edcuation/In group clarification offered/Needs additional education  Clinical Observations/Feedback: Pt attended group.   Caroll RancherMarjette Katelee Schupp, LRT/CTRS         Caroll RancherLindsay, Montasia Chisenhall A 09/14/2016 12:09 PM

## 2016-09-14 NOTE — Tx Team (Signed)
Interdisciplinary Treatment and Diagnostic Plan Update  09/14/2016 Time of Session: 12:04 PM  OTT ZIMMERLE MRN: [id number]  Principal Diagnosis: Schizophrenia, paranoid [facility])  Secondary Diagnoses: Principal Problem:   Schizophrenia, paranoid ([place]) Active Problems:   Cannabis use disorder, severe, dependence ([place])   Current Medications:  Current Facility-Administered Medications  Medication Dose Route Frequency Provider Last Rate Last Dose  . acetaminophen (TYLENOL) tablet 650 mg  650 mg Oral Q6H PRN Kerrie Buffalo, NP   650 mg at 09/09/16 2134  . albuterol (PROVENTIL HFA;VENTOLIN HFA) 108 (90 Base) MCG/ACT inhaler 2 puff  2 puff Inhalation Q6H PRN Laverle Hobby, PA-C   2 puff at 09/10/16 2003  . alum & mag hydroxide-simeth (MAALOX/MYLANTA) 200-200-20 MG/5ML suspension 30 mL  30 mL Oral Q4H PRN Kerrie Buffalo, NP      . ARIPiprazole (ABILIFY) tablet 30 mg  30 mg Oral QHS Ursula Alert, MD   30 mg at 09/13/16 2157  . ARIPiprazole ER SRER 400 mg  400 mg Intramuscular Q28 days Ursula Alert, MD   400 mg at 09/13/16 1309  . benztropine (COGENTIN) tablet 0.5 mg  0.5 mg Oral BID Lindell Spar I, NP   0.5 mg at 09/14/16 0744  . ibuprofen (ADVIL,MOTRIN) tablet 600 mg  600 mg Oral Q8H PRN Kerrie Buffalo, NP   600 mg at 09/09/16 2134  . LORazepam (ATIVAN) tablet 1 mg  1 mg Oral Q8H PRN Kerrie Buffalo, NP   1 mg at 09/05/16 2154  . magnesium hydroxide (MILK OF MAGNESIA) suspension 30 mL  30 mL Oral Daily PRN Kerrie Buffalo, NP   30 mL at 09/14/16 0813  . OLANZapine (ZYPREXA) tablet 5 mg  5 mg Oral TID PRN Ursula Alert, MD       Or  . OLANZapine (ZYPREXA) injection 5 mg  5 mg Intramuscular TID PRN Ursula Alert, MD      . ondansetron (ZOFRAN) tablet 4 mg  4 mg Oral Q8H PRN Kerrie Buffalo, NP      . traZODone (DESYREL) tablet 50 mg  50 mg Oral QHS PRN Kerrie Buffalo, NP   50 mg at 09/13/16 2157    PTA Medications: Prescriptions Prior to Admission  Medication Sig Dispense  Refill Last Dose  . OVER THE COUNTER MEDICATION Take 1-2 tablets by mouth daily as needed (allergies, runny nose). OTC allergy medication.   09/03/2016 at Unknown time  . OVER THE COUNTER MEDICATION Place 1 drop into both eyes 4 (four) times daily as needed (dry eyes). OTC eye drops.   Unknown at Unknown time  . [DISCONTINUED] albuterol (PROVENTIL HFA;VENTOLIN HFA) 108 (90 Base) MCG/ACT inhaler Inhale 2 puffs into the lungs every 4 (four) hours as needed for wheezing or shortness of breath. 1 Inhaler 0 09/03/2016 at Unknown time    Treatment Modalities: Medication Management, Group therapy, Case management,  1 to 1 session with clinician, Psychoeducation, Recreational therapy.   Physician Treatment Plan for Primary Diagnosis: Schizophrenia, paranoid ([place]) Long Term Goal(s): Improvement in symptoms so as ready for discharge  Short Term Goals: Ability to identify changes in lifestyle to reduce recurrence of condition will improve Ability to verbalize feelings will improve Ability to disclose and discuss suicidal ideas Ability to demonstrate self-control will improve Ability to identify and develop effective coping behaviors will improve Ability to maintain clinical measurements within normal limits will improve Compliance with prescribed medications will improve Ability to identify triggers associated with substance abuse/mental health issues will improve Ability to identify changes in lifestyle to  reduce recurrence of condition will improve Ability to verbalize feelings will improve Ability to disclose and discuss suicidal ideas Ability to demonstrate self-control will improve Ability to identify and develop effective coping behaviors will improve Ability to maintain clinical measurements within normal limits will improve Compliance with prescribed medications will improve Ability to identify triggers associated with substance abuse/mental health issues will improve  Medication Management:  Evaluate patient's response, side effects, and tolerance of medication regimen.  Therapeutic Interventions: 1 to 1 sessions, Unit Group sessions and Medication administration.  Evaluation of Outcomes: Adequate for Discharge  Physician Treatment Plan for Secondary Diagnosis: Principal Problem:   Schizophrenia, paranoid ([place]) Active Problems:   Cannabis use disorder, severe, dependence ([place])   Long Term Goal(s): Improvement in symptoms so as ready for discharge  Short Term Goals: Ability to identify changes in lifestyle to reduce recurrence of condition will improve Ability to verbalize feelings will improve Ability to disclose and discuss suicidal ideas Ability to demonstrate self-control will improve Ability to identify and develop effective coping behaviors will improve Ability to maintain clinical measurements within normal limits will improve Compliance with prescribed medications will improve Ability to identify triggers associated with substance abuse/mental health issues will improve Ability to identify changes in lifestyle to reduce recurrence of condition will improve Ability to verbalize feelings will improve Ability to disclose and discuss suicidal ideas Ability to demonstrate self-control will improve Ability to identify and develop effective coping behaviors will improve Ability to maintain clinical measurements within normal limits will improve Compliance with prescribed medications will improve Ability to identify triggers associated with substance abuse/mental health issues will improve  Medication Management: Evaluate patient's response, side effects, and tolerance of medication regimen.  Therapeutic Interventions: 1 to 1 sessions, Unit Group sessions and Medication administration.  Evaluation of Outcomes: Adequate for Discharge   RN Treatment Plan for Primary Diagnosis: Schizophrenia, paranoid ([place]) Long Term Goal(s): Knowledge of disease and therapeutic  regimen to maintain health will improve  Short Term Goals: Ability to identify and develop effective coping behaviors will improve and Compliance with prescribed medications will improve  Medication Management: RN will administer medications as ordered by provider, will assess and evaluate patient's response and provide education to patient for prescribed medication. RN will report any adverse and/or side effects to prescribing provider.  Therapeutic Interventions: 1 on 1 counseling sessions, Psychoeducation, Medication administration, Evaluate responses to treatment, Monitor vital signs and CBGs as ordered, Perform/monitor CIWA, COWS, AIMS and Fall Risk screenings as ordered, Perform wound care treatments as ordered.  Evaluation of Outcomes: Adequate for Discharge   LCSW Treatment Plan for Primary Diagnosis: Schizophrenia, paranoid ([place]) Long Term Goal(s): Safe transition to appropriate next level of care at discharge, Engage patient in therapeutic group addressing interpersonal concerns.  Short Term Goals: Engage patient in aftercare planning with referrals and resources  Therapeutic Interventions: Assess for all discharge needs, 1 to 1 time with Social worker, Explore available resources and support systems, Assess for adequacy in community support network, Educate family and significant other(s) on suicide prevention, Complete Psychosocial Assessment, Interpersonal group therapy.  Evaluation of Outcomes: Met  Return home, follow up [facility]   Progress in Treatment: Attending groups: Yes Participating in groups: Yes Taking medication as prescribed: Yes Toleration medication: Yes, no side effects reported at this time Family/Significant other contact made: Yes with father Patient understands diagnosis: No  Limited insight Discussing patient identified problems/goals with staff: Yes Medical problems stabilized or resolved: Yes Denies suicidal/homicidal ideation: Yes Issues/concerns per  patient self-inventory:  None Other: N/A  New problem(s) identified: None identified at this time.   New Short Term/Long Term Goal(s): None identified at this time.   Discharge Plan or Barriers: Pt will return home and follow-up with [facility]  Reason for Continuation of Hospitalization: Delusions  Paranoia Hallucinations Medication stabilization   Estimated Length of Stay: Pt likely d/c today. Attendees: Patient: 09/14/2016  12:04 PM  Physician: Parke Poisson, MD 09/14/2016  12:04 PM  Nursing: Minta Balsam, RN 09/14/2016  12:04 PM  RN Care Manager: Lars Pinks, RN 09/14/2016  12:04 PM  Social Worker: [place], [place] 09/14/2016  12:04 PM  Recreational Therapist:  09/14/2016  12:04 PM  Other: Lindell Spar, NP; Larose Kells, NP 09/14/2016  12:04 PM  Other:  09/14/2016  12:04 PM    Scribe for Treatment Team: Wray Kearns MSW, [facility]  09/14/2016

## 2017-06-02 ENCOUNTER — Encounter (HOSPITAL_COMMUNITY): Payer: Self-pay | Admitting: *Deleted

## 2017-06-02 ENCOUNTER — Other Ambulatory Visit: Payer: Self-pay

## 2017-06-02 ENCOUNTER — Emergency Department (HOSPITAL_COMMUNITY)
Admission: EM | Admit: 2017-06-02 | Discharge: 2017-06-03 | Discharge status: Against Medical Advice | Payer: Self-pay | Attending: Emergency Medicine | Admitting: Emergency Medicine

## 2017-06-02 DIAGNOSIS — F22 Delusional disorders: Secondary | ICD-10-CM | POA: Insufficient documentation

## 2017-06-02 DIAGNOSIS — F17200 Nicotine dependence, unspecified, uncomplicated: Secondary | ICD-10-CM | POA: Insufficient documentation

## 2017-06-02 DIAGNOSIS — J45909 Unspecified asthma, uncomplicated: Secondary | ICD-10-CM | POA: Insufficient documentation

## 2017-06-02 DIAGNOSIS — Z79899 Other long term (current) drug therapy: Secondary | ICD-10-CM | POA: Insufficient documentation

## 2017-06-02 LAB — I-STAT CHEM 8, ED
BUN: 8 mg/dL (ref 6–20)
Calcium, Ion: 1.13 mmol/L — ABNORMAL LOW (ref 1.15–1.40)
Chloride: 103 mmol/L (ref 101–111)
Creatinine, Ser: 0.8 mg/dL (ref 0.61–1.24)
GLUCOSE: 79 mg/dL (ref 65–99)
HEMATOCRIT: 42 % (ref 39.0–52.0)
HEMOGLOBIN: 14.3 g/dL (ref 13.0–17.0)
POTASSIUM: 3.4 mmol/L — AB (ref 3.5–5.1)
SODIUM: 143 mmol/L (ref 135–145)
TCO2: 25 mmol/L (ref 22–32)

## 2017-06-02 LAB — CBC WITH DIFFERENTIAL/PLATELET
BASOS ABS: 0 10*3/uL (ref 0.0–0.1)
BASOS PCT: 0 %
EOS ABS: 0.2 10*3/uL (ref 0.0–0.7)
EOS PCT: 4 %
HCT: 41.4 % (ref 39.0–52.0)
Hemoglobin: 14.1 g/dL (ref 13.0–17.0)
Lymphocytes Relative: 33 %
Lymphs Abs: 1.8 10*3/uL (ref 0.7–4.0)
MCH: 33.3 pg (ref 26.0–34.0)
MCHC: 34.1 g/dL (ref 30.0–36.0)
MCV: 97.6 fL (ref 78.0–100.0)
MONO ABS: 0.4 10*3/uL (ref 0.1–1.0)
Monocytes Relative: 7 %
Neutro Abs: 3 10*3/uL (ref 1.7–7.7)
Neutrophils Relative %: 56 %
PLATELETS: 267 10*3/uL (ref 150–400)
RBC: 4.24 MIL/uL (ref 4.22–5.81)
RDW: 12.3 % (ref 11.5–15.5)
WBC: 5.3 10*3/uL (ref 4.0–10.5)

## 2017-06-02 LAB — ACETAMINOPHEN LEVEL

## 2017-06-02 LAB — COMPREHENSIVE METABOLIC PANEL
ALT: 47 U/L (ref 17–63)
AST: 177 U/L — AB (ref 15–41)
Albumin: 4.2 g/dL (ref 3.5–5.0)
Alkaline Phosphatase: 62 U/L (ref 38–126)
Anion gap: 13 (ref 5–15)
BILIRUBIN TOTAL: 0.8 mg/dL (ref 0.3–1.2)
BUN: 7 mg/dL (ref 6–20)
CO2: 22 mmol/L (ref 22–32)
CREATININE: 0.81 mg/dL (ref 0.61–1.24)
Calcium: 8.9 mg/dL (ref 8.9–10.3)
Chloride: 106 mmol/L (ref 101–111)
Glucose, Bld: 82 mg/dL (ref 65–99)
Potassium: 3.6 mmol/L (ref 3.5–5.1)
Sodium: 141 mmol/L (ref 135–145)
TOTAL PROTEIN: 7 g/dL (ref 6.5–8.1)

## 2017-06-02 LAB — ETHANOL: ALCOHOL ETHYL (B): 49 mg/dL — AB (ref <10)

## 2017-06-02 LAB — SALICYLATE LEVEL

## 2017-06-02 NOTE — ED Triage Notes (Signed)
Pt. Presents with hallucinations of seeing hands grows and states "he can see the veins collapse". Pt. States he has been to behavioral health and was put on medications and they did not help. Pt. Appears very anxious and paranoid. NAD at this time.

## 2017-06-02 NOTE — ED Notes (Addendum)
Pt arrives to nurse first asking if there is a hospital policy regarding patients being contagious.  Questioned pt as to what he was asking and he states he needs to know what the policy is on patients being contagious.  Pt states he needs to speak to someone in charge of the hospital because he has been to 2 hospitals and no one can help him and he is contagious.  Asked pt if he needs to check in to see a doctor and he states he just needs to talk to the person in charge of the hospital about being contagious.  Asked pt what his symptoms are and he states that he doesn't have any symptoms other than being contagious.  Asked pt how he knows he is contagious and he states everyone he is around, he sees them get "it" as well.  He states, "being contagious is my symptom and that is the only symptom I have."  Questioned pt again about possible cough, fever, congestion and he states he has all of those.

## 2017-06-02 NOTE — ED Provider Notes (Signed)
[facility] EMERGENCY DEPARTMENT Provider Note   CSN: [id number] Arrival date & time: 06/02/17  1945     History   Chief Complaint Chief Complaint  Patient presents with  . Hallucinations    HPI Shane Small is a 32 y.o. male with hx of paranoid schizophrenia presents to the ED with multiple complaints. Patient states that he works at [employer] and thinks he is contagious to other people. He states he does not know what he has but his mouth does not look right. He reports that the veins under his tongue are blue and stand out. He states that the veins in his arms are collapsing and his hands are not growing as they should. Patient states he is not taking any medications. He reports that he has been on google and he may have accidentally eaten raw meat and could have e-coli but does not have any symptoms of e-coli. Patient states that he has had problems for over 4 years and been to hospitals on multiple occasions and no one can help him.   Patient without S/I or H/I.  In review of patient's chart he has been evaluated in the past by behavioral health and has had ED visits and his father had been with him on some occasions for similar problems.    The history is provided by the patient. No language interpreter was used.  Mental Health Problem  Presenting symptoms: bizarre behavior   Onset quality:  Unable to specify Associated symptoms: anxiety (patient reports having anxiety and depression)   Associated symptoms: no abdominal pain and no headaches     Past Medical History:  Diagnosis Date  . Asthma     Patient Active Problem List   Diagnosis Date Noted  . Cannabis use disorder, severe, dependence (HCC) 09/10/2016  . Schizophrenia, paranoid (HCC) 09/06/2016    History reviewed. No pertinent surgical history.     Home Medications    Prior to Admission medications   Medication Sig Start Date End Date Taking? Authorizing Provider  albuterol (PROVENTIL  HFA;VENTOLIN HFA) 108 (90 Base) MCG/ACT inhaler Inhale 2 puffs into the lungs every 4 (four) hours as needed for wheezing or shortness of breath. 09/14/16   Armandina Stammer I, NP  ARIPiprazole (ABILIFY) 30 MG tablet Take 1 tablet (30 mg total) by mouth at bedtime. For mood control 09/14/16   Armandina Stammer I, NP  ARIPiprazole ER 400 MG SRER Inject 400 mg into the muscle every 28 (twenty-eight) days. (Due 10-11-16): For mood control 10/11/16   Armandina Stammer I, NP  benztropine (COGENTIN) 0.5 MG tablet Take 1 tablet (0.5 mg total) by mouth 2 (two) times daily. For prevention of drug induced tremors. 09/14/16   Armandina Stammer I, NP  traZODone (DESYREL) 50 MG tablet Take 1 tablet (50 mg total) by mouth at bedtime as needed for sleep. 09/14/16   Sanjuana Kava, NP    Family History No family history on file.  Social History Social History   Tobacco Use  . Smoking status: Current Some Day Smoker  . Smokeless tobacco: Former Engineer, water Use Topics  . Alcohol use: Yes    Comment: occasionally  . Drug use: No     Allergies   Patient has no known allergies.   Review of Systems Review of Systems  Constitutional: Negative for chills and fever.  HENT:       Tongue problem  Gastrointestinal: Negative for abdominal pain, diarrhea, nausea and vomiting.  Genitourinary:  Negative for dysuria.  Musculoskeletal:       "Hands not growing, veins don't look right."  Skin: Negative for rash.  Neurological: Negative for headaches.  Psychiatric/Behavioral: The patient is nervous/anxious (patient reports having anxiety and depression).      Physical Exam Updated Vital Signs BP 131/75 (BP Location: Right Arm)   Pulse 77   Temp 98.2 F (36.8 C) (Oral)   Resp 16   Ht 5\' 11"  (1.803 m)   Wt 70.3 kg (155 lb)   SpO2 99%   BMI 21.62 kg/m   Physical Exam  Constitutional: He is oriented to person, place, and time. He appears well-developed and well-nourished. No distress.  HENT:  Head: Normocephalic and  atraumatic.  Nose: Nose normal.  Mouth/Throat: Uvula is midline, oropharynx is clear and moist and mucous membranes are normal.  Eyes: Conjunctivae and EOM are normal. Pupils are equal, round, and reactive to light.  Neck: Neck supple.  Cardiovascular: Normal rate and regular rhythm.  Pulmonary/Chest: Effort normal and breath sounds normal.  Abdominal: Soft. Bowel sounds are normal. There is no tenderness.  Musculoskeletal: Normal range of motion.  Neurological: He is alert and oriented to person, place, and time. No cranial nerve deficit.  Skin: Skin is warm and dry.  Psychiatric: His speech is normal. His affect is inappropriate. He is withdrawn. Thought content is paranoid. He expresses no homicidal and no suicidal ideation.  Nursing note and vitals reviewed.    ED Treatments / Results  Labs (all labs ordered are listed, but only abnormal results are displayed) Labs Reviewed  COMPREHENSIVE METABOLIC PANEL - Abnormal; Notable for the following components:      Result Value   AST 177 (*)    All other components within normal limits  ETHANOL - Abnormal; Notable for the following components:   Alcohol, Ethyl (B) 49 (*)    All other components within normal limits  ACETAMINOPHEN LEVEL - Abnormal; Notable for the following components:   Acetaminophen (Tylenol), Serum <10 (*)    All other components within normal limits  I-STAT CHEM 8, ED - Abnormal; Notable for the following components:   Potassium 3.4 (*)    Calcium, Ion 1.13 (*)    All other components within normal limits  CBC WITH DIFFERENTIAL/PLATELET  SALICYLATE LEVEL  RAPID URINE DRUG SCREEN, HOSP PERFORMED  URINALYSIS, ROUTINE W REFLEX MICROSCOPIC    Radiology No results found.  Procedures Procedures (including critical care time)  Medications Ordered in ED Medications - No data to display   Initial Impression / Assessment and Plan / ED Course  I have reviewed the triage vital signs and the nursing notes.  TSS  consult called and when RN went to the room to round on the patient the patient was gone. RN called Behavioral Health to tell them the patient had left. Notified Dr. Erma HeritageIsaacs that patient left.   Final Clinical Impressions(s) / ED Diagnoses   Final diagnoses:  Paranoia [facility](HCC)    ED Discharge Orders    None       Kerrie Buffaloeese, Averey Trompeter [place], [place] 06/03/17 [id number]    Shaune PollackIsaacs, [place], MD 06/03/17 [id number]

## 2017-06-02 NOTE — ED Triage Notes (Signed)
The pt is here with multiple symptoms for 2-3 years.  The pt reports that he has had a fever rash painful urination and mouth sores for that length of time.   He reports that he has been to the hospital x 3 behavorial and the cdc x 2 when he talks he does not make direct eye contact

## 2017-06-03 NOTE — ED Notes (Signed)
Pt. Not in room when TTS came by to assess. Charge notified and [place], NP. Will contact behavioral health.

## 2017-06-03 NOTE — BHH Counselor (Signed)
Clinician spoke to [place]anna, [place], will set up tele-assessment cart in about five minutes.    Redmond Pullingreylese D Franciso Dierks, MS, [facility], [facility] Triage Specialist [phone]

## 2017-06-03 NOTE — BHH Counselor (Signed)
Clinician received a call from [place], [place] and noted [facility].  Redmond Pullingreylese D Edwyna Dangerfield, MS, [facility], [facility] Triage Specialist [phone]

## 2017-06-03 NOTE — BHH Counselor (Signed)
Tele-assessment cart was brought to pt's room however the pt's room was empty. Clinician to call the cart back in five minutes.   Redmond Pullingreylese D Bryanne Riquelme, MS, [facility], [facility], [facility] Triage Specialist [phone]

## 2019-06-03 ENCOUNTER — Encounter (HOSPITAL_COMMUNITY): Payer: Self-pay | Admitting: Emergency Medicine

## 2019-06-03 ENCOUNTER — Other Ambulatory Visit: Payer: Self-pay

## 2019-06-03 ENCOUNTER — Ambulatory Visit (HOSPITAL_COMMUNITY)
Admission: EM | Admit: 2019-06-03 | Discharge: 2019-06-03 | Disposition: A | Discharge status: Home / Self Care | Payer: PRIVATE HEALTH INSURANCE

## 2019-06-03 DIAGNOSIS — R110 Nausea: Secondary | ICD-10-CM | POA: Diagnosis not present

## 2019-06-03 DIAGNOSIS — R112 Nausea with vomiting, unspecified: Secondary | ICD-10-CM

## 2019-06-03 MED ORDER — ONDANSETRON 4 MG PO TBDP
8.0000 mg | ORAL_TABLET | Freq: Once | ORAL | Status: AC
  Administered 2019-06-03: 8 mg via ORAL

## 2019-06-03 MED ORDER — ONDANSETRON 8 MG PO TBDP: 8.0000 mg | ORAL_TABLET | Freq: Three times a day (TID) | ORAL | 0 refills | Status: DC | PRN

## 2019-06-03 MED ORDER — ONDANSETRON 4 MG PO TBDP
ORAL_TABLET | ORAL | Status: AC
  Filled 2019-06-03: qty 1

## 2019-06-03 NOTE — ED Triage Notes (Addendum)
Pt reports 2 weeks of vomiting at least 2 times a day.  He states sometimes he will just have dry heaves.  Pt states if he tries to eat it just comes back up.  Pt is able to keep liquids down.  Pt also reports really dark formed stools. He also reports headaches when he eats.

## 2019-06-03 NOTE — ED Provider Notes (Signed)
[facility]   MRN: [id number] DOB: Jun 02, 1985  Subjective:   Shane Small is a 34 y.o. male presenting for several month history of persistent nausea.  Now having vomiting in the last 2 weeks almost twice daily.  Patient also has intermittent headaches when he eats.  He is currently taking Strattera but admits that his nausea started before this.  He is also taking Abilify.  Denies fevers, belly pain, dysuria, hematuria, chest pain, shortness of breath, cough.  Patient used to smoke marijuana heavily but quit smoking 3 to 4 years ago.  Denies heavy alcohol use.  Patient tries to eat a good mix of foods, mostly prepares his own meals.  He drinks about 1 cup of coffee per day and avoid sodas.  He is also had some intermittent dark-colored stools.  Has a bowel movement every other day.  Denies diarrhea, frank blood in the stool, perianal pain, hemorrhoids.  Does not have a PCP.  No current facility-administered medications for this encounter.  Current Outpatient Medications:  .  ARIPiprazole (ABILIFY) 30 MG tablet, Take 1 tablet (30 mg total) by mouth at bedtime. For mood control, Disp: 30 tablet, Rfl: 0 .  atomoxetine (STRATTERA) 10 MG capsule, Take 10 mg by mouth daily., Disp: , Rfl:  .  albuterol (PROVENTIL HFA;VENTOLIN HFA) 108 (90 Base) MCG/ACT inhaler, Inhale 2 puffs into the lungs every 4 (four) hours as needed for wheezing or shortness of breath., Disp: 1 Inhaler, Rfl: 0 .  ARIPiprazole ER 400 MG SRER, Inject 400 mg into the muscle every 28 (twenty-eight) days. (Due 10-11-16): For mood control, Disp: 1 each, Rfl: 0 .  benztropine (COGENTIN) 0.5 MG tablet, Take 1 tablet (0.5 mg total) by mouth 2 (two) times daily. For prevention of drug induced tremors., Disp: 60 tablet, Rfl: 0 .  traZODone (DESYREL) 50 MG tablet, Take 1 tablet (50 mg total) by mouth at bedtime as needed for sleep., Disp: 30 tablet, Rfl: 0   No Known Allergies  Past Medical History:  Diagnosis Date  . Asthma       History reviewed. No pertinent surgical history.  Family History  Problem Relation Age of Onset  . Healthy Mother   . Cancer Father   . Diabetes Father   . Hypertension Father     Social History   Tobacco Use  . Smoking status: Current Some Day Smoker    Types: Cigarettes  . Smokeless tobacco: Former Engineer, water Use Topics  . Alcohol use: Yes    Comment: occasionally  . Drug use: No    ROS   Objective:   Vitals: BP 126/74 (BP Location: Left Arm)   Pulse 64   Temp 98.4 F (36.9 C) (Oral)   Resp 12   SpO2 99%   Physical Exam Constitutional:      General: He is not in acute distress.    Appearance: Normal appearance. He is well-developed. He is not ill-appearing, toxic-appearing or diaphoretic.  HENT:     Head: Normocephalic and atraumatic.     Right Ear: External ear normal.     Left Ear: External ear normal.     Nose: Nose normal.     Mouth/Throat:     Mouth: Mucous membranes are moist.     Pharynx: Oropharynx is clear. No oropharyngeal exudate or posterior oropharyngeal erythema.  Eyes:     General: No scleral icterus.    Extraocular Movements: Extraocular movements intact.     Pupils: Pupils are equal, round,  and reactive to light.  Cardiovascular:     Rate and Rhythm: Normal rate and regular rhythm.     Heart sounds: Normal heart sounds. No murmur. No friction rub. No gallop.   Pulmonary:     Effort: Pulmonary effort is normal. No respiratory distress.     Breath sounds: Normal breath sounds. No stridor. No wheezing, rhonchi or rales.  Abdominal:     General: Bowel sounds are normal. There is no distension.     Palpations: Abdomen is soft. There is no mass.     Tenderness: There is abdominal tenderness (mild over lower abdomen). There is no right CVA tenderness, left CVA tenderness, guarding or rebound.  Skin:    General: Skin is warm and dry.  Neurological:     Mental Status: He is alert and oriented to person, place, and time.    Psychiatric:        Mood and Affect: Mood normal.        Behavior: Behavior normal.        Thought Content: Thought content normal.        Judgment: Judgment normal.     Assessment and Plan :   1. Chronic nausea   2. Nausea and vomiting, intractability of vomiting not specified, unspecified vomiting type     Discussed multiple etiologies but patient needs evaluation for possible GI bleed. Information provided to the patient for GI consult. Will use Zofran for supportive care. Recommended low acidic diet plan. Continue hydrating, one coffee per day is ok. Recommended he revisit using Strattera with his psychiatrist in setting of chronic nausea. Vital signs stable and physical exam findings reassuring for discharge. Counseled patient on potential for adverse effects with medications prescribed/recommended today, ER and return-to-clinic precautions discussed, patient verbalized understanding.    [name], [place] 06/04/19 1242

## 2019-06-12 ENCOUNTER — Ambulatory Visit: Payer: PRIVATE HEALTH INSURANCE | Admitting: Physician Assistant

## 2019-08-11 ENCOUNTER — Ambulatory Visit (INDEPENDENT_AMBULATORY_CARE_PROVIDER_SITE_OTHER): Payer: PRIVATE HEALTH INSURANCE | Admitting: Physician Assistant

## 2019-08-11 ENCOUNTER — Encounter: Payer: Self-pay | Admitting: Physician Assistant

## 2019-08-11 VITALS — BP 118/80 | HR 80 | Temp 98.1°F | Ht 70.75 in | Wt 176.2 lb

## 2019-08-11 DIAGNOSIS — R0989 Other specified symptoms and signs involving the circulatory and respiratory systems: Secondary | ICD-10-CM

## 2019-08-11 DIAGNOSIS — R112 Nausea with vomiting, unspecified: Secondary | ICD-10-CM | POA: Diagnosis not present

## 2019-08-11 DIAGNOSIS — Z800 Family history of malignant neoplasm of digestive organs: Secondary | ICD-10-CM | POA: Diagnosis not present

## 2019-08-11 DIAGNOSIS — R1013 Epigastric pain: Secondary | ICD-10-CM | POA: Diagnosis not present

## 2019-08-11 DIAGNOSIS — Z01818 Encounter for other preprocedural examination: Secondary | ICD-10-CM

## 2019-08-11 MED ORDER — OMEPRAZOLE 20 MG PO CPDR: 20.0000 mg | DELAYED_RELEASE_CAPSULE | Freq: Every day | ORAL | 3 refills | Status: DC

## 2019-08-11 NOTE — Progress Notes (Signed)
Chief Complaint: Vomiting, globus sensation, epigastric pain  HPI:    Mr. Shane Small is a 34 year old African-American male with past medical history as listed below including schizophrenia and asthma, who presents to clinic today for follow-up after being seen in the ER for nausea and vomiting with epigastric pain and a globus sensation.    06/03/2019 patient seen in the ED for nausea and vomiting for 2 weeks.  Labs showed a normal CBC and CMP.  Also intermittent headaches.  Described previously smoking marijuana heavily but quit smoking 3 to 4 years ago.  Also describes intermittent dark-colored stools.  He was put on a low acidic diet plan.  Also discussed that Strattera can cause nausea and recommended he discuss this with his psychiatrist.  He was sent to our clinic.    Today, the patient presents to clinic and tells me that he has had nausea and vomiting as well as epigastric pain for some years off-and-on.  Initially thought this was because of some raw beef/bad chicken he had eaten but it continued.  Tells me that it will happen at least once a day when he is "eating fast or when I am not eating at all".  Tells me that he will gag and cough and feel like there is constantly something in his throat.  Also believes that there is a discoloration of his gums but saw the dentist earlier this year who told him there was nothing abnormal.  Tells me he tried to take Pepto but this turned his stool black as an ER notes above.  He has not tried any other medication for this.  Associated symptoms include some reflux and an epigastric cramping pain which is a 6-7/10.  Tells me all he does is try to stretch which does not really help.    Social history positive for doing Taekwondo.  Vague family history of colon cancer in his father?  Possibly diagnosed at the age of 93.  Distant history of using marijuana 3 to 4 years ago.    Denies fever, chills, weight loss or symptoms that awaken him from sleep.   Past  Medical History:  Diagnosis Date  . Asthma   . Schizophrenia [facility])     Past Surgical History:  Procedure Laterality Date  . NO PAST SURGERIES      Current Outpatient Medications  Medication Sig Dispense Refill  . ARIPiprazole (ABILIFY) 30 MG tablet Take 1 tablet (30 mg total) by mouth at bedtime. For mood control 30 tablet 0  . ARIPiprazole ER 400 MG SRER Inject 400 mg into the muscle every 28 (twenty-eight) days. (Due 10-11-16): For mood control (Patient not taking: Reported on 08/11/2019) 1 each 0  . atomoxetine (STRATTERA) 10 MG capsule Take 10 mg by mouth daily.    . benztropine (COGENTIN) 0.5 MG tablet Take 1 tablet (0.5 mg total) by mouth 2 (two) times daily. For prevention of drug induced tremors. (Patient not taking: Reported on 08/11/2019) 60 tablet 0  . traZODone (DESYREL) 50 MG tablet Take 1 tablet (50 mg total) by mouth at bedtime as needed for sleep. (Patient not taking: Reported on 08/11/2019) 30 tablet 0   No current facility-administered medications for this visit.    Allergies as of 08/11/2019  . (No Known Allergies)    Family History  Problem Relation Age of Onset  . Asthma Mother   . Diabetes Father   . Hypertension Father   . Colon cancer Father 83    Social History  Socioeconomic History  . Marital status: Single    Spouse name: Not on file  . Number of children: Not on file  . Years of education: Not on file  . Highest education level: Not on file  Occupational History  . Not on file  Tobacco Use  . Smoking status: Current Some Day Smoker    Types: Cigarettes  . Smokeless tobacco: Never Used  Substance and Sexual Activity  . Alcohol use: Yes    Comment: 2-3 per day  . Drug use: No  . Sexual activity: Not on file  Other Topics Concern  . Not on file  Social History Narrative  . Not on file   Social Determinants of Health   Financial Resource Strain:   . Difficulty of Paying Living Expenses:   Food Insecurity:   . Worried About Paediatric nurse in the Last Year:   . Arboriculturist in the Last Year:   Transportation Needs:   . Film/video editor (Medical):   Marland Kitchen Lack of Transportation (Non-Medical):   Physical Activity:   . Days of Exercise per Week:   . Minutes of Exercise per Session:   Stress:   . Feeling of Stress :   Social Connections:   . Frequency of Communication with Friends and Family:   . Frequency of Social Gatherings with Friends and Family:   . Attends Religious Services:   . Active Member of Clubs or Organizations:   . Attends Archivist Meetings:   Marland Kitchen Marital Status:   Intimate Partner Violence:   . Fear of Current or Ex-Partner:   . Emotionally Abused:   Marland Kitchen Physically Abused:   . Sexually Abused:     Review of Systems:    Constitutional: No weight loss, fever or chills Skin: No rash  Cardiovascular: No chest pain  Respiratory: No SOB  Gastrointestinal: See HPI and otherwise negative Genitourinary: No dysuria  Neurological: No headache Musculoskeletal: No new muscle or joint pain Hematologic: No bruising Psychiatric: No history of depression or anxiety   Physical Exam:  Vital signs: BP 118/80 (BP Location: Left Arm, Patient Position: Sitting, Cuff Size: Normal)   Pulse 80   Temp 98.1 F (36.7 C)   Ht 5' 10.75" (1.797 m) Comment: height measured without shoes  Wt 176 lb 4 oz (79.9 kg)   BMI 24.76 kg/m   Constitutional:   Pleasant AA male appears to be in NAD, Well developed, Well nourished, alert and cooperative Head:  Normocephalic and atraumatic. Eyes:   PEERL, EOMI. No icterus. Conjunctiva pink. Ears:  Normal auditory acuity. Neck:  Supple Throat: Oral cavity and pharynx without inflammation, swelling or lesion.  Respiratory: Respirations even and unlabored. Lungs clear to auscultation bilaterally.   No wheezes, crackles, or rhonchi.  Cardiovascular: Normal S1, S2. No MRG. Regular rate and rhythm. No peripheral edema, cyanosis or pallor.  Gastrointestinal:  Soft,  nondistended, mild epigastric ttp. No rebound or guarding. Normal bowel sounds. No appreciable masses or hepatomegaly. Rectal:  Not performed.  Msk:  Symmetrical without gross deformities. Without edema, no deformity or joint abnormality.  Neurologic:  Alert and  oriented x4;  grossly normal neurologically.  Skin:   Dry and intact without significant lesions or rashes. Psychiatric: Demonstrates good judgement and reason without abnormal affect or behaviors.  See HPI for recent labs.  Assessment: 1.  Nausea and vomiting: Intermittent over the past many years; consider gastritis versus H. pylori versus vomiting versus other 2.  Epigastric  pain: Described as a cramping; consider gastritis +/- H. pylori 3.  Globus sensation: Constantly feels like there is something in the back of his throat ; consider esophagitis versus other  4.  Family history of colon cancer: In his father at the age of 31?  Plan: 1.  Scheduled patient for an EGD in the [facility] with Dr. Rhea Belton.  Did discuss risks, benefits, limitations and alternatives and the patient agrees to proceed.  Patient will be Covid tested 2 days prior to time of her procedure. 2.  Prescribed Omeprazole 20 mg every morning, 30-60 minutes before breakfast #30 with 5 refills. 3.  Patient to follow in clinic per recommendations from Dr. Rhea Belton after time pf procedure.  Hyacinth Meeker, PA-C [facility] 08/11/2019, 9:39 AM

## 2019-08-11 NOTE — Patient Instructions (Addendum)
If you are age 33 or older, your body mass index should be between 23-30. Your Body mass index is 24.76 kg/m. If this is out of the aforementioned range listed, please consider follow up with your Primary Care Provider.  If you are age 41 or younger, your body mass index should be between 19-25. Your Body mass index is 24.76 kg/m. If this is out of the aformentioned range listed, please consider follow up with your Primary Care Provider.   We have sent the following medications to your pharmacy for you to pick up at your convenience: Omeprazole 20 mg daily   Due to recent changes in healthcare laws, you may see the results of your imaging and laboratory studies on MyChart before your provider has had a chance to review them.  We understand that in some cases there may be results that are confusing or concerning to you. Not all laboratory results come back in the same time frame and the provider may be waiting for multiple results in order to interpret others.  Please give [place] 48 hours in order for your provider to thoroughly review all the results before contacting the office for clarification of your results.   Thank you for choosing me and [facility] Hyacinth Meeker, PA-C

## 2019-08-13 ENCOUNTER — Ambulatory Visit (INDEPENDENT_AMBULATORY_CARE_PROVIDER_SITE_OTHER): Payer: PRIVATE HEALTH INSURANCE

## 2019-08-13 DIAGNOSIS — Z1159 Encounter for screening for other viral diseases: Secondary | ICD-10-CM

## 2019-08-13 LAB — SARS CORONAVIRUS 2 (TAT 6-24 HRS): SARS Coronavirus 2: NEGATIVE

## 2019-08-17 ENCOUNTER — Other Ambulatory Visit: Payer: Self-pay

## 2019-08-17 ENCOUNTER — Ambulatory Visit: Payer: PRIVATE HEALTH INSURANCE | Admitting: Internal Medicine

## 2019-08-17 NOTE — Progress Notes (Signed)
During admission process pt. Informed staff that he had 1/2 of bottle of apple juice on the way here @ 3:20 p.m.,made Dr. Rhea Belton aware and pt. Is to reschedule procedure.pt. rescheduled for 08/18/19 @ 4.p.m instructions entered and reviewed with pt. Who verbalized understanding.

## 2019-08-17 NOTE — Progress Notes (Signed)
Addendum: Reviewed and agree with assessment and management plan. Given family history of colon cancer I would recommend screening colonoscopy at age 34. Saulo Anthis, Carie Caddy, MD

## 2019-08-18 ENCOUNTER — Encounter: Payer: Self-pay | Admitting: Internal Medicine

## 2019-08-18 ENCOUNTER — Ambulatory Visit (AMBULATORY_SURGERY_CENTER): Payer: PRIVATE HEALTH INSURANCE | Admitting: Internal Medicine

## 2019-08-18 VITALS — BP 100/67 | HR 79 | Temp 97.8°F | Resp 12 | Ht 70.0 in | Wt 176.0 lb

## 2019-08-18 DIAGNOSIS — K2950 Unspecified chronic gastritis without bleeding: Secondary | ICD-10-CM

## 2019-08-18 DIAGNOSIS — K3189 Other diseases of stomach and duodenum: Secondary | ICD-10-CM

## 2019-08-18 DIAGNOSIS — R112 Nausea with vomiting, unspecified: Secondary | ICD-10-CM

## 2019-08-18 MED ORDER — SODIUM CHLORIDE 0.9 % IV SOLN: 500.0000 mL | Freq: Once | INTRAVENOUS | Status: DC

## 2019-08-18 MED ORDER — ONDANSETRON HCL 4 MG PO TABS: ORAL_TABLET | ORAL | 1 refills | Status: DC

## 2019-08-18 NOTE — Progress Notes (Signed)
Called to room to assist during endoscopic procedure.  Patient ID and intended procedure confirmed with present staff. Received instructions for my participation in the procedure from the performing physician.  

## 2019-08-18 NOTE — Progress Notes (Signed)
Report given to PACU, vss 

## 2019-08-18 NOTE — Op Note (Signed)
[facility] Patient Name: Shane Small Procedure Date: 08/18/2019 4:19 PM MRN: [id number] Endoscopist: Beverley Fiedler , MD Age: 34 Referring MD:  Date of Birth: May 03, 1985 Gender: Male Account #: [id number] Procedure:                Upper GI endoscopy Indications:              Epigastric abdominal pain, Nausea with vomiting Medicines:                Monitored Anesthesia Care Procedure:                Pre-Anesthesia Assessment:                           - Prior to the procedure, a History and Physical                            was performed, and patient medications and                            allergies were reviewed. The patient's tolerance of                            previous anesthesia was also reviewed. The risks                            and benefits of the procedure and the sedation                            options and risks were discussed with the patient.                            All questions were answered, and informed consent                            was obtained. Prior Anticoagulants: The patient has                            taken no previous anticoagulant or antiplatelet                            agents. ASA Grade Assessment: II - A patient with                            mild systemic disease. After reviewing the risks                            and benefits, the patient was deemed in                            satisfactory condition to undergo the procedure.                           After obtaining informed consent, the endoscope was  passed under direct vision. Throughout the                            procedure, the patient's blood pressure, pulse, and                            oxygen saturations were monitored continuously. The                            Endoscope was introduced through the mouth, and                            advanced to the second part of duodenum. The upper                            GI  endoscopy was accomplished without difficulty.                            The patient tolerated the procedure well. Scope In: Scope Out: Findings:                 The examined esophagus was normal.                           The entire examined stomach was normal. Biopsies                            were taken with a cold forceps for histology and                            Helicobacter pylori testing.                           The cardia and gastric fundus were normal on                            retroflexion.                           The examined duodenum was normal. Biopsies were                            taken with a cold forceps for histology. Complications:            No immediate complications. Estimated Blood Loss:     Estimated blood loss was minimal. Impression:               - Normal esophagus.                           - Normal stomach. Biopsied.                           - Normal examined duodenum. Biopsied. Recommendation:           - Patient has a contact number available for  emergencies. The signs and symptoms of potential                            delayed complications were discussed with the                            patient. Return to normal activities tomorrow.                            Written discharge instructions were provided to the                            patient.                           - Resume previous diet.                           - Continue present medications.                           - Can try ondansetron ODT 4 mg three times daily as                            needed for nausea/vomiting. This pill will dissolve                            in your mouth to ease administration.                           - Await pathology results. Jerene Bears, MD 08/18/2019 4:39:24 PM This report has been signed electronically.

## 2019-08-18 NOTE — Progress Notes (Signed)
CW- vitals LC- temp 

## 2019-08-18 NOTE — Patient Instructions (Signed)
Discharge instructions given. Biopsies taken. Prescription sent to pharmacy. Resume previous medications. YOU HAD AN ENDOSCOPIC PROCEDURE TODAY AT THE [facility]:   Refer to the procedure report that was given to you for any specific questions about what was found during the examination.  If the procedure report does not answer your questions, please call your gastroenterologist to clarify.  If you requested that your care partner not be given the details of your procedure findings, then the procedure report has been included in a sealed envelope for you to review at your convenience later.  YOU SHOULD EXPECT: Some feelings of bloating in the abdomen. Passage of more gas than usual.  Walking can help get rid of the air that was put into your GI tract during the procedure and reduce the bloating. If you had a lower endoscopy (such as a colonoscopy or flexible sigmoidoscopy) you may notice spotting of blood in your stool or on the toilet paper. If you underwent a bowel prep for your procedure, you may not have a normal bowel movement for a few days.  Please Note:  You might notice some irritation and congestion in your nose or some drainage.  This is from the oxygen used during your procedure.  There is no need for concern and it should clear up in a day or so.  SYMPTOMS TO REPORT IMMEDIATELY:    Following upper endoscopy (EGD)  Vomiting of blood or coffee ground material  New chest pain or pain under the shoulder blades  Painful or persistently difficult swallowing  New shortness of breath  Fever of 100F or higher  Black, tarry-looking stools  For urgent or emergent issues, a gastroenterologist can be reached at any hour by calling [phone]. Do not use MyChart messaging for urgent concerns.    DIET:  We do recommend a small meal at first, but then you may proceed to your regular diet.  Drink plenty of fluids but you should avoid alcoholic beverages for 24  hours.  ACTIVITY:  You should plan to take it easy for the rest of today and you should NOT DRIVE or use heavy machinery until tomorrow (because of the sedation medicines used during the test).    FOLLOW UP: Our staff will call the number listed on your records 48-72 hours following your procedure to check on you and address any questions or concerns that you may have regarding the information given to you following your procedure. If we do not reach you, we will leave a message.  We will attempt to reach you two times.  During this call, we will ask if you have developed any symptoms of COVID 19. If you develop any symptoms (ie: fever, flu-like symptoms, shortness of breath, cough etc.) before then, please call [phone].  If you test positive for Covid 19 in the 2 weeks post procedure, please call and report this information to us.    If any biopsies were taken you will be contacted by phone or by letter within the next 1-3 weeks.  Please call us at [phone] if you have not heard about the biopsies in 3 weeks.    SIGNATURES/CONFIDENTIALITY: You and/or your care partner have signed paperwork which will be entered into your electronic medical record.  These signatures attest to the fact that that the information above on your After Visit Summary has been reviewed and is understood.  Full responsibility of the confidentiality of this discharge information lies with you and/or your care-partner.  

## 2019-08-20 ENCOUNTER — Telehealth: Payer: Self-pay | Admitting: *Deleted

## 2019-08-20 NOTE — Telephone Encounter (Signed)
  Follow up Call-  Call back number 08/18/2019 08/17/2019  Post procedure Call Back phone  # [phone]  Permission to leave phone message Yes Yes  Some recent data might be hidden     Patient questions:  Do you have a fever, pain , or abdominal swelling? No. Pain Score  0 *  Have you tolerated food without any problems? Yes.    Have you been able to return to your normal activities? Yes.    Do you have any questions about your discharge instructions: Diet   No. Medications  No. Follow up visit  No.  Do you have questions or concerns about your Care? No.  Actions: * If pain score is 4 or above: No action needed, pain <4.  1. Have you developed a fever since your procedure? no  2.   Have you had an respiratory symptoms (SOB or cough) since your procedure? no  3.   Have you tested positive for COVID 19 since your procedure no  4.   Have you had any family members/close contacts diagnosed with the COVID 19 since your procedure?  no   If yes to any of these questions please route to Laverna Peace, RN and Charlett Lango, RN

## 2019-08-24 ENCOUNTER — Encounter: Payer: Self-pay | Admitting: Internal Medicine

## 2019-08-28 ENCOUNTER — Telehealth: Payer: Self-pay | Admitting: Internal Medicine

## 2019-08-28 NOTE — Telephone Encounter (Signed)
Patient requesting path results.

## 2019-08-31 NOTE — Telephone Encounter (Signed)
Spoke with pt and he is aware. 

## 2019-08-31 NOTE — Telephone Encounter (Signed)
Dear Shane Small,  I am writing to inform you that the biopsies taken during your recent endoscopic examination showed mild chronic inflammation in your stomach, but no evidence of abnormal cells or infection.  Your small bowel biopsies were normal.  Please let me know if you continue to struggle with nausea, vomiting or abdominal pain.    Please call us at Dept: [phone] if you have persistent problems or have questions about your condition that have not been fully answered at this time.  Sincerely,  Beverley Fiedler, MD  Left message for pt to call back.

## 2019-09-19 ENCOUNTER — Ambulatory Visit (HOSPITAL_COMMUNITY)
Admission: EM | Admit: 2019-09-19 | Discharge: 2019-09-19 | Disposition: A | Discharge status: Home / Self Care | Payer: PRIVATE HEALTH INSURANCE | Attending: Physician Assistant | Admitting: Physician Assistant

## 2019-09-19 ENCOUNTER — Other Ambulatory Visit: Payer: Self-pay

## 2019-09-19 ENCOUNTER — Encounter (HOSPITAL_COMMUNITY): Payer: Self-pay | Admitting: Gynecology

## 2019-09-19 DIAGNOSIS — R110 Nausea: Secondary | ICD-10-CM

## 2019-09-19 MED ORDER — OMEPRAZOLE 20 MG PO CPDR: 20.0000 mg | DELAYED_RELEASE_CAPSULE | Freq: Two times a day (BID) | ORAL | 3 refills | Status: DC

## 2019-09-19 NOTE — ED Provider Notes (Signed)
[place]    CSN: [id number] Arrival date & time: 09/19/19  1619      History   Chief Complaint Chief Complaint  Patient presents with  . GI Problem    HPI Shane Small is a 34 y.o. male.   Patient with history of paranoid schizophrenia and chronic GI issues present for evaluation of his chronic nausea and vomiting.  He reports he has had work-up recently with GI for the same.  He reports he has been having issues with nausea for 2 years.  He reports he will have 1-2 episodes of vomiting most days.  He reports he will have epigastric pain after meals.  He reports no acute changes in his symptoms.  Denies fevers and chills.  He reports he also has headaches frequently but these have not changed either.  No diarrhea reported.   He is wondering if there is anything we can do to test him that the GI doctors have not done.  He also is wondering if there is any other treatments.  He also wonders if this could have been related to sexual encounter several years ago.  He also wonders if this could be infectious for the last 2 years as he has noticed several people around him get similar symptoms over the years.  Patient reports he takes his Prilosec daily.  He reports he uses Zofran but does not feel it helps much.  He reports he is compliant with his other medications.  He is asking for the [facility] health mental health outpatient number as he has been transferred from [facility] to [facility].  He endorses alcohol use and marijuana use.  He believes these help his nausea.     Past Medical History:  Diagnosis Date  . Asthma   . Schizophrenia ([place])   . Stroke [facility])     Patient Active Problem List   Diagnosis Date Noted  . Cannabis use disorder, severe, dependence ([place]) 09/10/2016  . Schizophrenia, paranoid ([place]) 09/06/2016    Past Surgical History:  Procedure Laterality Date  . NO PAST SURGERIES         Home Medications    Prior to Admission medications   Medication  Sig Start Date End Date Taking? Authorizing Provider  ARIPiprazole (ABILIFY) 30 MG tablet Take 1 tablet (30 mg total) by mouth at bedtime. For mood control 09/14/16  Yes Lindell Spar I, NP  atomoxetine (STRATTERA) 10 MG capsule Take 10 mg by mouth daily.   Yes [provider]  ondansetron (ZOFRAN) 4 MG tablet ZOFRAN 4MG  three times a day as needed for nausea and vomiting. This will resolve in your mouth to ease administration. 08/18/19  Yes Pyrtle, Lajuan Lines, MD  ARIPiprazole ER 400 MG SRER Inject 400 mg into the muscle every 28 (twenty-eight) days. (Due 10-11-16): For mood control Patient not taking: Reported on 08/11/2019 10/11/16   Lindell Spar I, NP  benztropine (COGENTIN) 0.5 MG tablet Take 1 tablet (0.5 mg total) by mouth 2 (two) times daily. For prevention of drug induced tremors. Patient not taking: Reported on 08/11/2019 09/14/16   Lindell Spar I, NP  omeprazole (PRILOSEC) 20 MG capsule Take 1 capsule (20 mg total) by mouth 2 (two) times daily before a meal. 09/19/19   Carmin Alvidrez, Marguerita Beards, PA-C  traZODone (DESYREL) 50 MG tablet Take 1 tablet (50 mg total) by mouth at bedtime as needed for sleep. Patient not taking: Reported on 08/11/2019 09/14/16   Encarnacion Slates, NP  Family History Family History  Problem Relation Age of Onset  . Asthma Mother   . Diabetes Father   . Hypertension Father   . Colon cancer Father 53    Social History Social History   Tobacco Use  . Smoking status: Current Some Day Smoker    Types: Cigarettes  . Smokeless tobacco: Never Used  Substance Use Topics  . Alcohol use: Yes    Comment: 2-3 per day  . Drug use: No     Allergies   Patient has no known allergies.   Review of Systems Review of Systems   Physical Exam Triage Vital Signs ED Triage Vitals  Enc Vitals Group     BP 09/19/19 1708 129/74     Pulse Rate 09/19/19 1708 79     Resp 09/19/19 1708 18     Temp 09/19/19 1708 99.1 F (37.3 C)     Temp Source 09/19/19 1708 Oral     SpO2  09/19/19 1708 96 %     Weight 09/19/19 1707 175 lb (79.4 kg)     Height 09/19/19 1707 5\' 11"  (1.803 m)     Head Circumference --      Peak Flow --      Pain Score 09/19/19 1706 0     Pain Loc --      Pain Edu? --      Excl. in GC? --    No data found.  Updated Vital Signs BP 129/74 (BP Location: Left Arm)   Pulse 79   Temp 99.1 F (37.3 C) (Oral)   Resp 18   Ht 5\' 11"  (1.803 m)   Wt 175 lb (79.4 kg)   SpO2 96%   BMI 24.41 kg/m   Visual Acuity Right Eye Distance:   Left Eye Distance:   Bilateral Distance:    Right Eye Near:   Left Eye Near:    Bilateral Near:     Physical Exam Vitals and nursing note reviewed.  Constitutional:      Appearance: He is well-developed.  HENT:     Head: Normocephalic and atraumatic.  Eyes:     Conjunctiva/sclera: Conjunctivae normal.  Cardiovascular:     Rate and Rhythm: Normal rate and regular rhythm.     Heart sounds: No murmur.  Pulmonary:     Effort: Pulmonary effort is normal. No respiratory distress.     Breath sounds: Normal breath sounds.  Abdominal:     Palpations: Abdomen is soft.     Tenderness: There is no abdominal tenderness.  Musculoskeletal:     Cervical back: Neck supple.  Skin:    General: Skin is warm and dry.  Neurological:     Mental Status: He is alert.      UC Treatments / Results  Labs (all labs ordered are listed, but only abnormal results are displayed) Labs Reviewed - No data to display  EKG   Radiology No results found.  Procedures Procedures (including critical care time)  Medications Ordered in UC Medications - No data to display  Initial Impression / Assessment and Plan / UC Course  I have reviewed the triage vital signs and the nursing notes.  Pertinent labs & imaging results that were available during my care of the patient were reviewed by me and considered in my medical decision making (see chart for details).     #Chronic nausea Patient is a 34 year old with history of  paranoid schizophrenia and chronic nausea and vomiting presenting for chronic nausea and  vomiting symptoms.  There have been no major changes in his symptoms.  He has normal vital signs and a benign exam today, we discussed that there is nothing that we could add in urgent care to the previous work-up by GI.  Per chart review patient is seen about her GI in April with endoscopy on 08/18/2019, found to have mild inflammation.  Pathology was negative.  Patient has not followed up with GI yet.  We discussed that we could increase his dose of Prilosec to twice a day and I recommended that he stop alcohol and marijuana use.  Instructed him to follow-up with his GI providers.  Return precautions for acutely changing symptoms.  [place] outpatient mental health resource number given.  Patient verbalizes understanding of plan and agreement. Final Clinical Impressions(s) / UC Diagnoses   Final diagnoses:  Chronic nausea     Discharge Instructions     Start taking the prilosec 2 times a day Continue other treatments from your GI doctors  Stop alcohol and cannabis use, as this can contribute to your nausea.  If acutely worsening, please return or go to the ED    ED Prescriptions    Medication Sig Dispense Auth. Provider   omeprazole (PRILOSEC) 20 MG capsule Take 1 capsule (20 mg total) by mouth 2 (two) times daily before a meal. 30 capsule Ovella Manygoats, Veryl Speak, PA-C     PDMP not reviewed this encounter.   Hermelinda Medicus, PA-C 09/19/19 1807

## 2019-09-19 NOTE — ED Triage Notes (Signed)
Per patient with nausea/ vomiting and headache going on x couple years. Patient stated was seen 2-3 weeks ago by a GI doctor and diagnose with mild inflammation in his stomach.

## 2019-09-19 NOTE — Discharge Instructions (Signed)
Start taking the prilosec 2 times a day Continue other treatments from your GI doctors  Stop alcohol and cannabis use, as this can contribute to your nausea.  If acutely worsening, please return or go to the ED

## 2019-09-30 ENCOUNTER — Ambulatory Visit (HOSPITAL_COMMUNITY): Payer: Self-pay | Admitting: Clinical

## 2019-10-31 ENCOUNTER — Ambulatory Visit (HOSPITAL_COMMUNITY)
Admission: EM | Admit: 2019-10-31 | Discharge: 2019-10-31 | Disposition: A | Discharge status: Home / Self Care | Payer: PRIVATE HEALTH INSURANCE | Attending: Family Medicine | Admitting: Family Medicine

## 2019-10-31 ENCOUNTER — Other Ambulatory Visit: Payer: Self-pay

## 2019-10-31 ENCOUNTER — Encounter (HOSPITAL_COMMUNITY): Payer: Self-pay

## 2019-10-31 DIAGNOSIS — G43819 Other migraine, intractable, without status migrainosus: Secondary | ICD-10-CM

## 2019-10-31 MED ORDER — METOCLOPRAMIDE HCL 5 MG/ML IJ SOLN
5.0000 mg | Freq: Once | INTRAMUSCULAR | Status: AC
Start: 2019-10-31 — End: 2019-10-31
  Administered 2019-10-31: 5 mg via INTRAMUSCULAR

## 2019-10-31 MED ORDER — ONDANSETRON 4 MG PO TBDP
4.0000 mg | ORAL_TABLET | Freq: Once | ORAL | Status: AC
  Administered 2019-10-31: 4 mg via ORAL

## 2019-10-31 MED ORDER — DEXAMETHASONE SODIUM PHOSPHATE 10 MG/ML IJ SOLN
10.0000 mg | Freq: Once | INTRAMUSCULAR | Status: AC
  Administered 2019-10-31: 10 mg via INTRAMUSCULAR

## 2019-10-31 MED ORDER — ONDANSETRON 4 MG PO TBDP
ORAL_TABLET | ORAL | Status: AC
  Filled 2019-10-31: qty 1

## 2019-10-31 MED ORDER — KETOROLAC TROMETHAMINE 30 MG/ML IJ SOLN
INTRAMUSCULAR | Status: AC
  Filled 2019-10-31: qty 1

## 2019-10-31 MED ORDER — METOCLOPRAMIDE HCL 5 MG/ML IJ SOLN
INTRAMUSCULAR | Status: AC
  Filled 2019-10-31: qty 2

## 2019-10-31 MED ORDER — DEXAMETHASONE SODIUM PHOSPHATE 10 MG/ML IJ SOLN
INTRAMUSCULAR | Status: AC
  Filled 2019-10-31: qty 1

## 2019-10-31 MED ORDER — KETOROLAC TROMETHAMINE 30 MG/ML IJ SOLN
30.0000 mg | Freq: Once | INTRAMUSCULAR | Status: AC
  Administered 2019-10-31: 30 mg via INTRAMUSCULAR

## 2019-10-31 NOTE — ED Triage Notes (Signed)
Pt c/o migrainex1 mo. Pt c/o nausea. Pt denies vomiting. Pt c/o light sensitivity, blurred vision at times.

## 2019-10-31 NOTE — Discharge Instructions (Addendum)
Meds ordered this encounter  ?Medications  ? ketorolac (TORADOL) 30 MG/ML injection 30 mg  ? metoCLOPramide (REGLAN) injection 5 mg  ? dexamethasone (DECADRON) injection 10 mg  ? ondansetron (ZOFRAN-ODT) disintegrating tablet 4 mg  ? ? ?

## 2019-11-02 NOTE — ED Provider Notes (Signed)
[facility]   [id number] 10/31/19 Arrival Time: 1550  ASSESSMENT & PLAN:  1. Other migraine without status migrainosus, intractable      Meds ordered this encounter  Medications  . ketorolac (TORADOL) 30 MG/ML injection 30 mg  . metoCLOPramide (REGLAN) injection 5 mg  . dexamethasone (DECADRON) injection 10 mg  . ondansetron (ZOFRAN-ODT) disintegrating tablet 4 mg    Normal neurological exam. Afebrile without nuchal rigidity. Discussed. Current presentation and symptoms are consistent with prior migraines and are not consistent with SAH, ICH, meningitis, or temporal arteritis. No indication for neurodiagnostic workup at this time. Discussed.  Recommend:  Follow-up Information    [facility] EMERGENCY DEPARTMENT.   Specialty: Emergency Medicine Why: If symptoms worsen in any way. Contact information: [address] [id number] mc [place] [address] [phone]       Schedule an appointment as soon as possible for a visit  with  [facility].   Contact information: [address] [place] [address] [phone]               Reviewed expectations re: course of current medical issues. Questions answered. Outlined signs and symptoms indicating need for more acute intervention. Patient verbalized understanding. After Visit Summary given.   SUBJECTIVE: History from: Pt Patient is able to give a clear and coherent history.  Shane Small is a 34 y.o. male who presents with complaint of a migraine headache. Onset gradual, on/off over past month. Location: frontal without radiation. History of headaches: yes. Precipitating factors include: none which have been determined. Not worst HA of life. Associated symptoms: Preceding aura: no. Nausea/vomiting: n without v. Vision changes: no. Increased sensitivity to light and to noises: yes. Fever: no. Sinus pressure/congestion:  no. Extremity weakness: no. Home treatment has included resting and sleeping with little improvement. Current headache has not limited normal daily activities. Denies loss of balance, muscle weakness, numbness of extremities, speech difficulties and vision problems. No head injury reported. Ambulatory without difficulty. No recent travel.      OBJECTIVE:  Vitals:   10/31/19 1620 10/31/19 1621  BP: 126/89   Pulse: 64   Resp: 16   Temp: 98.2 F (36.8 C)   TempSrc: Oral   SpO2: 100%   Weight:  78.5 kg  Height:  5\' 11"  (1.803 m)    General appearance: alert; NAD HENT: normocephalic; atraumatic Eyes: PERRLA; EOMI; conjunctivae normal Neck: supple with FROM Lungs: clear to auscultation bilaterally; unlabored respirations Heart: regular Extremities: no edema; symmetrical with no gross deformities Skin: warm and dry Neurologic: alert; speech is fluent and clear without dysarthria or aphasia; CN 2-12 grossly intact Psychological: alert and cooperative; normal mood and affect   No Known Allergies  Past Medical History:  Diagnosis Date  . Asthma   . Schizophrenia ([facility])   . Stroke [facility])    Social History   Socioeconomic History  . Marital status: Single    Spouse name: Not on file  . Number of children: 1  . Years of education: Not on file  . Highest education level: Not on file  Occupational History  . Occupation: [employer]  Tobacco Use  . Smoking status: Current Some Day Smoker    Types: Cigarettes  . Smokeless tobacco: Never Used  Vaping Use  . Vaping Use: Every day  Substance and Sexual Activity  . Alcohol use: Yes    Alcohol/week: 56.0 standard drinks    Types: 28 Cans of beer, 28  Shots of liquor per week  . Drug use: No  . Sexual activity: Not on file  Other Topics Concern  . Not on file  Social History Narrative  . Not on file   Social Determinants of Health   Financial Resource Strain:   . Difficulty of Paying Living Expenses:   Food  Insecurity:   . Worried About Programme researcher, broadcasting/film/video in the Last Year:   . Barista in the Last Year:   Transportation Needs:   . Freight forwarder (Medical):   Marland Kitchen Lack of Transportation (Non-Medical):   Physical Activity:   . Days of Exercise per Week:   . Minutes of Exercise per Session:   Stress:   . Feeling of Stress :   Social Connections:   . Frequency of Communication with Friends and Family:   . Frequency of Social Gatherings with Friends and Family:   . Attends Religious Services:   . Active Member of Clubs or Organizations:   . Attends Banker Meetings:   Marland Kitchen Marital Status:   Intimate Partner Violence:   . Fear of Current or Ex-Partner:   . Emotionally Abused:   Marland Kitchen Physically Abused:   . Sexually Abused:    Family History  Problem Relation Age of Onset  . Asthma Mother   . Diabetes Father   . Hypertension Father   . Colon cancer Father 56   Past Surgical History:  Procedure Laterality Date  . NO PAST SURGERIES       Mardella Layman, MD 11/02/19 [phone]

## 2019-11-04 ENCOUNTER — Emergency Department (HOSPITAL_COMMUNITY)
Admission: EM | Admit: 2019-11-04 | Discharge: 2019-11-05 | Disposition: A | Discharge status: Home / Self Care | Payer: PRIVATE HEALTH INSURANCE | Attending: Emergency Medicine | Admitting: Emergency Medicine

## 2019-11-04 ENCOUNTER — Other Ambulatory Visit: Payer: Self-pay

## 2019-11-04 ENCOUNTER — Encounter (HOSPITAL_COMMUNITY): Payer: Self-pay | Admitting: *Deleted

## 2019-11-04 DIAGNOSIS — Z79899 Other long term (current) drug therapy: Secondary | ICD-10-CM | POA: Insufficient documentation

## 2019-11-04 DIAGNOSIS — F259 Schizoaffective disorder, unspecified: Secondary | ICD-10-CM | POA: Insufficient documentation

## 2019-11-04 DIAGNOSIS — R519 Headache, unspecified: Secondary | ICD-10-CM

## 2019-11-04 DIAGNOSIS — F17200 Nicotine dependence, unspecified, uncomplicated: Secondary | ICD-10-CM | POA: Insufficient documentation

## 2019-11-04 NOTE — ED Triage Notes (Signed)
Pt states migraine in his temples for one year. Went to UC over the weekend for the same, pain not improved.

## 2019-11-05 ENCOUNTER — Emergency Department (HOSPITAL_COMMUNITY): Payer: PRIVATE HEALTH INSURANCE

## 2019-11-05 ENCOUNTER — Encounter: Payer: Self-pay | Admitting: *Deleted

## 2019-11-05 MED ORDER — KETOROLAC TROMETHAMINE 60 MG/2ML IM SOLN
60.0000 mg | Freq: Once | INTRAMUSCULAR | Status: AC
  Administered 2019-11-05: 60 mg via INTRAMUSCULAR
  Filled 2019-11-05: qty 2

## 2019-11-05 NOTE — ED Provider Notes (Signed)
[place] EMERGENCY DEPARTMENT Provider Note  CSN: [id number] Arrival date & time: 11/04/19 1943    History Chief Complaint  Patient presents with  . Headache    HPI  Shane Small is a 34 y.o. male with history of schizoaffective disorder taking Abilify reports several months of chronic daily headache, located in bilateral temples and into bilateral jaw. Not improved with any OTC medications. Some improvement with decreasing his sugar intake. He reports some associated dizziness and easy fatigability with that. No photophobia or nausea. He was see at [facility] a few days ago for same and given IM injections with minimal improvement.     Past Medical History:  Diagnosis Date  . Asthma   . Schizophrenia (HCC)   . Stroke [facility])     Past Surgical History:  Procedure Laterality Date  . NO PAST SURGERIES      Family History  Problem Relation Age of Onset  . Asthma Mother   . Diabetes Father   . Hypertension Father   . Colon cancer Father 25    Social History   Tobacco Use  . Smoking status: Current Some Day Smoker    Types: Cigarettes  . Smokeless tobacco: Never Used  Vaping Use  . Vaping Use: Every day  Substance Use Topics  . Alcohol use: Yes    Alcohol/week: 56.0 standard drinks    Types: 28 Cans of beer, 28 Shots of liquor per week  . Drug use: No     Home Medications Prior to Admission medications   Medication Sig Start Date End Date Taking? Authorizing Provider  ARIPiprazole ER 400 MG SRER Inject 400 mg into the muscle every 28 (twenty-eight) days. (Due 10-11-16): For mood control Patient not taking: Reported on 08/11/2019 10/11/16   Armandina Stammer I, NP  benztropine (COGENTIN) 0.5 MG tablet Take 1 tablet (0.5 mg total) by mouth 2 (two) times daily. For prevention of drug induced tremors. Patient not taking: Reported on 08/11/2019 09/14/16   Armandina Stammer I, NP  traZODone (DESYREL) 50 MG tablet Take 1 tablet (50 mg total) by mouth at bedtime as needed for  sleep. Patient not taking: Reported on 08/11/2019 09/14/16   Armandina Stammer I, NP  atomoxetine (STRATTERA) 10 MG capsule Take 10 mg by mouth daily.  10/31/19  [provider]  omeprazole (PRILOSEC) 20 MG capsule Take 1 capsule (20 mg total) by mouth 2 (two) times daily before a meal. 09/19/19 10/31/19  Darr, Veryl Speak, PA-C     Allergies    Shellfish allergy   Review of Systems   Review of Systems A comprehensive review of systems was completed and negative except as noted in HPI.    Physical Exam BP 108/70 (BP Location: Right Arm)   Pulse 65   Temp 97.6 F (36.4 C) (Oral)   Resp 16   SpO2 100%   Physical Exam Vitals and nursing note reviewed.  Constitutional:      Appearance: Normal appearance.  HENT:     Head: Normocephalic and atraumatic.     Nose: Nose normal.     Mouth/Throat:     Mouth: Mucous membranes are moist.  Eyes:     Extraocular Movements: Extraocular movements intact.     Conjunctiva/sclera: Conjunctivae normal.  Cardiovascular:     Rate and Rhythm: Normal rate.  Pulmonary:     Effort: Pulmonary effort is normal.     Breath sounds: Normal breath sounds.  Abdominal:     General: Abdomen is flat.  Palpations: Abdomen is soft.     Tenderness: There is no abdominal tenderness.  Musculoskeletal:        General: No swelling. Normal range of motion.     Cervical back: Neck supple.  Skin:    General: Skin is warm and dry.  Neurological:     General: No focal deficit present.     Mental Status: He is alert.     Cranial Nerves: No cranial nerve deficit.     Sensory: No sensory deficit.     Motor: No weakness.     Coordination: Coordination normal.  Psychiatric:        Mood and Affect: Mood normal.      ED Results / Procedures / Treatments   Labs (all labs ordered are listed, but only abnormal results are displayed) Labs Reviewed - No data to display  EKG None  Radiology CT Head Wo Contrast  Result Date: 11/05/2019 CLINICAL DATA:  Right  temporal headache for a couple months with nausea and vomiting. EXAM: CT HEAD WITHOUT CONTRAST TECHNIQUE: Contiguous axial images were obtained from the base of the skull through the vertex without intravenous contrast. COMPARISON:  None. FINDINGS: Brain: No evidence of acute infarction, hemorrhage, hydrocephalus, extra-axial collection or mass lesion/mass effect. Vascular: No hyperdense vessel or unexpected calcification. Skull: Normal. Negative for fracture or focal lesion. Sinuses/Orbits: No acute finding. Other: None. IMPRESSION: No acute intracranial process. Electronically Signed   By: Emmaline Kluver M.D.   On: 11/05/2019 09:49    Procedures Procedures  Medications Ordered in the ED Medications  ketorolac (TORADOL) injection 60 mg (60 mg Intramuscular Given 11/05/19 0836)     MDM Rules/Calculators/A&P MDM Given daily headache and no previous neuro-imaging will check CT. IM Toradol ordered. Patient advised if CT is negative he will need outpatient Neurology followup for long term management of what is now essentially a chronic daily headache.  ED Course  I have reviewed the triage vital signs and the nursing notes.  Pertinent labs & imaging results that were available during my care of the patient were reviewed by me and considered in my medical decision making (see chart for details).  Clinical Course as of Nov 04 957  Thu Nov 05, 2019  0957 CT images and results reviewed, no concerning findings. Patient sleeping comfortably on reassessment. Recommend outpatient Neurology followup.    [CS]    Clinical Course User Index [CS] Pollyann Savoy, MD    Final Clinical Impression(s) / ED Diagnoses Final diagnoses:  Chronic daily headache    Rx / DC Orders ED Discharge Orders    None       Pollyann Savoy, MD 11/05/19 [phone]

## 2019-11-05 NOTE — ED Notes (Signed)
Pt being taking to CT.

## 2019-11-09 ENCOUNTER — Ambulatory Visit: Payer: Self-pay | Admitting: Diagnostic Neuroimaging

## 2019-11-09 ENCOUNTER — Encounter: Payer: Self-pay | Admitting: Diagnostic Neuroimaging

## 2019-11-09 ENCOUNTER — Telehealth: Payer: Self-pay | Admitting: *Deleted

## 2019-11-09 NOTE — Telephone Encounter (Signed)
Patient was no show for new patient appointment today. 

## 2020-04-08 ENCOUNTER — Other Ambulatory Visit: Payer: Self-pay

## 2020-04-08 ENCOUNTER — Telehealth (INDEPENDENT_AMBULATORY_CARE_PROVIDER_SITE_OTHER): Payer: No Payment, Other | Admitting: Psychiatry

## 2020-04-08 ENCOUNTER — Encounter (HOSPITAL_COMMUNITY): Payer: Self-pay | Admitting: Psychiatry

## 2020-04-08 DIAGNOSIS — F411 Generalized anxiety disorder: Secondary | ICD-10-CM | POA: Insufficient documentation

## 2020-04-08 DIAGNOSIS — F250 Schizoaffective disorder, bipolar type: Secondary | ICD-10-CM | POA: Insufficient documentation

## 2020-04-08 MED ORDER — ARIPIPRAZOLE ER 400 MG IM SRER: 400.0000 mg | INTRAMUSCULAR | 0 refills | Status: DC

## 2020-04-08 MED ORDER — INGREZZA 40 MG PO CAPS: 40.0000 mg | ORAL_CAPSULE | Freq: Every day | ORAL | 2 refills | Status: DC

## 2020-04-08 MED ORDER — ARIPIPRAZOLE ER 400 MG IM SRER: 400.0000 mg | INTRAMUSCULAR | 11 refills | Status: DC

## 2020-04-08 MED ORDER — ARIPIPRAZOLE ER 400 MG IM PRSY: 400.0000 mg | PREFILLED_SYRINGE | Freq: Once | INTRAMUSCULAR | Status: DC

## 2020-04-08 MED ORDER — BUSPIRONE HCL 10 MG PO TABS: 10.0000 mg | ORAL_TABLET | Freq: Three times a day (TID) | ORAL | 2 refills | Status: DC

## 2020-04-08 NOTE — Progress Notes (Signed)
Psychiatric Initial Adult Assessment  Virtual Visit via Video Note  I connected with [place] on 04/08/20 at 10:00 AM EST by a video enabled telemedicine application and verified that I am speaking with the correct person using two identifiers.  Location: Patient: Home Provider: Clinic   I discussed the limitations of evaluation and management by telemedicine and the availability of in person appointments. The patient expressed understanding and agreed to proceed.  I provided of non-face-to-face time during this encounter.    Patient Identification: [place] MRN:  [id number] Date of Evaluation:  04/08/2020 Referral Source: [place] Chief Complaint:  "I have a lot of anxiety" Visit Diagnosis:    ICD-10-CM   1. Schizoaffective disorder, bipolar type (HCC)  F25.0 Valbenazine Tosylate (INGREZZA) 40 MG CAPS    ARIPiprazole ER (ABILIFY MAINTENA) 400 MG SRER injection    ARIPiprazole ER (ABILIFY MAINTENA) 400 MG prefilled syringe 400 mg    DISCONTINUED: ARIPiprazole ER (ABILIFY MAINTENA) 400 MG SRER injection  2. Generalized anxiety disorder  F41.1 busPIRone (BUSPAR) 10 MG tablet    History of Present Illness: 34 year old male seen today for initial psychiatric evaluation. He was referred to outpatient psychiatry by [place] for medication management. He has a psychiatric history of Alcohol dependence, Schizoaffective disorder Bipolar type, PTSD, and tobacco dependence. He is currently managed on Abilify 400 mg injection Q 28 days, cogentin 0.5 mg twice daily, and trazodone 50 mg nightly. He notes that he only takes Abilify and notes that it is somewhat effective in managing his psychiatric conditions however noted that he has been more anxious and have abnormal bodily movements.  Today he is well groomed, pleasant, cooperative, engaged in conversation, and maintained eye contact. He described his mood as anxious. He notes that he experiences panic attack and noted to  calm him self down he isolated to a bathroom and places cold water on his face. He informed provider that he is constantly on edge, restless, and worries something awful might happen. Provider conducted a GAD  7 and patient scored a 15. Provider also conducted a PHQ 9 and patient scored a 13.   Today he denied SI/HI/VAH however noted that at times he is paranoid. He noted that his paranoia may come from past trauma. Patient endorsed past trauma however noted he did not want to discuss it. Provider endorsed understanding. He denies nightmares or flashbacks however informed provider that he avoids neighborhoods that remind him of his past. Provider encouraged patient to see a therapist however he informed writer that therapy was ineffective in the past.  Patient informed provider that he has abnormal bodily movements in his arms and face. He noted I feel like my brain is sending abnormal signals and stated "It like my body pops inside". Writer conducted an aims assessment virtually however it was difficult to assess abnormal movements online. Provider unable to detect abnormal movements. Patient is agreeable to start ingrezza 40 mg daily to help manage abnormal movements. He is also agreeable to start Buspar 10 mg three times daily to help manage anxiety. He will continue all other medications as prescribed. No other concerns noted a this time.    Associated Signs/Symptoms: Depression Symptoms:  psychomotor agitation, fatigue, anxiety, panic attacks, loss of energy/fatigue, (Hypo) Manic Symptoms:  Flight of Ideas, Irritable Mood, Anxiety Symptoms:  Excessive Worry, Panic Symptoms, Psychotic Symptoms:  Paranoia, PTSD Symptoms: Had a traumatic exposure:  Notes that he experienced trauma but did not want to discuss  Past  Psychiatric History: Alcohol dependence, Schizoaffective disorder Bipolar type, PTSD, and tobacco dependence  Previous Psychotropic Medications: Trazodone, abilify, stattera,  hdroxyzine (disliked), notes that one of the medications made his testicles hurt (noted he belived it was hydroxyzine. Writer informed him it was most likly trazodone)  Substance Abuse History in the last 12 months:  Yes.    Consequences of Substance Abuse: Medical Consequences:  Patient reports that he got a DWI two years ago  Past Medical History:  Past Medical History:  Diagnosis Date  . Asthma   . Daily headache   . Schizophrenia (HCC)   . Stroke [place](HCC)     Past Surgical History:  Procedure Laterality Date  . NO PAST SURGERIES      Family Psychiatric History: Maternal aunts cousin bipolar disorder  Family History:  Family History  Problem Relation Age of Onset  . Asthma Mother   . Diabetes Father   . Hypertension Father   . Colon cancer Father [id number]    Social History:   Social History   Socioeconomic History  . Marital status: Single    Spouse name: Not on file  . Number of children: 1  . Years of education: Not on file  . Highest education level: Not on file  Occupational History  . Occupation: Location managermachine operator  Tobacco Use  . Smoking status: Current Some Day Smoker    Types: Cigarettes  . Smokeless tobacco: Never Used  Vaping Use  . Vaping Use: Every day  Substance and Sexual Activity  . Alcohol use: Yes    Alcohol/week: 56.0 standard drinks    Types: 28 Cans of beer, 28 Shots of liquor per week  . Drug use: No  . Sexual activity: Not on file  Other Topics Concern  . Not on file  Social History Narrative  . Not on file   Social Determinants of Health   Financial Resource Strain: Not on file  Food Insecurity: Not on file  Transportation Needs: Not on file  Physical Activity: Not on file  Stress: Not on file  Social Connections: Not on file    Additional Social History: Patient resides in [place]. He is in a relationship and has one 33 year old daughter. He endorses occasional alcohol use. He denies tobacco or illegal substance use. 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 Shellfish Allergy Other (See Comments)    unk    Metabolic Disorder Labs: Lab Results  Component Value Date   HGBA1C 4.7 (L) 09/11/2016   MPG 88 09/11/2016   Lab Results  Component Value Date   PROLACTIN 8.1 09/11/2016   Lab Results  Component Value Date   CHOL 211 (H) 09/11/2016   TRIG 102 09/11/2016   HDL 87 09/11/2016   CHOLHDL 2.4 09/11/2016   VLDL 20 09/11/2016   LDLCALC 104 (H) 09/11/2016   Lab Results  Component Value Date   TSH 1.079 09/11/2016    Therapeutic Level Labs: No results found for: LITHIUM No results found for: CBMZ No results found for: VALPROATE  Current Medications: Current Outpatient Medications  Medication Sig Dispense Refill  . ARIPiprazole ER (ABILIFY MAINTENA) 400 MG SRER injection Inject 2 mLs (400 mg total) into the muscle every 28 (twenty-eight) days. (Due 10-11-16): For mood control 1 each 11  . benztropine (COGENTIN) 0.5 MG tablet Take 1 tablet (0.5 mg total) by mouth 2 (two) times daily. For prevention of drug induced tremors. (Patient  not taking: Reported on 08/11/2019) 60 tablet 0  . busPIRone (BUSPAR) 10 MG tablet Take 1 tablet (10 mg total) by mouth 3 (three) times daily. 90 tablet 2  . traZODone (DESYREL) 50 MG tablet Take 1 tablet (50 mg total) by mouth at bedtime as needed for sleep. (Patient not taking: Reported on 08/11/2019) 30 tablet 0  . Valbenazine Tosylate (INGREZZA) 40 MG CAPS Take 1 capsule (40 mg total) by mouth daily. 30 capsule 2   Current Facility-Administered Medications  Medication Dose Route Frequency Provider Last Rate Last Admin  . ARIPiprazole ER (ABILIFY MAINTENA) 400 MG prefilled syringe 400 mg  400 mg Intramuscular Once Shanna Cisco, NP        Musculoskeletal: Strength & Muscle Tone: Unable to assess due to telehealth visit Gait & Station: Unable to assess due to telehealth visit Patient leans: N/A  Psychiatric  Specialty Exam: Review of Systems  There were no vitals taken for this visit.There is no height or weight on file to calculate BMI.  General Appearance: Well Groomed  Eye Contact:  Good  Speech:  Clear and Coherent  Volume:  Normal  Mood:  Anxious and Depressed  Affect:  Appropriate and Congruent  Thought Process:  Coherent, Goal Directed and Linear  Orientation:  Full (Time, Place, and Person)  Thought Content:  WDL and Logical  Suicidal Thoughts:  No  Homicidal Thoughts:  No  Memory:  Immediate;   Good Recent;   Good Remote;   Good  Judgement:  Good  Insight:  Good  Psychomotor Activity:  Normal  Concentration:  Concentration: Good and Attention Span: Good  Recall:  Good  Fund of Knowledge:Good  Language: Good  Akathisia:  No  Handed:  Right  AIMS (if indicated):  Done virtually. Provider unable to detect abnormal movements however patient verbalize abnormal movements  Assets:  Communication Skills Desire for Improvement Financial Resources/Insurance Housing Social Support  ADL's:  Intact  Cognition: WNL  Sleep:  Good   Screenings: AIMS   Flowsheet Row [facility]  AIMS Total Score 0    AUDIT   Flowsheet Row [facility]  Alcohol Use Disorder Identification Test Final Score (AUDIT) 2    GAD-7   Flowsheet Row Video Visit from 04/08/2020 in [facility]  Total GAD-7 Score 15    PHQ2-9   Flowsheet Row Video Visit from 04/08/2020 in [facility]  PHQ-2 Total Score 3  PHQ-9 Total Score 13      Assessment and Plan: Patient endorses increased anxiety and abnormal bodily movements. Writer conducted an aims assessment virtually however it was difficult to assess abnormal movements online. Provider unable to detect abnormal movements. Patient is agreeable to start  ingrezza 40 mg daily to help manage abnormal movements. He is also agreeable to start Buspar 10 mg three times daily to help manage anxiety. He will continue all other medications as prescribed.  1. Schizoaffective disorder, bipolar type (HCC)  Continue- ARIPiprazole ER (ABILIFY MAINTENA) 400 MG SRER injection; Inject 2 mLs (400 mg total) into the muscle every 28 (twenty-eight) days. (Due 10-11-16): For mood control  Dispense: 1 each; Refill: 0 Start- Valbenazine Tosylate (INGREZZA) 40 MG CAPS; Take 1 capsule (40 mg total) by mouth daily.  Dispense: 30 capsule; Refill: 2  2. Generalized anxiety disorder  start- busPIRone (BUSPAR) 10 MG tablet; Take 1 tablet (10 mg total)  by mouth 3 (three) times daily.  Dispense: 90 tablet; Refill: 2  Follow up in 3 months   Shanna Cisco, NP 12/10/202110:51 AM

## 2020-05-04 ENCOUNTER — Ambulatory Visit
Admission: EM | Admit: 2020-05-04 | Discharge: 2020-05-04 | Disposition: A | Discharge status: Home / Self Care | Payer: Self-pay

## 2020-05-04 ENCOUNTER — Ambulatory Visit (HOSPITAL_COMMUNITY)
Admission: EM | Admit: 2020-05-04 | Discharge: 2020-05-04 | Disposition: A | Discharge status: Home / Self Care | Payer: BC Managed Care – PPO | Attending: Family Medicine | Admitting: Family Medicine

## 2020-05-04 ENCOUNTER — Encounter (HOSPITAL_COMMUNITY): Payer: Self-pay

## 2020-05-04 DIAGNOSIS — J4541 Moderate persistent asthma with (acute) exacerbation: Secondary | ICD-10-CM

## 2020-05-04 MED ORDER — ALBUTEROL SULFATE HFA 108 (90 BASE) MCG/ACT IN AERS: 1.0000 | INHALATION_SPRAY | Freq: Four times a day (QID) | RESPIRATORY_TRACT | 2 refills | Status: DC | PRN

## 2020-05-04 MED ORDER — ALBUTEROL SULFATE HFA 108 (90 BASE) MCG/ACT IN AERS
INHALATION_SPRAY | RESPIRATORY_TRACT | Status: AC
  Filled 2020-05-04: qty 6.7

## 2020-05-04 MED ORDER — ALBUTEROL SULFATE HFA 108 (90 BASE) MCG/ACT IN AERS
2.0000 | INHALATION_SPRAY | Freq: Once | RESPIRATORY_TRACT | Status: AC
  Administered 2020-05-04: 2 via RESPIRATORY_TRACT

## 2020-05-04 MED ORDER — PREDNISONE 20 MG PO TABS: 40.0000 mg | ORAL_TABLET | Freq: Every day | ORAL | 0 refills | Status: DC

## 2020-05-04 NOTE — ED Notes (Addendum)
Rapid triage initial assessment completed 2/2 c/c of "asthma attack and med refill". Pt reports increased intermittent SOB over the past several weeks, worse last night. Able to speak full sentences w/o difficulty, SpO2 100%, wheezes auscultated throughout. No acute distress. Pt instructed to return to lobby for tx room availability. Per R. Lane, pt given two puffs albuterol and instructed to remain in waiting room until room available.

## 2020-05-04 NOTE — ED Provider Notes (Signed)
[facility]   [id number] 05/04/20 Arrival Time: 0841  ASSESSMENT & PLAN:  1. Moderate persistent asthma with exacerbation     Much improvement after albuterol.  Meds ordered this encounter  Medications  . albuterol (VENTOLIN HFA) 108 (90 Base) MCG/ACT inhaler 2 puff  . albuterol (VENTOLIN HFA) 108 (90 Base) MCG/ACT inhaler    Sig: Inhale 1-2 puffs into the lungs every 6 (six) hours as needed for wheezing or shortness of breath.    Dispense:  1 each    Refill:  2  . predniSONE (DELTASONE) 20 MG tablet    Sig: Take 2 tablets (40 mg total) by mouth daily.    Dispense:  10 tablet    Refill:  0     Follow-up Information    [facility]   Specialty: Urgent Care Why: If worsening or failing to improve as anticipated. Contact information: [address] [place] [address] [phone]              Reviewed expectations re: course of current medical issues. Questions answered. Outlined signs and symptoms indicating need for more acute intervention. Understanding verbalized. After Visit Summary given.   SUBJECTIVE: History from: patient. [place] is a 35 y.o. male who reports asthma exacerbation; wheezing; few days. No SOB. Denies: fever. Normal PO intake without n/v/d.    OBJECTIVE:  Vitals:   05/04/20 0851 05/04/20 0903  BP: (!) 146/86   Pulse: 76   Resp: 16   Temp: (!) 97.3 F (36.3 C)   TempSrc: Oral   SpO2: 100% 100%    General appearance: alert; no distress Eyes: PERRLA; EOMI; conjunctiva normal HENT: [place]; AT; without nasal congestion Neck: supple  Lungs: speaks full sentences without difficulty; unlabored; bilateral wheezes Extremities: no edema Skin: warm and dry Neurologic: normal gait Psychological: alert and cooperative; normal mood and affect   Allergies  Allergen Reactions  . Shellfish Allergy Other (See Comments)    unk    Past Medical History:  Diagnosis Date  . Asthma    . Daily headache   . Schizophrenia (HCC)   . Stroke [facility])    Social History   Socioeconomic History  . Marital status: Single    Spouse name: Not on file  . Number of children: 1  . Years of education: Not on file  . Highest education level: Not on file  Occupational History  . Occupation: Location manager  Tobacco Use  . Smoking status: Current Some Day Smoker    Types: Cigarettes  . Smokeless tobacco: Never Used  Vaping Use  . Vaping Use: Every day  Substance and Sexual Activity  . Alcohol use: Yes    Alcohol/week: 56.0 standard drinks    Types: 28 Cans of beer, 28 Shots of liquor per week  . Drug use: No  . Sexual activity: Not on file  Other Topics Concern  . Not on file  Social History Narrative  . Not on file   Social Determinants of Health   Financial Resource Strain: Not on file  Food Insecurity: Not on file  Transportation Needs: Not on file  Physical Activity: Not on file  Stress: Not on file  Social Connections: Not on file  Intimate Partner Violence: Not on file   Family History  Problem Relation Age of Onset  . Asthma Mother   . Diabetes Father   . Hypertension Father   . Colon cancer Father 34   Past Surgical History:  Procedure Laterality Date  . NO PAST SURGERIES       Mardella Layman, MD 05/04/20 1047

## 2020-05-04 NOTE — ED Triage Notes (Signed)
See quick triage note.

## 2020-07-07 ENCOUNTER — Other Ambulatory Visit: Payer: Self-pay

## 2020-07-07 ENCOUNTER — Telehealth (HOSPITAL_COMMUNITY): Payer: No Payment, Other | Admitting: Psychiatry

## 2020-11-30 ENCOUNTER — Emergency Department (HOSPITAL_COMMUNITY)
Admission: EM | Admit: 2020-11-30 | Discharge: 2020-12-01 | Disposition: A | Discharge status: Home / Self Care | Payer: BC Managed Care – PPO | Attending: Emergency Medicine | Admitting: Emergency Medicine

## 2020-11-30 ENCOUNTER — Other Ambulatory Visit: Payer: Self-pay

## 2020-11-30 ENCOUNTER — Encounter (HOSPITAL_COMMUNITY): Payer: Self-pay | Admitting: Emergency Medicine

## 2020-11-30 DIAGNOSIS — R519 Headache, unspecified: Secondary | ICD-10-CM | POA: Insufficient documentation

## 2020-11-30 DIAGNOSIS — H539 Unspecified visual disturbance: Secondary | ICD-10-CM | POA: Insufficient documentation

## 2020-11-30 DIAGNOSIS — R110 Nausea: Secondary | ICD-10-CM | POA: Insufficient documentation

## 2020-11-30 DIAGNOSIS — Z5321 Procedure and treatment not carried out due to patient leaving prior to being seen by health care provider: Secondary | ICD-10-CM | POA: Insufficient documentation

## 2020-11-30 DIAGNOSIS — R42 Dizziness and giddiness: Secondary | ICD-10-CM | POA: Insufficient documentation

## 2020-11-30 LAB — COMPREHENSIVE METABOLIC PANEL
ALT: 19 U/L (ref 0–44)
AST: 29 U/L (ref 15–41)
Albumin: 4.4 g/dL (ref 3.5–5.0)
Alkaline Phosphatase: 54 U/L (ref 38–126)
Anion gap: 12 (ref 5–15)
BUN: 6 mg/dL (ref 6–20)
CO2: 22 mmol/L (ref 22–32)
Calcium: 9.1 mg/dL (ref 8.9–10.3)
Chloride: 101 mmol/L (ref 98–111)
Creatinine, Ser: 0.89 mg/dL (ref 0.61–1.24)
GFR, Estimated: >60 mL/min (ref >60)
Glucose, Bld: 87 mg/dL (ref 70–99)
Potassium: 3.8 mmol/L (ref 3.5–5.1)
Sodium: 135 mmol/L (ref 135–145)
Total Bilirubin: 1 mg/dL (ref 0.3–1.2)
Total Protein: 7.4 g/dL (ref 6.5–8.1)

## 2020-11-30 LAB — URINALYSIS, ROUTINE W REFLEX MICROSCOPIC
Bacteria, UA: NONE SEEN
Bilirubin Urine: NEGATIVE
Glucose, UA: NEGATIVE mg/dL
Ketones, ur: NEGATIVE mg/dL
Leukocytes,Ua: NEGATIVE
Nitrite: NEGATIVE
Protein, ur: NEGATIVE mg/dL
Specific Gravity, Urine: 1.01 (ref 1.005–1.030)
pH: 8 (ref 5.0–8.0)

## 2020-11-30 LAB — CBC WITH DIFFERENTIAL/PLATELET
Abs Immature Granulocytes: 0.01 10*3/uL (ref 0.00–0.07)
Basophils Absolute: 0 10*3/uL (ref 0.0–0.1)
Basophils Relative: 1 %
Eosinophils Absolute: 0.2 10*3/uL (ref 0.0–0.5)
Eosinophils Relative: 4 %
HCT: 41 % (ref 39.0–52.0)
Hemoglobin: 14 g/dL (ref 13.0–17.0)
Immature Granulocytes: 0 %
Lymphocytes Relative: 34 %
Lymphs Abs: 1.6 10*3/uL (ref 0.7–4.0)
MCH: 33.1 pg (ref 26.0–34.0)
MCHC: 34.1 g/dL (ref 30.0–36.0)
MCV: 96.9 fL (ref 80.0–100.0)
Monocytes Absolute: 0.5 10*3/uL (ref 0.1–1.0)
Monocytes Relative: 11 %
Neutro Abs: 2.4 10*3/uL (ref 1.7–7.7)
Neutrophils Relative %: 50 %
Platelets: 291 10*3/uL (ref 150–400)
RBC: 4.23 MIL/uL (ref 4.22–5.81)
RDW: 11.9 % (ref 11.5–15.5)
WBC: 4.7 10*3/uL (ref 4.0–10.5)
nRBC: 0 % (ref 0.0–0.2)

## 2020-11-30 NOTE — ED Provider Notes (Signed)
Emergency Medicine Provider Triage Evaluation Note  [place]ane [place] , a 35 y.o. male  was evaluated in triage.  Pt complains of headache, vision changes, dizziness.  States this has been going on a long time.  Worsened today.  States he has been evaluated before but not had any answers.  He has not followed up with neurology.  States he takes no medications daily. Also reports urinary sxs   Review of Systems  Positive: Ha, urinary sxs Negative: trauma  Physical Exam  BP (!) 142/88 (BP Location: Left Arm)   Pulse 85   Temp 98.8 F (37.1 C) (Oral)   Resp 16   Ht 5\' 11"  (1.803 m)   Wt 72.6 kg   SpO2 98%   BMI 22.32 kg/m  Gen:   Awake, no distress   Resp:  Normal effort  MSK:   Moves extremities without difficulty  Other:  CN intact.  Strength and sensation intact x4.  EOMI.  Medical Decision Making  Medically screening exam initiated at 7:56 PM.  Appropriate orders placed.  was informed that the remainder of the evaluation will be completed by another provider, this initial triage assessment does not replace that evaluation, and the importance of remaining in the ED until their evaluation is complete.  Labs and ua.    [name], PA-C 11/30/20 01/30/21    Brooke Pace, MD 11/30/20 2337

## 2020-11-30 NOTE — ED Triage Notes (Signed)
Pt states for as long as he can remember he has been getting lightheaded, dizzy, sharp pain behind his eyes. States he has been seen multiple times for this and nothing has been done. Pt endorses nausea when eating

## 2020-12-01 NOTE — ED Notes (Signed)
PT LWBS °

## 2021-07-07 ENCOUNTER — Ambulatory Visit (HOSPITAL_COMMUNITY): Admit: 2021-07-07 | Payer: No Payment, Other | Admitting: Psychiatry

## 2021-07-07 NOTE — H&P (Signed)
Behavioral Health Medical Screening Exam ? ?Shane Small is a 35 y.o. male with a history of schizoaffective disorder presenting to [facility] voluntarily unaccompanied to get connected services in order to restart his medications. Patient states that he is living in his car outside of his parents house. Patient reports that he does not get along with hs parents. Patient reports that he has been having difficulty maintaining employment and two weeks ago he was arrested because he got in a fight with a Emergency planning/management officer. Patient reports that he stopped his medications about two years ago. Patient reports that he was on abilify maintena 400mg  IM but reports that the abilify caused drowsiness, and sexual side effects. Patient reports that he wants to to discuss a different medication that he can take with less side effects. Patient was seen at the [facility] outpatient clinic in the past and wants to get reestablished with them to restart medications and therapy. Pt denies any substance use but states that he drinks 1-2 beers a day. Pt endorsed some paranoia stating that he feels people are watching him sometime and knows what he is thinking. Pt denies any SI or HI but endorses hearing women voices that guide him to do the right thing. Patient is in agreement to f/u with [facility] outpatient services.  ? ? ? ? ?Total Time spent with patient: 30 minutes ? ?Psychiatric Specialty Exam: ? ?Presentation  ?General Appearance: Casual ? ?Eye Contact:Fair ? ?Speech:Clear and Coherent ? ?Speech Volume:Normal ? ?Handedness:Right ? ? ?Mood and Affect  ?Mood:Euthymic ? ?Affect:Appropriate ? ? ?Thought Process  ?Thought Processes:Goal Directed ? ?Descriptions of Associations:Tangential ? ?Orientation:Full (Time, Place and Person) ? ?Thought Content:Paranoid Ideation ? ?History of Schizophrenia/Schizoaffective disorder:Yes ? ?Duration of Psychotic Symptoms:Greater than six months ? ?Hallucinations:Hallucinations: Auditory ?Description of  Auditory Hallucinations: hears women voices ? ?Ideas of Reference:Paranoia ? ?Suicidal Thoughts:Suicidal Thoughts: No ? ?Homicidal Thoughts:Homicidal Thoughts: No ? ? ?Sensorium  ?Memory:Immediate Good; Recent Good; Remote Good ? ?Judgment:Fair ? ?Insight:Fair ? ? ?Executive Functions  ?Concentration:Good ? ?Attention Span:Good ? ?Recall:Good ? ?Fund of Knowledge:Good ? ?Language:Good ? ? ?Psychomotor Activity  ?Psychomotor Activity:Psychomotor Activity: Normal ? ? ?Assets  ?Assets:Communication Skills; Resilience; Desire for Improvement ? ? ?Sleep  ?Sleep:Sleep: Fair ?Number of Hours of Sleep: -1 ? ? ? ?Physical Exam: ?Physical Exam ?HENT:  ?   Head: Normocephalic and atraumatic.  ?   Nose: Nose normal.  ?Eyes:  ?   Pupils: Pupils are equal, round, and reactive to light.  ?Cardiovascular:  ?   Rate and Rhythm: Normal rate.  ?Pulmonary:  ?   Effort: Pulmonary effort is normal.  ?Abdominal:  ?   General: Abdomen is flat.  ?Musculoskeletal:     ?   General: Normal range of motion.  ?   Cervical back: Normal range of motion.  ?Skin: ?   General: Skin is warm.  ?Neurological:  ?   General: No focal deficit present.  ?   Mental Status: He is alert.  ?Psychiatric:     ?   Mood and Affect: Mood normal.  ? ?Review of Systems  ?Constitutional: Negative.   ?HENT: Negative.    ?Eyes: Negative.   ?Respiratory: Negative.    ?Cardiovascular: Negative.   ?Gastrointestinal: Negative.   ?Genitourinary: Negative.   ?Musculoskeletal: Negative.   ?Skin: Negative.   ?Neurological: Negative.   ?Endo/Heme/Allergies: Negative.   ?Psychiatric/Behavioral:  Positive for depression and hallucinations.   ?There were no vitals taken for this visit. There is  no height or weight on file to calculate BMI. ? ?Musculoskeletal: ?Strength & Muscle Tone: within normal limits ?Gait & Station: normal ?Patient leans: N/A ? ? ?Recommendations: ? ?Based on my evaluation the patient does not appear to have an emergency medical condition. Patient was provided  with resources to be able to follow up in the community for mental health treatment. . ? ?Jasper Riling, NP ?07/07/2021, 10:46 PM ? ?

## 2021-07-14 ENCOUNTER — Ambulatory Visit (HOSPITAL_COMMUNITY)
Admission: EM | Admit: 2021-07-14 | Discharge: 2021-07-14 | Disposition: A | Discharge status: Home / Self Care | Payer: No Payment, Other | Attending: Urology | Admitting: Urology

## 2021-07-14 DIAGNOSIS — F22 Delusional disorders: Secondary | ICD-10-CM

## 2021-07-14 DIAGNOSIS — F250 Schizoaffective disorder, bipolar type: Secondary | ICD-10-CM

## 2021-07-14 MED ORDER — OLANZAPINE 5 MG PO TABS: 5.0000 mg | ORAL_TABLET | Freq: Every day | ORAL | 0 refills | Status: DC

## 2021-07-14 MED ORDER — BENZTROPINE MESYLATE 0.5 MG PO TABS: 0.5000 mg | ORAL_TABLET | Freq: Two times a day (BID) | ORAL | 0 refills | Status: DC

## 2021-07-14 NOTE — ED Triage Notes (Signed)
Pt presents to [facility] voluntarily with a complaint of passive SI and auditory hallucinations. Pt states that he has been having SI "for a while" ,and recently has been having HI towards the [employer]. Pt reports that he is diagnosed with paranoid schizophrenia and was seen at [facility] in the past but has not seen a psychiatrist since leaving [facility]. Pt states that he was transferred to [facility] outpatient but has not had an appointment yet. Pt states that he was prescribed medication for his diagnoses in the past but " threw it in the trash" because it didn't work. Pt appears to be agitated during triage process. Pt states that he has been having anger outbursts and is makes him want to harm/kill others. Pt states that he is hearing voices that tell him " Don't go to sleep, don't eat". Pt appears unkempt. Pt states " I need a list of medications to try, so I can see what works". Pt endorses SI/HI but has not plan. Pt endorses auditory hallucinations but not currently. ?

## 2021-07-14 NOTE — ED Provider Notes (Signed)
Behavioral Health Urgent Care Medical Screening Exam ? ?Patient Name: Shane Small ?MRN: [id number] ?Date of Evaluation: 07/14/21 ?Chief Complaint:   ?Diagnosis:  ?Final diagnoses:  ?Schizoaffective disorder, bipolar type (HCC)  ?Paranoia (HCC)  ? ? ?History of Present illness: Shane Small is a 36 y.o. male with psychiatric history of schizoaffective disorder, bipolar type, alcohol abuse and paranoia.  Patient presented voluntarily to [facility] for a walk in assessment. ? ?Patient reports that he is having frequent anger outburst.  He reports that he is stressed due to relationship difficulties with his parents.  He reports that he is living in his car outside of his parent's home and sometimes will get in verbal altercation with his parents.  He also expresses frustration with [employer] customer service agents.  Patient rambles about being a tax payer and not being able to obtain quality customer service from [employer] agents.  He says that he came to [facility] today to discuss medication options as well as speak to a therapist about his mental health.  Patient expressed desire to establish care with outpatient psychiatric provider as well as therapist.  He reports that he was previously on monthly Abilify Maintena 400 mg IM injection.  He says that he does not want to be placed back on Abilify due to negative side effects (poor libido, drowsiness, and tiredness) and would like to discuss options for medication management. ? ?Patient acknowledges that he sometimes has passive suicidal ideation.  he denies current suicidal ideation, plan or intent.  He denies active homicidal ideation however he reports desire to harm an [employer] agent earlier today on the phone because " they were very rude."  Patient endorses chronic auditory hallucination of  "voices telling me do not eat, do not shave, do not leave the house."  Patient reports that the voice does not bother him and that he is able to continue his normal day without  distraction.  Patient reports that he drinks 1 can of beer daily ; he denies all other substance abuse. ? ?Psychiatric Specialty Exam ? ?Presentation  ?General Appearance:Appropriate for Environment ? ?Eye Contact:Good ? ?Speech:Clear and Coherent ? ?Speech Volume:Normal ? ?Handedness:Right ? ? ?Mood and Affect  ?Mood:Euthymic ? ?Affect:Congruent ? ? ?Thought Process  ?Thought Processes:Coherent ? ?Descriptions of Associations:Intact ? ?Orientation:Full (Time, Place and Person) ? ?Thought Content:WDL ? Diagnosis of Schizophrenia or Schizoaffective disorder in past: Yes ? Duration of Psychotic Symptoms: Greater than six months ? Hallucinations:Auditory ?"voices telling me dont eat, dont shave, don't leave the house." ? ?Ideas of Reference:Paranoia ? ?Suicidal Thoughts:No ? ?Homicidal Thoughts:Yes, Passive ?Without Plan; Without Intent ? ? ?Sensorium  ?Memory:Immediate Good; Recent Good ? ?Judgment:Fair ? ?Insight:Fair ? ? ?Executive Functions  ?Concentration:Good ? ?Attention Span:Good ? ?Recall:Fair ? ?Fund of Knowledge:Good ? ?Language:Good ? ? ?Psychomotor Activity  ?Psychomotor Activity:Normal ? ? ?Assets  ?Assets:Communication Skills; Desire for Improvement; Physical Health ? ? ?Sleep  ?Sleep:Fair ? ?Number of hours: 6 ? ? ?No data recorded ? ?Physical Exam: ?Physical Exam ?Vitals and nursing note reviewed.  ?Constitutional:   ?   General: He is not in acute distress. ?   Appearance: He is well-developed.  ?HENT:  ?   Head: Normocephalic and atraumatic.  ?Eyes:  ?   Conjunctiva/sclera: Conjunctivae normal.  ?Cardiovascular:  ?   Rate and Rhythm: Normal rate.  ?Pulmonary:  ?   Effort: Pulmonary effort is normal. No respiratory distress.  ?   Breath sounds: Normal breath sounds.  ?Abdominal:  ?  Palpations: Abdomen is soft.  ?   Tenderness: There is no abdominal tenderness.  ?Musculoskeletal:     ?   General: No swelling.  ?   Cervical back: Neck supple.  ?Skin: ?   General: Skin is warm and dry.  ?   Capillary  Refill: Capillary refill takes less than 2 seconds.  ?Neurological:  ?   Mental Status: He is alert and oriented to person, place, and time.  ?Psychiatric:     ?   Attention and Perception: Attention and perception normal.     ?   Mood and Affect: Mood is anxious.     ?   Speech: Speech normal.     ?   Behavior: Behavior normal. Behavior is cooperative.     ?   Thought Content: Thought content is not paranoid. Thought content does not include suicidal ideation. Thought content does not include suicidal plan.     ?   Cognition and Memory: Cognition normal.  ? ?Review of Systems  ?Constitutional: Negative.   ?HENT: Negative.    ?Eyes: Negative.   ?Respiratory: Negative.    ?Cardiovascular: Negative.   ?Gastrointestinal: Negative.   ?Genitourinary: Negative.   ?Musculoskeletal: Negative.   ?Skin: Negative.   ?Neurological: Negative.   ?Endo/Heme/Allergies: Negative.   ?Psychiatric/Behavioral:  Positive for hallucinations. The patient is nervous/anxious.   ?Blood pressure (!) 145/93, pulse 97, temperature 99 ?F (37.2 ?C), temperature source Oral, resp. rate 18, SpO2 98 %. There is no height or weight on file to calculate BMI. ? ?Musculoskeletal: ?Strength & Muscle Tone: within normal limits ?Gait & Station: normal ?Patient leans: Right ? ? ?[facility] MSE Discharge Disposition for Follow up and Recommendations: ?Based on my evaluation the patient does not appear to have an emergency medical condition and can be discharged with resources and follow up care in outpatient services for Medication Management and Individual Therapy ? ?Discussed starting patient Zyprexa 5 mg/day and admission to [facility] for continuous assessment to evaluate effectiveness of Zyprexa however patient was not in agreement with admission.  Patient is agreeable to taking Zyprexa 5 mg/day and following up with outpatient psychiatric provider.  Patient was provided information about establishing care with outpatient providers as well as information for Open  Access. ? ?No evidence of imminent danger to self or others at this time. Patient does not meet criteria for psychiatric admission or IVC. Supportive therapy provided about ongoing stressors. Discussed crisis plan, callling 911/988 or going to Emergency Dept ? ? ? ?Maricela Bo, NP ?07/14/2021, 9:18 PM ? ?

## 2021-07-14 NOTE — Discharge Instructions (Addendum)
You are encouraged to follow up with [facility] for outpatient treatment. ? ?Walk in/ Open Access Hours: ?Monday - Friday 8AM - 11AM (To see provider and therapist) - Arrive around 7 or 7:15 to have a better chance of being seen, as slots fill up.   ?Friday - 1PM - 4PM (To see therapist only) ? ?[facility] ?[address] ?[name], [name] ?[phone] ? ? ?Discharge recommendations:  ?Patient is to take medications as prescribed. ?Please see information for follow-up appointment with psychiatry and therapy. ?Please follow up with your primary care provider for all medical related needs.  ? ?Therapy: We recommend that patient participate in individual therapy to address mental health concerns. ? ?Medications: The parent/guardian is to contact a medical professional and/or outpatient provider to address any new side effects that develop. Parent/guardian should update outpatient providers of any new medications and/or medication changes.  ? ?Atypical antipsychotics: If you are prescribed an atypical antipsychotic, it is recommended that your height, weight, BMI, blood pressure, fasting lipid panel, and fasting blood sugar be monitored by your outpatient providers. ? ?Safety:  ?The patient should abstain from use of illicit substances/drugs and abuse of any medications. ?If symptoms worsen or do not continue to improve or if the patient becomes actively suicidal or homicidal then it is recommended that the patient return to the closest hospital emergency department, the [facility], or call 911 for further evaluation and treatment. ?National Suicide Prevention Lifeline 1-800-SUICIDE or 1-800-273-8255. ? ?About 988 ?988 offers 24/7 access to trained crisis counselors who can help people experiencing mental health-related distress. People can call or text 988 or chat 988lifeline.org for themselves or if they are worried about a loved one who may need  crisis support. ? ?

## 2021-08-11 ENCOUNTER — Telehealth (HOSPITAL_COMMUNITY): Payer: Self-pay | Admitting: Emergency Medicine

## 2021-08-11 NOTE — BH Assessment (Signed)
Care Management - [facility] Follow Up Discharges  ? ?Writer attempted to make contact with patient today and was unsuccessful.  No voicemail. ? ?Per chart review, patient has a follow up appointment at [facility] on 08-24-21 with Winnifred Friar, PA  ? ?

## 2021-08-24 ENCOUNTER — Ambulatory Visit (HOSPITAL_COMMUNITY): Payer: No Payment, Other | Admitting: Physician Assistant

## 2021-09-06 ENCOUNTER — Ambulatory Visit (HOSPITAL_COMMUNITY)
Admission: EM | Admit: 2021-09-06 | Discharge: 2021-09-06 | Disposition: A | Discharge status: Home / Self Care | Payer: No Payment, Other | Attending: Psychiatry | Admitting: Psychiatry

## 2021-09-06 DIAGNOSIS — F250 Schizoaffective disorder, bipolar type: Secondary | ICD-10-CM | POA: Insufficient documentation

## 2021-09-06 DIAGNOSIS — Z046 Encounter for general psychiatric examination, requested by authority: Secondary | ICD-10-CM

## 2021-09-06 DIAGNOSIS — Z5900 Homelessness unspecified: Secondary | ICD-10-CM | POA: Insufficient documentation

## 2021-09-06 NOTE — ED Provider Notes (Signed)
Behavioral Health Urgent Care Medical Screening Exam ? ?Patient Name: Shane Small ?MRN: [id number] ?Date of Evaluation: 09/06/21 ?Chief Complaint:   ?Diagnosis:  ?Final diagnoses:  ?Psychiatric exam requested by authority  ?Schizoaffective disorder, bipolar type ([place])  ? ? ?History of Present illness: Shane Small is a 36 y.o. male with a history of schizoaffective disorder, cannabis use disorder, SIMD  and alcohol use who presents to the [facility] on 09/06/2021 for the purpose of obtaining a work note. ? ?Per chart review, and patient was recently seen on 07/14/2021 at which point he was determined to not have met criteria and was provided a prescription for Zyprexa 5 mg nightly.  Patient had previously been seen by provider Burt Ek at the [facility] (last seen 03/2020)and had a missed appointment with new provider PA Nwoko on 08/24/2021. ? ?Patient interviewed in assessment room-he is calm, cooperative and pleasant throughout.  No objective signs or symptoms of mania or psychosis.  Patient states that he presented to the [facility] today because "I am having issues at my job".  Patient goes on to state that when he was at work on Sunday it was very hot and he felt as though "there was electric current in the air". He states that he called the fire department with his concerns and that after they assessed the situation, there were no safety concerns.  Patient states that he was at work today prior to coming in and that his boss recommended he obtain a work note in order for him to continue working.  Patient states that he works at [employer]; he describes liking the job although reports occasional conflict with coworkers. ? ?Patient denies SI/HI/AVH.  He reports reports vague paranoia of " seeing the same vehicles in the same area".  He denies that this makes him feel unsafe.  No thought insertion/thought Health visitor.  Patient reports that he has been taking  Zyprexa that was previously prescribed and  that he has found it to be helpful.  Patient states that he has not established care yet with a regular outpatient provider.  Discussed open access hours at the [facility] and advised that he present for an appointment during walk-in hours.  Patient verbalized understanding and was in agreement. ? ?Obtained from chart review and patient interview ?Past Psychiatric History: ?Previous Medication Trials: Trazodone, Abilify, Strattera, hydroxyzine, Zyprexa, cogentin ?Previous Psychiatric Hospitalizations: yes x1 at [facility]: per chart review, t his was in 2018 ?Previous Suicide Attempts: denies ?History of Violence: denies ?Outpatient psychiatrist: no ? ?Social History: ?Marital Status: not married ?Children: 40- 31 yo daughter; reported little contact  ?Source of Income: [employer] ?Housing Status: states that he sleeps in his truck that he parks in front of his father's home; undomiciled ?Easy access to gun: denies ? ?Substance Use (with emphasis over the last 12 months) ?Recreational Drugs: reports using CBD. Denies other recreational drug use ?Use of Alcohol: reports drinking ~2 beers after work and will drink ~6 beers on his days off. Reports daily drinking ?Tobacco Use: denied- states that he uses nicotine patches ?Rehab History: denies ?H/O Complicated Withdrawal: denies ? ?Legal History: ?Past Charges/Incarcerations: per chart review, has received DWI in the past ?Pending charges: denies ? ?Family Psychiatric History: ?Maternal aunt's cousin has bipolar disorder per chart review ? ? ?Psychiatric Specialty Exam ? ?Presentation  ?General Appearance:Appropriate for Environment; Casual ? ?Eye Contact:Good ? ?Speech:Clear and Coherent; Normal Rate ? ?Speech Volume:Normal ? ?Handedness:Right ? ? ?  Mood and Affect  ?Mood:Euthymic ? ?Affect:Appropriate; Congruent ? ? ?Thought Process  ?Thought Processes:Coherent; Goal Directed; Linear ? ?Descriptions  of Associations:Intact ? ?Orientation:Full (Time, Place and Person) ? ?Thought Content:WDL ? Diagnosis of Schizophrenia or Schizoaffective disorder in past: Yes ? Duration of Psychotic Symptoms: Greater than six months ? Hallucinations:None ?Ideas of Reference:Paranoia (vague paranoia) ? ?Suicidal Thoughts:No ? ?Homicidal Thoughts:No ? ? ? ?Sensorium  ?Memory:Immediate Good; Recent Good; Remote Good ? ?Judgment:Fair ? ?Insight:Fair ? ? ?Executive Functions  ?Concentration:Good ? ?Attention Span:Good ? ?Recall:Good ? ?Fund of [place] ? ?Language:Good ? ? ?Psychomotor Activity  ?Psychomotor Activity:Normal ? ? ?Assets  ?Assets:Communication Skills; Desire for Improvement; Physical Health; Social Support; Transportation ? ? ?Sleep  ?Sleep:Fair ? ? ? ? ?No data recorded ? ?Physical Exam: ?Physical Exam ?Constitutional:   ?   Appearance: Normal appearance. He is normal weight.  ?HENT:  ?   Head: Normocephalic and atraumatic.  ?Eyes:  ?   Extraocular Movements: Extraocular movements intact.  ?Pulmonary:  ?   Effort: Pulmonary effort is normal.  ?Neurological:  ?   General: No focal deficit present.  ?   Mental Status: He is alert and oriented to person, place, and time.  ?Psychiatric:     ?   Attention and Perception: Attention and perception normal.     ?   Speech: Speech normal.     ?   Behavior: Behavior normal. Behavior is cooperative.     ?   Thought Content: Thought content normal.  ? ?Review of Systems  ?Constitutional:  Negative for chills and fever.  ?HENT:  Negative for hearing loss.   ?Eyes:  Negative for discharge and redness.  ?Respiratory:  Negative for cough.   ?Cardiovascular:  Negative for chest pain.  ?Gastrointestinal:  Negative for abdominal pain.  ?Musculoskeletal:  Negative for myalgias.  ?Neurological:  Negative for headaches.  ?Psychiatric/Behavioral:  Positive for substance abuse. Negative for depression, hallucinations and suicidal ideas.   ?There were no vitals taken for this visit. There  is no height or weight on file to calculate BMI. ? ?Musculoskeletal: ?Strength & Muscle Tone: within normal limits ?Gait & Station: normal ?Patient leans: N/A ? ? ?[facility] MSE Discharge Disposition for Follow up and Recommendations: ?Based on my evaluation the patient does not appear to have an emergency medical condition and can be discharged with resources and follow up care in outpatient services for Medication Management and Individual Therapy ? ?Shane Small is a 36 y.o. male with a history of schizoaffective disorder, cannabis use disorder, SIMD  and alcohol use who presents to the [facility] on 09/06/2021 for the purpose of obtaining a work note. On Sunday he had reported concerns with electricity in the air and called the fire department while at work and his boss recommended he obtain a work note while he was at work today. ? ?On my interview, patient is in NAD, alert, oriented, calm, cooperative, and attentive, with normal affect, speech, and behavior. Objectively, there is no evidence of psychosis/ mania (able to converse coherently, linear and goal directed thought, no RIS, no distractibility, not pre-occupied, no FOI, etc) nor depression to the point of suicidality (able to concentrate, affect full and reactive, speech normal r/v/t, no psychomotor retardation/agitation, etc). He denies SI/HI/AVH. He reports vague paranoia related to seeing the same cars although denies safety concerns related to this.  ? ?He reports that he has been medication complaint with zyprexa 5 mg qhs that was prescribed by NP Ajibola (prescribed #30 on 07/14/21)  although has not followed up with outpatient provider. Patient reports that he is currently taking this medication--indicating  it is unlikely that he has been taking medication as prescribed as it would have run out by now. Patient would benefit from establishing care with regular outpatient provider for continued medication management. Patient does not meet criteria for  inpatient hospitalization/IVC and is appropriate for discharge at this time.  ? ?Overall, patient appears to be at the point, in the absence of inhibiting or disinhibiting symptoms, where he can successfu

## 2021-09-06 NOTE — Discharge Instructions (Signed)
?  Please come to [facility] (this facility) during walk in hours for appointment with psychiatrist/provider for further medication management and for therapists for therapy.  ? ? Walk-Ins for medication management  are available on Monday, Wednesday, Thursday and Friday from 8am-11am.  It is first come, first -serve; it is best to arrive by 7:00 AM.  ? ? Walk-Ins for therapy are  available on Monday and Wednesday?s  8am-11am.  It is first come, first -serve; it is best to arrive by 7:00 AM.  ? ? ?When you arrive please go upstairs for your appointment. If you are unsure of where to go, inform the front desk that you are here for a walk in appointment and they will assist you with directions upstairs. ? ?Address:  ?[address], in [place], [address] ?Ph: [phone]  ? ? ? ? ? ?

## 2021-09-26 ENCOUNTER — Telehealth (HOSPITAL_COMMUNITY): Payer: Self-pay | Admitting: Emergency Medicine

## 2021-09-26 NOTE — BH Assessment (Signed)
Care Management - [place] Follow Up Discharges   Writer attempted to make contact with patient today and was unsuccessful.  The phone number listed in epic is not a working number.   Per chart review, patient was provided with outpatient resources.

## 2021-10-26 ENCOUNTER — Ambulatory Visit (HOSPITAL_COMMUNITY): Payer: No Payment, Other | Admitting: Psychiatry

## 2021-11-26 ENCOUNTER — Emergency Department (HOSPITAL_COMMUNITY)
Admission: EM | Admit: 2021-11-26 | Discharge: 2021-11-27 | Discharge status: Against Medical Advice | Payer: Self-pay | Attending: Emergency Medicine | Admitting: Emergency Medicine

## 2021-11-26 DIAGNOSIS — Z5321 Procedure and treatment not carried out due to patient leaving prior to being seen by health care provider: Secondary | ICD-10-CM | POA: Insufficient documentation

## 2021-11-26 DIAGNOSIS — T63441A Toxic effect of venom of bees, accidental (unintentional), initial encounter: Secondary | ICD-10-CM | POA: Insufficient documentation

## 2021-11-26 MED ORDER — DIPHENHYDRAMINE HCL 25 MG PO CAPS
50.0000 mg | ORAL_CAPSULE | Freq: Once | ORAL | Status: AC
  Administered 2021-11-26: 50 mg via ORAL
  Filled 2021-11-26: qty 2

## 2021-11-26 NOTE — ED Notes (Signed)
Patient states he no longer wants to be seen 

## 2021-11-26 NOTE — ED Triage Notes (Signed)
Pt c/o bee sting "I guess to my hand bc it's swollen." Pt in NAD, no respiratory symptoms, denies SHOB, airway compromise. Pt endorses itchiness, feeling "hot."

## 2021-12-26 ENCOUNTER — Emergency Department (HOSPITAL_COMMUNITY): Admission: EM | Admit: 2021-12-26 | Discharge: 2021-12-26 | Discharge status: Against Medical Advice | Payer: Self-pay

## 2021-12-26 NOTE — ED Notes (Signed)
Attempted to call pt, no answer X3.

## 2021-12-27 ENCOUNTER — Emergency Department (HOSPITAL_COMMUNITY)
Admission: EM | Admit: 2021-12-27 | Discharge: 2021-12-29 | Disposition: A | Discharge status: Home / Self Care | Payer: Self-pay | Attending: Emergency Medicine | Admitting: Emergency Medicine

## 2021-12-27 ENCOUNTER — Encounter (HOSPITAL_COMMUNITY): Payer: Self-pay

## 2021-12-27 DIAGNOSIS — F200 Paranoid schizophrenia: Secondary | ICD-10-CM | POA: Insufficient documentation

## 2021-12-27 DIAGNOSIS — J45909 Unspecified asthma, uncomplicated: Secondary | ICD-10-CM | POA: Insufficient documentation

## 2021-12-27 DIAGNOSIS — Z20822 Contact with and (suspected) exposure to covid-19: Secondary | ICD-10-CM | POA: Insufficient documentation

## 2021-12-27 DIAGNOSIS — Z79899 Other long term (current) drug therapy: Secondary | ICD-10-CM | POA: Insufficient documentation

## 2021-12-27 DIAGNOSIS — R456 Violent behavior: Secondary | ICD-10-CM | POA: Insufficient documentation

## 2021-12-27 DIAGNOSIS — Z7951 Long term (current) use of inhaled steroids: Secondary | ICD-10-CM | POA: Insufficient documentation

## 2021-12-27 DIAGNOSIS — F1210 Cannabis abuse, uncomplicated: Secondary | ICD-10-CM | POA: Insufficient documentation

## 2021-12-27 HISTORY — DX: Paranoid schizophrenia: F20.0

## 2021-12-27 HISTORY — DX: Anxiety disorder, unspecified: F41.9

## 2021-12-27 HISTORY — DX: Depression, unspecified: F32.A

## 2021-12-27 NOTE — ED Triage Notes (Addendum)
Pt from [place] BIB [facility] for paranoid and aggressive behavior. [employer] was call out d/t pt thinking there was someone shooting at him, LEO confirm no shooter, pt appeared delusional and having hallucinations. LEO stated pt became aggressive and combative with them and then with EMS on their arrival Pt is in restraints by EMS and handcuffs. Pt received 5 mg Versed IM per EMS, VSS, hx of paranoid schizophrenia. Restraints and handcuffs removed upon arrival to treatment room, pt calm and cooperative at this time.  Pt reports one beer (PBR) and used CBD tonight. A&O x4, follow commands.

## 2021-12-27 NOTE — ED Provider Notes (Signed)
[facility]-EMERGENCY DEPT Provider Note   CSN: [id number] Arrival date & time: 12/27/21  2331     History  Chief Complaint  Patient presents with   Aggressive Behavior    Shane Small is a 36 y.o. male.  The history is provided by the patient and medical records.   36 year old male with history of schizophrenia, asthma, anxiety, cannabis use disorder, presenting to the ED for psychiatric evaluation.  [employer] called out to the local [place] [employer] station as patient was acting strangely and complaining that there were active shooters in the area.  There were no shooters or other violent acts taking place.  Patient was finding with [employer] and then with EMS, he was given 5 mg versed and placed in restraints.  Patient is calm on arrival to ED.  Home Medications Prior to Admission medications   Medication Sig Start Date End Date Taking? Authorizing Provider  albuterol (VENTOLIN HFA) 108 (90 Base) MCG/ACT inhaler Inhale 1-2 puffs into the lungs every 6 (six) hours as needed for wheezing or shortness of breath. 05/04/20   Mardella Layman, MD  ARIPiprazole (ABILIFY) 5 MG tablet Take by mouth. 09/23/19   [provider]  ARIPiprazole ER (ABILIFY MAINTENA) 400 MG SRER injection Inject 2 mLs (400 mg total) into the muscle every 28 (twenty-eight) days. (Due 10-11-16): For mood control 04/08/20   Toy Cookey E, NP  benztropine (COGENTIN) 0.5 MG tablet Take 1 tablet (0.5 mg total) by mouth 2 (two) times daily. For prevention of drug induced tremors. 07/14/21   Ajibola, Ene A, NP  busPIRone (BUSPAR) 10 MG tablet Take 1 tablet (10 mg total) by mouth 3 (three) times daily. 04/08/20   Shanna Cisco, NP  OLANZapine (ZYPREXA) 5 MG tablet Take 1 tablet (5 mg total) by mouth at bedtime. 07/14/21   Ajibola, Ene A, NP  predniSONE (DELTASONE) 20 MG tablet Take 2 tablets (40 mg total) by mouth daily. 05/04/20   Mardella Layman, MD  traZODone (DESYREL) 50 MG tablet Take 1 tablet (50 mg  total) by mouth at bedtime as needed for sleep. Patient not taking: Reported on 08/11/2019 09/14/16   Armandina Stammer I, NP  Valbenazine Tosylate [facility]) 40 MG CAPS Take 1 capsule (40 mg total) by mouth daily. 04/08/20   Shanna Cisco, NP  atomoxetine (STRATTERA) 10 MG capsule Take 10 mg by mouth daily.  10/31/19  [provider]  omeprazole (PRILOSEC) 20 MG capsule Take 1 capsule (20 mg total) by mouth 2 (two) times daily before a meal. 09/19/19 10/31/19  Darr, Gerilyn Pilgrim, PA-C      Allergies    Shellfish allergy    Review of Systems   Review of Systems  Psychiatric/Behavioral:  Positive for hallucinations.   All other systems reviewed and are negative.   Physical Exam Updated Vital Signs There were no vitals taken for this visit.  Physical Exam Vitals and nursing note reviewed.  Constitutional:      Appearance: He is well-developed.     Comments: Drowsy on arrival, given versed; restrained to EMS stretcher  HENT:     Head: Normocephalic and atraumatic.  Eyes:     Conjunctiva/sclera: Conjunctivae normal.     Pupils: Pupils are equal, round, and reactive to light.  Cardiovascular:     Rate and Rhythm: Normal rate and regular rhythm.     Heart sounds: Normal heart sounds.  Pulmonary:     Effort: Pulmonary effort is normal.     Breath sounds: Normal  breath sounds.  Abdominal:     General: Bowel sounds are normal.     Palpations: Abdomen is soft.  Musculoskeletal:        General: Normal range of motion.     Cervical back: Normal range of motion.  Skin:    General: Skin is warm and dry.  Neurological:     Mental Status: He is alert and oriented to person, place, and time.     Comments: Drowsy after versed but able to answer questions and follow commands     ED Results / Procedures / Treatments   Labs (all labs ordered are listed, but only abnormal results are displayed) Labs Reviewed  CBC WITH DIFFERENTIAL/PLATELET - Abnormal; Notable for the following components:       Result Value   RBC 3.48 (*)    Hemoglobin 11.7 (*)    HCT 34.9 (*)    MCV 100.3 (*)    Lymphs Abs 0.6 (*)    All other components within normal limits  COMPREHENSIVE METABOLIC PANEL - Abnormal; Notable for the following components:   Creatinine, Ser 1.26 (*)    All other components within normal limits  SALICYLATE LEVEL - Abnormal; Notable for the following components:   Salicylate Lvl <7.0 (*)    All other components within normal limits  ACETAMINOPHEN LEVEL - Abnormal; Notable for the following components:   Acetaminophen (Tylenol), Serum <10 (*)    All other components within normal limits  RESP PANEL BY RT-PCR (FLU A&B, COVID) [id number]  ETHANOL  RAPID URINE DRUG SCREEN, HOSP PERFORMED    EKG None  Radiology No results found.  Procedures Procedures    Medications Ordered in ED Medications - No data to display  ED Course/ Medical Decision Making/ A&P                           Medical Decision Making Amount and/or Complexity of Data Reviewed Labs: ordered. ECG/medicine tests: ordered and independent interpretation performed.   36 year old male presenting to the ED for psychiatric evaluation.  Was picked up from a local [employer] gas station as he was having paranoid delusions that people were shooting at him.  Per chart review, seems to have a history of similar behavior.  He was fighting with [employer] and EMS, given 5mg  Versed and now calm/cooperative. No apparent in juries noted on exam.  VSS.  Labs obtained--- no profound anemia or electrolyte derangement.  Negative tylenol, salicylate, and ethanol levels. UDS pending.  Medically cleared.  Will get TTS evaluation.  Final Clinical Impression(s) / ED Diagnoses Final diagnoses:  Paranoid schizophrenia, chronic condition [facility])    Rx / DC Orders ED Discharge Orders     None         [facility], PA-C 12/28/21 0550    12/30/21, MD 12/29/21 [phone]

## 2021-12-28 LAB — CBC WITH DIFFERENTIAL/PLATELET
Abs Immature Granulocytes: 0.02 10*3/uL (ref 0.00–0.07)
Basophils Absolute: 0 10*3/uL (ref 0.0–0.1)
Basophils Relative: 0 %
Eosinophils Absolute: 0 10*3/uL (ref 0.0–0.5)
Eosinophils Relative: 1 %
HCT: 34.9 % — ABNORMAL LOW (ref 39.0–52.0)
Hemoglobin: 11.7 g/dL — ABNORMAL LOW (ref 13.0–17.0)
Immature Granulocytes: 0 %
Lymphocytes Relative: 10 %
Lymphs Abs: 0.6 10*3/uL — ABNORMAL LOW (ref 0.7–4.0)
MCH: 33.6 pg (ref 26.0–34.0)
MCHC: 33.5 g/dL (ref 30.0–36.0)
MCV: 100.3 fL — ABNORMAL HIGH (ref 80.0–100.0)
Monocytes Absolute: 0.3 10*3/uL (ref 0.1–1.0)
Monocytes Relative: 5 %
Neutro Abs: 4.8 10*3/uL (ref 1.7–7.7)
Neutrophils Relative %: 84 %
Platelets: 242 10*3/uL (ref 150–400)
RBC: 3.48 MIL/uL — ABNORMAL LOW (ref 4.22–5.81)
RDW: 12.2 % (ref 11.5–15.5)
WBC: 5.8 10*3/uL (ref 4.0–10.5)
nRBC: 0 % (ref 0.0–0.2)

## 2021-12-28 LAB — COMPREHENSIVE METABOLIC PANEL
ALT: 21 U/L (ref 0–44)
AST: 29 U/L (ref 15–41)
Albumin: 3.7 g/dL (ref 3.5–5.0)
Alkaline Phosphatase: 58 U/L (ref 38–126)
Anion gap: 6 (ref 5–15)
BUN: 15 mg/dL (ref 6–20)
CO2: 24 mmol/L (ref 22–32)
Calcium: 8.9 mg/dL (ref 8.9–10.3)
Chloride: 109 mmol/L (ref 98–111)
Creatinine, Ser: 1.26 mg/dL — ABNORMAL HIGH (ref 0.61–1.24)
GFR, Estimated: >60 mL/min (ref >60)
Glucose, Bld: 89 mg/dL (ref 70–99)
Potassium: 3.8 mmol/L (ref 3.5–5.1)
Sodium: 139 mmol/L (ref 135–145)
Total Bilirubin: 0.7 mg/dL (ref 0.3–1.2)
Total Protein: 6.6 g/dL (ref 6.5–8.1)

## 2021-12-28 LAB — RESP PANEL BY RT-PCR (FLU A&B, COVID) ARPGX2
Influenza A by PCR: NEGATIVE
Influenza B by PCR: NEGATIVE
SARS Coronavirus 2 by RT PCR: NEGATIVE

## 2021-12-28 LAB — ACETAMINOPHEN LEVEL: Acetaminophen (Tylenol), Serum: <10 ug/mL — ABNORMAL LOW (ref 10–30)

## 2021-12-28 LAB — SALICYLATE LEVEL: Salicylate Lvl: <7 mg/dL — ABNORMAL LOW (ref 7.0–30.0)

## 2021-12-28 LAB — ETHANOL: Alcohol, Ethyl (B): <10 mg/dL (ref <10)

## 2021-12-28 MED ORDER — OLANZAPINE 5 MG PO TABS
5.0000 mg | ORAL_TABLET | Freq: Every day | ORAL | Status: DC
  Administered 2021-12-28: 5 mg via ORAL
  Filled 2021-12-28: qty 1

## 2021-12-28 MED ORDER — LORAZEPAM 1 MG PO TABS: 1.0000 mg | ORAL_TABLET | ORAL | Status: DC | PRN

## 2021-12-28 MED ORDER — DIPHENHYDRAMINE HCL 25 MG PO CAPS
25.0000 mg | ORAL_CAPSULE | Freq: Four times a day (QID) | ORAL | Status: DC | PRN
  Administered 2021-12-28: 25 mg via ORAL
  Filled 2021-12-28: qty 1

## 2021-12-28 MED ORDER — ZIPRASIDONE MESYLATE 20 MG IM SOLR: 20.0000 mg | Freq: Two times a day (BID) | INTRAMUSCULAR | Status: DC | PRN

## 2021-12-28 NOTE — ED Notes (Signed)
Security wanded pt ?

## 2021-12-28 NOTE — ED Notes (Signed)
PT aware of urine sample. Unable to give at this time 

## 2021-12-28 NOTE — Consult Note (Signed)
BH ED ASSESSMENT   Reason for Consult:  Paranoia Referring Physician:  Sharilyn Sites, PA-C Patient Identification: Shane Small MRN:  [id number] ED Chief Complaint: Schizophrenia, paranoid [facility])  Diagnosis:  Principal Problem:   Schizophrenia, paranoid [facility])   ED Assessment Time Calculation: Start Time: 0945 Stop Time: 1015 Total Time in Minutes (Assessment Completion): 30   Subjective:   Shane Small is a 36 y.o. male patient with a history of schizophrenia-paranoid type who presents to [facility] with law enforcement after he was found at a local [employer] gas station acting strangely and complaining that there were active shooters in the area.  Per [employer] there were no active shooters in the area.  Per notes, the patient was agitated with [employer] and EMS and was given 5 mg Versed and placed in restraints.  HPI:   Patient reports that he has a history of schizophrenia.  He states that he was diagnosed approximately 5 years ago.  He states that he has been feeling like someone is following him and shooting at him.  He reports that he can hear the voices of someone following him.  He states that he feels like cars are following him.  He denies visual hallucinations.  No indication that he is responding to internal stimuli.  He denies feeling paranoid at this time.  He denies suicidal ideations.  He reports homicidal ideations towards no one specific.  He denies any homicidal intent or plan.  Patient reports that he was living with his dad but is now living in his car as of 2 days ago.  He states that he is no longer working.  He reports that he uses nicotine patches daily.  Drinks alcohol almost daily.  1 to 212 ounce beers.  Last alcoholic drink was yesterday.  Reports that he no longer uses CBD every once in a while.  Denies use of cocaine, heroin, methamphetamine, and other illicit substances.  UDS is pending collection.  BAL less than 10 on arrival to the ED.  Patient reports that he has taken  Abilify in the past.  He states that he has also taken other antipsychotics.  He reports they all make him feel "like a zombie."  States that he received Abilify Maintena 1 time.  States that he does not have a current psychiatric provider.  On evaluation, patient is alert and oriented x 4.  He is pleasant and cooperative.  He is neatly groomed.  Eye contact is good.  Speech is clear and coherent.  Reports mood is depressed/anxious.  Affect is congruent with mood.  Patient reports that he has been feeling like someone is following him.  States that last night he felt like someone was shooting at him.  He denies current auditory and visual hallucinations.  No indication that he is responding to internal stimuli.  He denies suicidal ideations.  He reports homicidal ideations towards no one specific.  Denies any homicidal intent or plan.  Past Psychiatric History:  Schizophrenia, paranoid type.  On chart review it is noted that the patient was seen at [facility] as a walk-in on 07/07/2021.  He was seen at [facility] on 07/14/2021 and 09/06/2021 with similar presentations.  Patient was discharged to his time and provided with resources.  He was prescribed Zyprexa during one of the visits and reported that it was helpful.  It appears that his last inpatient psychiatric admission was at [facility] 08/2016.  Risk to Self or Others: Is the patient at risk to self?  No Has the patient been a risk to self in the past 6 months? No Has the patient been a risk to self within the distant past? Yes Is the patient a risk to others? Yes Has the patient been a risk to others in the past 6 months? No Has the patient been a risk to others within the distant past? Yes  [place] Scale:  Flowsheet Row ED from 12/27/2021 in [facility] DE[facility] ED from 11/26/2021 in [facility] EMERGENCY DEPARTMENT ED from 11/30/2020 in [facility]GENCY DEPARTMENT  C-SSRS RISK CATEGORY No Risk No Risk No  Risk       AIMS:  , , ,  ,   ASAM: ASAM Multidimensional Assessment Summary DImension 1:  Acute Intoxication and/or Withdrawal Potential Severity Rating: Mild Dimension 2:  Biomedical Conditions and Complications Severity Rating: Mild Dimension 3:  Emotional, behavioral or cognitive (EBC) conditions and complications severity rating: MIld Dimension 4:  Readiness to Change Severity Rating: Mild Dimension 5:  Relapse, continued use, or continued problem potential severity rating: Moderate Dimension 6:  Recovery/living environment severity rating: Moderate  Substance Abuse:  Alcohol / Drug Use History of alcohol / drug use?: Yes Withdrawal Symptoms: None  Past Medical History:  Past Medical History:  Diagnosis Date   Anxiety    Asthma    Daily headache    Depression    Paranoid schizophrenia (HCC)    Schizophrenia (HCC)    Stroke (HCC)     Past Surgical History:  Procedure Laterality Date   NO PAST SURGERIES     Family History:  Family History  Problem Relation Age of Onset   Asthma Mother    Diabetes Father    Hypertension Father    Colon cancer Father 2   Family Psychiatric History: Maternal aunt's cousin has bipolar disorder per chart review  Social History:  Social History   Substance and Sexual Activity  Alcohol Use Yes   Alcohol/week: 56.0 standard drinks of alcohol   Types: 28 Cans of beer, 28 Shots of liquor per week   Comment: occasionally     Social History   Substance and Sexual Activity  Drug Use Yes   Comment: CBD    Social History   Socioeconomic History   Marital status: Single    Spouse name: Not on file   Number of children: 1   Years of education: Not on file   Highest education level: Not on file  Occupational History   Occupation: Location manager  Tobacco Use   Smoking status: Every Day    Types: Cigarettes   Smokeless tobacco: Never  Vaping Use   Vaping Use: Every day  Substance and Sexual Activity   Alcohol use: Yes     Alcohol/week: 56.0 standard drinks of alcohol    Types: 28 Cans of beer, 28 Shots of liquor per week    Comment: occasionally   Drug use: Yes    Comment: CBD   Sexual activity: Not on file  Other Topics Concern   Not on file  Social History Narrative   Not on file   Social Determinants of Health   Financial Resource Strain: Not on file  Food Insecurity: Not on file  Transportation Needs: Not on file  Physical Activity: Not on file  Stress: Not on file  Social Connections: Not on file   Additional Social History:    Allergies:   Allergies  Allergen Reactions   Shellfish Allergy Other (See  Comments)    Unknown reaction     Labs:  Results for orders placed or performed during the hospital encounter of 12/27/21 (from the past 48 hour(s))  CBC with Differential     Status: Abnormal   Collection Time: 12/28/21 12:39 AM  Result Value Ref Range   WBC 5.8 4.0 - 10.5 K/uL   RBC 3.48 (L) 4.22 - 5.81 MIL/uL   Hemoglobin 11.7 (L) 13.0 - 17.0 g/dL   HCT 16.134.9 (L) 09.639.0 - 04.552.0 %   MCV 100.3 (H) 80.0 - 100.0 fL   MCH 33.6 26.0 - 34.0 pg   MCHC 33.5 30.0 - 36.0 g/dL   RDW 40.912.2 81.111.5 - 91.415.5 %   Platelets 242 150 - 400 K/uL   nRBC 0.0 0.0 - 0.2 %   Neutrophils Relative % 84 %   Neutro Abs 4.8 1.7 - 7.7 K/uL   Lymphocytes Relative 10 %   Lymphs Abs 0.6 (L) 0.7 - 4.0 K/uL   Monocytes Relative 5 %   Monocytes Absolute 0.3 0.1 - 1.0 K/uL   Eosinophils Relative 1 %   Eosinophils Absolute 0.0 0.0 - 0.5 K/uL   Basophils Relative 0 %   Basophils Absolute 0.0 0.0 - 0.1 K/uL   Immature Granulocytes 0 %   Abs Immature Granulocytes 0.02 0.00 - 0.07 K/uL    Comment: Performed at [facility], [address] [place], [place] [address]  Comprehensive metabolic panel     Status: Abnormal   Collection Time: 12/28/21 12:39 AM  Result Value Ref Range   Sodium 139 135 - 145 mmol/L   Potassium 3.8 3.5 - 5.1 mmol/L   Chloride 109 98 - 111 mmol/L   CO2 24 22 - 32 mmol/L    Glucose, Bld 89 70 - 99 mg/dL    Comment: Glucose reference range applies only to samples taken after fasting for at least 8 hours.   BUN 15 6 - 20 mg/dL   Creatinine, Ser 6.211.26 (H) 0.61 - 1.24 mg/dL   Calcium 8.9 8.9 - 30.810.3 mg/dL   Total Protein 6.6 6.5 - 8.1 g/dL   Albumin 3.7 3.5 - 5.0 g/dL   AST 29 15 - 41 U/L   ALT 21 0 - 44 U/L   Alkaline Phosphatase 58 38 - 126 U/L   Total Bilirubin 0.7 0.3 - 1.2 mg/dL   GFR, Estimated >65>60 >78>60 mL/min    Comment: (NOTE) Calculated using the CKD-EPI Creatinine Equation (2021)    Anion gap 6 5 - 15    Comment: Performed at [facility], [address] [place], [place] [address]  Ethanol     Status: None   Collection Time: 12/28/21 12:39 AM  Result Value Ref Range   Alcohol, Ethyl (B) <10 <10 mg/dL    Comment: (NOTE) Lowest detectable limit for serum alcohol is 10 mg/dL.  For medical purposes only. Performed at [facility], [address] [place], [place] [address]   Salicylate level     Status: Abnormal   Collection Time: 12/28/21 12:39 AM  Result Value Ref Range   Salicylate Lvl <7.0 (L) 7.0 - 30.0 mg/dL    Comment: Performed at [facility], [address] [place], [place] [address]  Acetaminophen level     Status: Abnormal   Collection Time: 12/28/21 12:39 AM  Result Value Ref Range   Acetaminophen (Tylenol), Serum <10 (L) 10 - 30 ug/mL    Comment: (NOTE) Therapeutic concentrations vary significantly.  A range of 10-30 ug/mL  may be an effective concentration for many patients. However, some  are best treated at concentrations outside of this range. Acetaminophen concentrations >150 ug/mL at 4 hours after ingestion  and >50 ug/mL at 12 hours after ingestion are often associated with  toxic reactions.  Performed at [facility], [address], [place], [place] [address]   Resp Panel by RT-PCR (Flu A&B, Covid) Anterior Nasal Swab     Status: None    Collection Time: 12/28/21  4:06 AM   Specimen: Anterior Nasal Swab  Result Value Ref Range   SARS Coronavirus 2 by RT PCR NEGATIVE NEGATIVE    Comment: (NOTE) SARS-CoV-2 target nucleic acids are NOT DETECTED.  The SARS-CoV-2 RNA is generally detectable in upper respiratory specimens during the acute phase of infection. The lowest concentration of SARS-CoV-2 viral copies this assay can detect is 138 copies/mL. A negative result does not preclude SARS-Cov-2 infection and should not be used as the sole basis for treatment or other patient management decisions. A negative result may occur with  improper specimen collection/handling, submission of specimen other than nasopharyngeal swab, presence of viral mutation(s) within the areas targeted by this assay, and inadequate number of viral copies(<138 copies/mL). A negative result must be combined with clinical observations, patient history, and epidemiological information. The expected result is Negative.  Fact Sheet for Patients:  BloggerCourse.com  Fact Sheet for Healthcare Providers:  SeriousBroker.it  This test is no t yet approved or cleared by the [place] FDA and  has been authorized for detection and/or diagnosis of SARS-CoV-2 by FDA under an Emergency Use Authorization (EUA). This EUA will remain  in effect (meaning this test can be used) for the duration of the COVID-19 declaration under Section 564(b)(1) of the Act, 21 U.S.C.section 360bbb-3(b)(1), unless the authorization is terminated  or revoked sooner.       Influenza A by PCR NEGATIVE NEGATIVE   Influenza B by PCR NEGATIVE NEGATIVE    Comment: (NOTE) The Xpert Xpress SARS-CoV-2/FLU/RSV plus assay is intended as an aid in the diagnosis of influenza from Nasopharyngeal swab specimens and should not be used as a sole basis for treatment. Nasal washings and aspirates are unacceptable for Xpert Xpress  SARS-CoV-2/FLU/RSV testing.  Fact Sheet for Patients: BloggerCourse.com  Fact Sheet for Healthcare Providers: SeriousBroker.it  This test is not yet approved or cleared by the [place] FDA and has been authorized for detection and/or diagnosis of SARS-CoV-2 by FDA under an Emergency Use Authorization (EUA). This EUA will remain in effect (meaning this test can be used) for the duration of the COVID-19 declaration under Section 564(b)(1) of the Act, 21 U.S.C. section 360bbb-3(b)(1), unless the authorization is terminated or revoked.  Performed at [facility], [address] [place], [place] [address]     Current Facility-Administered Medications  Medication Dose Route Frequency Provider Last Rate Last Admin   ARIPiprazole ER (ABILIFY MAINTENA) 400 MG prefilled syringe 400 mg  400 mg Intramuscular Once Shanna Cisco, NP       Current Outpatient Medications  Medication Sig Dispense Refill   acetaminophen (TYLENOL) 325 MG tablet Take 975 mg by mouth every 6 (six) hours as needed for mild pain or moderate pain.     diphenhydrAMINE (BENADRYL) 25 MG tablet Take 25-50 mg by mouth every 8 (eight) hours as needed for itching, allergies or sleep.     Polyethyl Glycol-Propyl Glycol (SYSTANE OP) Apply 2 drops to eye in  the morning and at bedtime.     OLANZapine (ZYPREXA) 5 MG tablet Take 1 tablet (5 mg total) by mouth at bedtime. (Patient not taking: Reported on 12/28/2021) 30 tablet 0    Musculoskeletal: Strength & Muscle Tone: within normal limits Gait & Station: normal Patient leans: N/A   Psychiatric Specialty Exam: Presentation  General Appearance: Appropriate for Environment; Casual  Eye Contact:Good  Speech:Clear and Coherent; Normal Rate  Speech Volume:Normal  Handedness:Right   Mood and Affect  Mood:Depressed; Anxious  Affect:Congruent   Thought Process  Thought Processes:Coherent;  Linear  Descriptions of Associations:Intact  Orientation:Full (Time, Place and Person)  Thought Content:Paranoid Ideation  History of Schizophrenia/Schizoaffective disorder:Yes  Duration of Psychotic Symptoms:Greater than six months  Hallucinations:Hallucinations: Auditory Description of Auditory Hallucinations: hears voices of people following him around  Ideas of Reference:Paranoia  Suicidal Thoughts:Suicidal Thoughts: No  Homicidal Thoughts:Homicidal Thoughts: No   Sensorium  Memory:Immediate Good; Recent Good; Remote Good  Judgment:Intact  Insight:Present   Executive Functions  Concentration:Good  Attention Span:Good  Recall:Good  Fund of Knowledge:Good  Language:Good   Psychomotor Activity  Psychomotor Activity:Psychomotor Activity: Normal   Assets  Assets:Communication Skills; Desire for Improvement; Physical Health    Sleep  Sleep:Sleep: Fair   Physical Exam: Physical Exam Constitutional:      General: He is not in acute distress.    Appearance: He is not ill-appearing, toxic-appearing or diaphoretic.  Eyes:     General:        Right eye: No discharge.        Left eye: No discharge.  Cardiovascular:     Rate and Rhythm: Normal rate.  Pulmonary:     Effort: Pulmonary effort is normal. No respiratory distress.  Musculoskeletal:        General: Normal range of motion.     Cervical back: Normal range of motion.  Neurological:     Mental Status: He is alert and oriented to person, place, and time.  Psychiatric:        Mood and Affect: Mood is anxious and depressed.        Behavior: Behavior is cooperative.        Thought Content: Thought content is paranoid and delusional. Thought content does not include homicidal or suicidal ideation.    Review of Systems  Respiratory:  Negative for cough and shortness of breath.   Cardiovascular:  Negative for chest pain.  Gastrointestinal:  Negative for diarrhea, nausea and vomiting.   Psychiatric/Behavioral:  Positive for depression, hallucinations and substance abuse. Negative for memory loss and suicidal ideas. The patient is nervous/anxious and has insomnia.    Blood pressure 104/64, pulse 64, temperature 98 F (36.7 C), temperature source Oral, resp. rate 18, height 5\' 11"  (1.803 m), weight 74.8 kg, SpO2 97 %. Body mass index is 23.01 kg/m.  Medical Decision Making: Patient has a history of paranoid schizophrenia.  Reports that he feels like someone has been following him and shooting at him.  Reports he can hear voices of people following him.  Denies current auditory or visual hallucinations.  BAL less than 10.  UDS pending collection.  Reviewed EKG-QTc 435 ms  Problem 1: Paranoid schizophrenia Restart olanzapine 5 mg nightly for schizophrenia  Disposition: Recommend psychiatric Inpatient admission when medically cleared.  , NP 12/28/2021 12:56 PM

## 2021-12-28 NOTE — ED Notes (Signed)
Pt changed into burgundy scrubs. Pt has 2 personal belonging's bag. 1 bag has his shirt, jeans, belt, and phone (labeled w/ sticker). 2nd bag has his shoes. Security notified to wand.

## 2021-12-28 NOTE — BH Assessment (Signed)
TTS clinician attempted to complete assessment. Per Caitlyn, RN, patient is asleep and unable to arouse. TTS will attempt at later time.

## 2021-12-28 NOTE — ED Notes (Signed)
Pt has a bag of personal items that were given to tech by EMS. The bag has been labeled and placed behind the nurses station in the cabinet. Pocket knife has been given to security.

## 2021-12-29 LAB — RAPID URINE DRUG SCREEN, HOSP PERFORMED
Amphetamines: NOT DETECTED
Barbiturates: NOT DETECTED
Benzodiazepines: POSITIVE — AB
Cocaine: NOT DETECTED
Opiates: NOT DETECTED
Tetrahydrocannabinol: POSITIVE — AB

## 2021-12-29 MED ORDER — OLANZAPINE 5 MG PO TABS: 5.0000 mg | ORAL_TABLET | Freq: Every day | ORAL | 0 refills | Status: DC

## 2021-12-29 NOTE — Discharge Summary (Addendum)
[facility] Psych ED Discharge  12/29/2021 1:02 PM Shane Small  MRN:  [id number]  Principal Problem: Schizophrenia, paranoid [facility]) Discharge Diagnoses: Principal Problem:   Schizophrenia, paranoid ([place])  Clinical Impression:  Final diagnoses:  Paranoid schizophrenia, chronic condition ([place])   Subjective:  Shane Small is a 36 y.o. male patient with a history of schizophrenia-paranoid type who presented to [facility] on 12/27/21 with law enforcement after he was found at a local [place] station acting strangely and complaining that there were active shooters in the area.   On evaluation today, the patient requests discharge.  He is alert and oriented x 4.  Eye contact is good.  He is neatly groomed.  Speech is clear and coherent.  He reports his mood is euthymic.  Affect is congruent with mood.  Thought processes coherent.  Thought content is logical.  He denies feelings of paranoia today.  He denies auditory and visual hallucinations.  No indication that he is responding to internal stimuli.  He denies suicidal ideations.  He denies homicidal ideations.  Discussed services provided at [facility], which the patient is familiar with.   During this admission in the emergency department, the patient was restarted on olanzapine 5 mg nightly.  He has tolerated well.  Denies, Nausea, vomiting, diarrhea.  Discussed benefits of inpatient psychiatric admission and medication management.  Patient continues to decline voluntary admission.  Patient does not meet criteria for involuntary commitment.   ED Assessment Time Calculation: Start Time: 1200 Stop Time: 1220 Total Time in Minutes (Assessment Completion): 20   Past Psychiatric History:  Schizophrenia, paranoid type.  On chart review it is noted that the patient was seen at [facility] as a walk-in on 07/07/2021.  He was seen at [facility] on 07/14/2021 and 09/06/2021 with similar presentations.  Patient was discharged to his time and  provided with resources.  He was prescribed Zyprexa during one of the visits and reported that it was helpful.  It appears that his last inpatient psychiatric admission was at [facility] 08/2016.  Past Medical History:  Past Medical History:  Diagnosis Date   Anxiety    Asthma    Daily headache    Depression    Paranoid schizophrenia ([place])    Schizophrenia ([place])    Stroke ([place])     Past Surgical History:  Procedure Laterality Date   NO PAST SURGERIES     Family History:  Family History  Problem Relation Age of Onset   Asthma Mother    Diabetes Father    Hypertension Father    Colon cancer Father 9    Social History:  Social History   Substance and Sexual Activity  Alcohol Use Yes   Alcohol/week: 56.0 standard drinks of alcohol   Types: 28 Cans of beer, 28 Shots of liquor per week   Comment: occasionally     Social History   Substance and Sexual Activity  Drug Use Yes   Comment: CBD    Social History   Socioeconomic History   Marital status: Single    Spouse name: Not on file   Number of children: 1   Years of education: Not on file   Highest education level: Not on file  Occupational History   Occupation: Glass blower/designer  Tobacco Use   Smoking status: Every Day    Types: Cigarettes   Smokeless tobacco: Never  Vaping Use   Vaping Use: Every day  Substance and Sexual Activity   Alcohol use: Yes  Alcohol/week: 56.0 standard drinks of alcohol    Types: 28 Cans of beer, 28 Shots of liquor per week    Comment: occasionally   Drug use: Yes    Comment: CBD   Sexual activity: Not on file  Other Topics Concern   Not on file  Social History Narrative   Not on file   Social Determinants of Health   Financial Resource Strain: Not on file  Food Insecurity: Not on file  Transportation Needs: Not on file  Physical Activity: Not on file  Stress: Not on file  Social Connections: Not on file    Tobacco Cessation:  A prescription for an FDA-approved tobacco  cessation medication was offered at discharge and the patient refused  Current Medications: Current Facility-Administered Medications  Medication Dose Route Frequency Provider Last Rate Last Admin   ARIPiprazole ER (ABILIFY MAINTENA) 400 MG prefilled syringe 400 mg  400 mg Intramuscular Once Toy Cookey E, NP       diphenhydrAMINE (BENADRYL) capsule 25 mg  25 mg Oral Q6H PRN Terrilee Files, MD   25 mg at 12/28/21 2013   ziprasidone (GEODON) injection 20 mg  20 mg Intramuscular Q12H PRN Jackelyn Poling, NP       And   LORazepam (ATIVAN) tablet 1 mg  1 mg Oral PRN Jackelyn Poling, NP       OLANZapine (ZYPREXA) tablet 5 mg  5 mg Oral QHS Nira Conn A, NP   5 mg at 12/28/21 2013   Current Outpatient Medications  Medication Sig Dispense Refill   acetaminophen (TYLENOL) 325 MG tablet Take 975 mg by mouth every 6 (six) hours as needed for mild pain or moderate pain.     diphenhydrAMINE (BENADRYL) 25 MG tablet Take 25-50 mg by mouth every 8 (eight) hours as needed for itching, allergies or sleep.     Polyethyl Glycol-Propyl Glycol (SYSTANE OP) Apply 2 drops to eye in the morning and at bedtime.     OLANZapine (ZYPREXA) 5 MG tablet Take 1 tablet (5 mg total) by mouth at bedtime. 30 tablet 0   PTA Medications: (Not in a hospital admission)   [place] Scale:  Flowsheet Row ED from 12/27/2021 in [facility] DEPT ED from 11/26/2021 in [facility] EMERGENCY DEPARTMENT ED from 11/30/2020 in [facility] EM[facility]GENCY DEPARTMENT  C-SSRS RISK CATEGORY No Risk No Risk No Risk       Musculoskeletal: Strength & Muscle Tone: within normal limits Gait & Station: normal Patient leans: N/A  Psychiatric Specialty Exam: Presentation  General Appearance: Appropriate for Environment; Casual  Eye Contact:Good  Speech:Clear and Coherent; Normal Rate  Speech Volume:Normal  Handedness:Right   Mood and Affect   Mood:Euthymic  Affect:Congruent   Thought Process  Thought Processes:Coherent; Linear  Descriptions of Associations:Intact  Orientation:Full (Time, Place and Person)  Thought Content:Logical  History of Schizophrenia/Schizoaffective disorder:Yes  Duration of Psychotic Symptoms:Greater than six months  Hallucinations:Hallucinations: None Description of Auditory Hallucinations: hears voices of people following him around  Ideas of Reference:None  Suicidal Thoughts:Suicidal Thoughts: No  Homicidal Thoughts:Homicidal Thoughts: No   Sensorium  Memory:Immediate Good; Recent Good; Remote Good  Judgment:Fair  Insight:Fair   Executive Functions  Concentration:Good  Attention Span:Good  Recall:Good  Fund of Knowledge:Good  Language:Good   Psychomotor Activity  Psychomotor Activity:Psychomotor Activity: Normal   Assets  Assets:Communication Skills; Desire for Improvement; Physical Health   Sleep  Sleep:Sleep: Fair    Physical Exam: Physical Exam Constitutional:  General: He is not in acute distress.    Appearance: He is not ill-appearing, toxic-appearing or diaphoretic.  HENT:     Right Ear: External ear normal.     Left Ear: External ear normal.  Eyes:     General:        Right eye: No discharge.        Left eye: No discharge.  Cardiovascular:     Rate and Rhythm: Normal rate.  Pulmonary:     Effort: Pulmonary effort is normal. No respiratory distress.  Musculoskeletal:        General: Normal range of motion.     Cervical back: Normal range of motion.  Neurological:     Mental Status: He is alert and oriented to person, place, and time.  Psychiatric:        Thought Content: Thought content is not paranoid or delusional. Thought content does not include homicidal or suicidal ideation.    Review of Systems  Constitutional:  Negative for chills, diaphoresis, fever, malaise/fatigue and weight loss.  Cardiovascular:  Negative for chest  pain and palpitations.  Gastrointestinal:  Negative for diarrhea, nausea and vomiting.  Neurological:  Negative for dizziness and seizures.  Psychiatric/Behavioral:  Positive for substance abuse. Negative for depression, hallucinations, memory loss and suicidal ideas. The patient has insomnia. The patient is not nervous/anxious.    Blood pressure 115/76, pulse 63, temperature 97.7 F (36.5 C), temperature source Oral, resp. rate 18, height 5\' 11"  (1.803 m), weight 74.8 kg, SpO2 98 %. Body mass index is 23.01 kg/m.   Demographic Factors:  Male and Low socioeconomic status  Loss Factors: Financial problems/change in socioeconomic status  Historical Factors: Family history of mental illness or substance abuse  Risk Reduction Factors:   Religious beliefs about death and Positive coping skills or problem solving skills  Continued Clinical Symptoms:  Previous Psychiatric Diagnoses and Treatments  Cognitive Features That Contribute To Risk:  Closed-mindedness    Suicide Risk:  Minimal: No identifiable suicidal ideation.  Patients presenting with no risk factors but with morbid ruminations; may be classified as minimal risk based on the severity of the depressive symptoms   Follow-up [place]. Schedule an appointment as soon as possible for a visit.   Specialty: Urgent Care Why: Walk-in hours are available for outpatient treatment (2nd floor)  Behavioral Health Urgent Care (1st floor)  is open 24 hours/7 days a week and is avaiable for urgent mental health care needs. Contact information: [place] [phone]               Medical Decision Making: At time of discharge, patient denies SI, HI, AVH and is able to contract for safety. He demonstrated no overt evidence of psychosis or mania. Prior to discharge the patient verbalized that he understood warning signs, triggers, and symptoms of  worsening mental health and how to access emergency mental health care if they felt it was needed. Patient was instructed to call 911 or return to the emergency room if they experienced any concerning symptoms after discharge. Patient voiced understanding and agreed to this.  Problem 1: Schizophrenia, paranoid type   Disposition: No evidence of imminent risk to self or others at present.   Supportive therapy provided about ongoing stressors. Discussed crisis plan, support from social network, calling 911, coming to the Emergency Department, and calling Suicide Hotline.     Discharge Instructions  Discharge recommendations:  Patient is to take medications as prescribed. Please see information for follow-up appointment with psychiatry and therapy. Please follow up with your primary care provider for all medical related needs.   Therapy: We recommend that patient participate in individual therapy to address mental health concerns.  Medications: The patient is to contact a medical professional and/or outpatient provider to address any new side effects that develop. Patient should update outpatient providers of any new medications and/or medication changes.   Atypical antipsychotics: If you are prescribed an atypical antipsychotic, it is recommended that your height, weight, BMI, blood pressure, fasting lipid panel, and fasting blood sugar be monitored by your outpatient providers.  Safety:  The patient should abstain from use of illicit substances/drugs and abuse of any medications. If symptoms worsen or do not continue to improve or if the patient becomes actively suicidal or homicidal then it is recommended that the patient return to the closest hospital emergency department, the [facility], or call 911 for further evaluation and treatment. National Suicide Prevention Lifeline 1-800-SUICIDE or 332-378-5317.  About 988 988 offers 24/7 access to  trained crisis counselors who can help people experiencing mental health-related distress. People can call or text 988 or chat 988lifeline.org for themselves or if they are worried about a loved one who may need crisis support.  Crisis Mobile: [facility]:                     636-010-6431 (for crisis response 24 hours a day) [place]:                                            [phone]     [facility]) M-F 8am-3pm   [address], [place] [address]   [phone] Services include: laundry, barbering, support groups, case management, phone  & computer access, showers, AA/NA mtgs, mental health/substance abuse nurse, job skills class, disability information, VA assistance, spiritual classes, etc.    [facility]    [address], [place]                                   [place]                                                                                                                                        [employer] (women and children)  [place], [place] [address] [phone] [email or web address] for application and process Application Required   [facility]                [address]                                 [place] [address]                                       [phone]                                                                                                                                                                                                     [place], [place] [address] [id number] application appt.) Application Required   [facility] (women only)                           [address]                                        [place], [place] [address]                                      [phone]                                                   Intake starts 6pm daily Need valid ID, SSC, & Police report [employer] [address] [place],  [id number] Application Required   [employer] (men only)                                   [place].  [place], [place]                                                     [place] (Pregnant women only) [address] [place], [place]   [facility]                                                   [place] Dani Gobble.                                                 [place], [place] [address]                               [place] [address] [place], [place] at [place] 90 day commitment/SA/Application process   [facility](men only)                                               [address]                                         [place], [place]                                                               [place] at [place]  [facility] [address] [place], [place] [address] [phone] Men/Women/Women and Children must be there by 7 pm   [facility] [place], [place] [phone]                                                Jackelyn Poling, NP 12/29/2021, 1:02 PM

## 2021-12-29 NOTE — ED Notes (Signed)
Earlier in shift patient requested benadryl for his allergies. Notified Dr. Charm Barges. New PRN order for PO benadryl ordered and given. Will continue to monitor.

## 2021-12-29 NOTE — ED Provider Notes (Addendum)
Emergency Medicine Observation Re-evaluation Note  Shane Small is a 36 y.o. male, seen on rounds today.  Pt initially presented to the ED for complaints of Aggressive Behavior Currently, the patient is Resting.  Physical Exam  BP 106/70 (BP Location: Right Arm)   Pulse (!) 48   Temp 97.6 F (36.4 C) (Oral)   Resp 18   Ht 1.803 m (5\' 11" )   Wt 74.8 kg   SpO2 98%   BMI 23.01 kg/m  Physical Exam General: Calm, sleeping, no acute distress Cardiac: Regular rate Lungs: Breathing easily Psych: Deferred  ED Course / MDM  EKG:EKG Interpretation  Date/Time:  Wednesday December 27 2021 23:51:06 EDT Ventricular Rate:  101 PR Interval:  170 QRS Duration: 98 QT Interval:  335 QTC Calculation: 435 R Axis:   77 Text Interpretation: Sinus tachycardia Early repolarization Otherwise normal ECG Confirmed by 04-22-1992 (Geoffery Lyons) on 12/28/2021 12:06:24 AM  I have reviewed the labs performed to date as well as medications administered while in observation.  Recent changes in the last 24 hours include seen by psychiatry..  Plan  Current plan is for inpatient psychiatric admission..  Patient has been medically cleared for psychiatric admission    12/30/2021, MD 12/29/21 02/28/22 Patient was reassessed by psychiatry this morning.  Patient is requesting discharge.  They feel that the patient has improved and does not meet criteria for IVC.  NP Berry's note reviewed.  Pt is appropriate for discharge   7425, MD 12/29/21 1312

## 2021-12-29 NOTE — Discharge Instructions (Addendum)
Discharge recommendations:  Patient is to take medications as prescribed. Please see information for follow-up appointment with psychiatry and therapy. Please follow up with your primary care provider for all medical related needs.   Therapy: We recommend that patient participate in individual therapy to address mental health concerns.  Medications: The patient is to contact a medical professional and/or outpatient provider to address any new side effects that develop. Patient should update outpatient providers of any new medications and/or medication changes.   Atypical antipsychotics: If you are prescribed an atypical antipsychotic, it is recommended that your height, weight, BMI, blood pressure, fasting lipid panel, and fasting blood sugar be monitored by your outpatient providers.  Safety:  The patient should abstain from use of illicit substances/drugs and abuse of any medications. If symptoms worsen or do not continue to improve or if the patient becomes actively suicidal or homicidal then it is recommended that the patient return to the closest hospital emergency department, the [facility], or call 911 for further evaluation and treatment. National Suicide Prevention Lifeline 1-800-SUICIDE or 423-575-0819.  About 988 988 offers 24/7 access to trained crisis counselors who can help people experiencing mental health-related distress. People can call or text 988 or chat 988lifeline.org for themselves or if they are worried about a loved one who may need crisis support.  Crisis Mobile: [facility]:                     9731442119 (for crisis response 24 hours a day) [place]:                                            [phone]     [facility]) M-F 8am-3pm   [address], [place] [address]   [phone] Services include: laundry, barbering, support groups, case management, phone   & computer access, showers, AA/NA mtgs, mental health/substance abuse nurse, job skills class, disability information, VA assistance, spiritual classes, etc.    [facility]    [address] [place]                                   [place]                                                                                                                                        [employer] (women and children)  [address] [place], [place] [address] [phone] [email or web address] for application and process Application Required   [facility]                [address]                                 [place] [address]                                       [phone]                                                                                                                                                                                                     [facility] of [place] [address] [place], [place] [address] [phone] application appt.) Application Required   [facility] (women only)                           [address]                                       [place], [place] [address]                                      [phone]                                                   Intake starts 6pm daily Need valid ID, SSC, & Police report [employer] [address] [place], [place] [phone] Application Required   [employer] (men only)                                   [address]  [place], [place]                                                     [place] (Pregnant women only) [address] [place], [place]   [facility]                                                   [place] Dani [address]ble.                                                 [place], [place] [address]                               [place] [address] [place], [place] 90 day commitment/SA/Application process   [facility](men only)                                               [address]                                         [place], [place]                                                               [place] at [place]  Crisis [facility] of [facility] [address] [place], [place] [address] [phone] Men/Women/Women and Children must be there by 7 pm   [place], [place]

## 2022-01-01 ENCOUNTER — Emergency Department (HOSPITAL_COMMUNITY): Payer: Self-pay

## 2022-01-01 ENCOUNTER — Emergency Department (HOSPITAL_COMMUNITY)
Admission: EM | Admit: 2022-01-01 | Discharge: 2022-01-02 | Disposition: A | Discharge status: Home / Self Care | Payer: Self-pay | Attending: Student | Admitting: Student

## 2022-01-01 DIAGNOSIS — F209 Schizophrenia, unspecified: Secondary | ICD-10-CM | POA: Insufficient documentation

## 2022-01-01 DIAGNOSIS — M79641 Pain in right hand: Secondary | ICD-10-CM | POA: Insufficient documentation

## 2022-01-01 DIAGNOSIS — Z23 Encounter for immunization: Secondary | ICD-10-CM | POA: Insufficient documentation

## 2022-01-01 DIAGNOSIS — Z5900 Homelessness unspecified: Secondary | ICD-10-CM | POA: Insufficient documentation

## 2022-01-01 DIAGNOSIS — M20011 Mallet finger of right finger(s): Secondary | ICD-10-CM

## 2022-01-01 NOTE — ED Provider Notes (Signed)
[facility] EMERGENCY DEPARTMENT Provider Note   CSN: [id number] Arrival date & time: 01/01/22  1658     History  Chief Complaint  Patient presents with   Hand Pain    Shane Small is a 36 y.o. male.  HPI   Medical history including schizophrenia, cannabis use, homelessness presents to the emergency department complaints of right hand pain, he is right-hand dominant, 3 days ago he got upset and punched a tree, since then he has been having pain in his fourth and fifth digits, he is unable to fully extend at the DIP joints in either finger, he still has full sensation, Sowles full range of motion in his PIP joints MCP joints in all her joints in his fingers and wrist.  He has no other complaints.    Home Medications Prior to Admission medications   Medication Sig Start Date End Date Taking? Authorizing Provider  acetaminophen (TYLENOL) 325 MG tablet Take 975 mg by mouth every 6 (six) hours as needed for mild pain or moderate pain.    [provider]  diphenhydrAMINE (BENADRYL) 25 MG tablet Take 25-50 mg by mouth every 8 (eight) hours as needed for itching, allergies or sleep.    [provider]  OLANZapine (ZYPREXA) 5 MG tablet Take 1 tablet (5 mg total) by mouth at bedtime. 12/29/21   Jackelyn Poling, NP  Polyethyl Glycol-Propyl Glycol (SYSTANE OP) Apply 2 drops to eye in the morning and at bedtime.    [provider]  atomoxetine (STRATTERA) 10 MG capsule Take 10 mg by mouth daily.  10/31/19  [provider]  omeprazole (PRILOSEC) 20 MG capsule Take 1 capsule (20 mg total) by mouth 2 (two) times daily before a meal. 09/19/19 10/31/19  Darr, Gerilyn Pilgrim, PA-C      Allergies    Shellfish allergy    Review of Systems   Review of Systems  Constitutional:  Negative for chills and fever.  Respiratory:  Negative for shortness of breath.   Cardiovascular:  Negative for chest pain.  Gastrointestinal:  Negative for abdominal pain.   Musculoskeletal:        Right finger pains  Neurological:  Negative for headaches.    Physical Exam Updated Vital Signs BP (!) 149/98 (BP Location: Right Arm)   Pulse 89   Temp 98.3 F (36.8 C) (Oral)   Resp 16   SpO2 97%  Physical Exam Vitals and nursing note reviewed.  Constitutional:      General: He is not in acute distress.    Appearance: He is not ill-appearing.  HENT:     Head: Normocephalic and atraumatic.     Nose: No congestion.  Eyes:     Conjunctiva/sclera: Conjunctivae normal.  Cardiovascular:     Rate and Rhythm: Normal rate and regular rhythm.     Pulses: Normal pulses.  Pulmonary:     Effort: Pulmonary effort is normal.  Musculoskeletal:     Comments: Focused exam of the right hand was performed, there is no lacerations or abrasions present, no obvious deformities, patient's fourth and fifth digit of the right hand is slightly flexed at the DIP joint, he is unable to fully extend at either joint, sensation is fully intact, 2-second capillary refill, he is moving all of the joints without difficulty in the fingers and wrist of the right hand.  Skin:    General: Skin is warm and dry.  Neurological:     Mental Status: He is alert.  Psychiatric:  Mood and Affect: Mood normal.     ED Results / Procedures / Treatments   Labs (all labs ordered are listed, but only abnormal results are displayed) Labs Reviewed - No data to display  EKG None  Radiology DG Hand Complete Right  Result Date: 01/01/2022 CLINICAL DATA:  Right hand and wrist pain. Punched a tree 3-4 days ago. Pain and swelling about the third through fifth digits. EXAM: RIGHT HAND - COMPLETE 3+ VIEW COMPARISON:  None Available. FINDINGS: There is no evidence of fracture or dislocation. There is no evidence of arthropathy or other focal bone abnormality. Minimal dorsal soft tissue edema overlies the medic carpals. IMPRESSION: No fracture or dislocation of the right hand. Minimal dorsal soft  tissue edema. Electronically Signed   By: Narda Rutherford M.D.   On: 01/01/2022 17:39   DG Wrist Complete Right  Result Date: 01/01/2022 CLINICAL DATA:  Right hand and wrist pain. Punched a tree 3-4 days ago. Pain and swelling about the third through fifth digits. EXAM: RIGHT WRIST - COMPLETE 3+ VIEW COMPARISON:  None Available. FINDINGS: There is no evidence of fracture or dislocation. There is no evidence of arthropathy or other focal bone abnormality. Soft tissues are unremarkable. IMPRESSION: Negative radiographs of the right wrist. Electronically Signed   By: Narda Rutherford M.D.   On: 01/01/2022 17:37    Procedures Procedures    Medications Ordered in ED Medications  Tdap (BOOSTRIX) injection 0.5 mL (has no administration in time range)    ED Course/ Medical Decision Making/ A&P                           Medical Decision Making  This patient presents to the ED for concern of right hand pain, this involves an extensive number of treatment options, and is a complaint that carries with it a high risk of complications and morbidity.  The differential diagnosis includes fracture, dislocation, compartment syndrome    Additional history obtained:  Additional history obtained from N/A External records from outside source obtained and reviewed including previous psychiatric notes   Co morbidities that complicate the patient evaluation  Schizophrenia  Social Determinants of Health:  Homeless    Lab Tests:  I Ordered, and personally interpreted labs.  The pertinent results include: N/A   Imaging Studies ordered:  I ordered imaging studies including x-ray of right hand and wrist I independently visualized and interpreted imaging which showed all negative I agree with the radiologist interpretation   Cardiac Monitoring:  The patient was maintained on a cardiac monitor.  I personally viewed and interpreted the cardiac monitored which showed an underlying rhythm of:  N/A   Medicines ordered and prescription drug management:  I ordered medication including N/A I have reviewed the patients home medicines and have made adjustments as needed  Critical Interventions:  N/a   Reevaluation: Presents with right hand pain, triage obtained imaging which I personally reviewed they are unremarkable, on my exam he is unable to extend at the fourth and fifth digit at the DIP joint suspect extensor ligament damage will consult with hand for further recommendations    Consultations Obtained:  I requested consultation with the orthopedics Dr. Yehuda Budd,  and discussed lab and imaging findings as well as pertinent plan - they recommend: Placed in a splint must leave on for 6 weeks and can follow-up at his office, splint should not cover the entire finger  Just the DIP joint.  Test Considered:  N/A    Rule out I have low suspicion for septic arthritis as patient denies IV drug use, skin exam was performed no erythematous, edematous, warm joints noted on exam, no new heart murmur heard on exam.  Low suspicion for fracture or dislocation as x-ray does not feel any significant findings.   Low suspicion for compartment syndrome as area was palpated it was soft to the touch, neurovascular fully intact.     Dispostion and problem list  After consideration of the diagnostic results and the patients response to treatment, I feel that the patent would benefit from discharge.  1.  Right finger injury-likely patient has a mallet injury, will place in a splint, have him follow-up with hand for further evaluation and strict return precautions.            Final Clinical Impression(s) / ED Diagnoses Final diagnoses:  Mallet finger of right hand    Rx / DC Orders ED Discharge Orders     None         Carroll Sage, PA-C 01/02/22 0010    Pricilla Loveless, MD 01/02/22 [phone]

## 2022-01-01 NOTE — ED Provider Triage Note (Signed)
Emergency Medicine Provider Triage Evaluation Note  Shane Small , a 36 y.o. male  was evaluated in triage.  Pt complains of right hand pain worst fourth and fifth digit.  Happened a few days ago after making contact by punching a tree.  Denies any contact with human mouth..  Review of Systems  Per HPI  Physical Exam  There were no vitals taken for this visit. Gen:   Awake, no distress   Resp:  Normal effort  MSK:   Moves extremities without difficulty  Other:  Cap refill less than 2, radial pulse 2+.  Tolerates passive ROM to wrist.  Can make a complete fist.  Tenderness to fourth and fifth digit MCPs.  Medical Decision Making  Medically screening exam initiated at 5:13 PM.  Appropriate orders placed.  Shane Small was informed that the remainder of the evaluation will be completed by another provider, this initial triage assessment does not replace that evaluation, and the importance of remaining in the ED until their evaluation is complete.     Shane Arista, PA-C 01/01/22 1714

## 2022-01-01 NOTE — ED Notes (Signed)
Ortho tech called 

## 2022-01-01 NOTE — Discharge Instructions (Addendum)
I have splinted your fingers, please leave on for 6 weeks, you may use over-the-counter pain medication as needed.  Please follow-up with orthopedics for further evaluation  Come back to the emergency department if you develop chest pain, shortness of breath, severe abdominal pain, uncontrolled nausea, vomiting, diarrhea.

## 2022-01-01 NOTE — ED Triage Notes (Signed)
Pt c/o R handed pain, worse in third-fifth finger; states he punched a tree 3-4 days ago out of frustration. ROM intact, but pt voices concern over "less movement."

## 2022-01-01 NOTE — ED Provider Notes (Signed)
Erroneous note. See MSE note.    Theron Arista, PA-C 01/01/22 2122    Glyn Ade, MD 01/01/22 2124

## 2022-01-02 MED ORDER — TETANUS-DIPHTH-ACELL PERTUSSIS 5-2.5-18.5 LF-MCG/0.5 IM SUSY
0.5000 mL | PREFILLED_SYRINGE | Freq: Once | INTRAMUSCULAR | Status: AC
  Administered 2022-01-02: 0.5 mL via INTRAMUSCULAR
  Filled 2022-01-02: qty 0.5

## 2022-01-02 NOTE — ED Notes (Signed)
Pt verbalized understanding of d/c instructions, meds, and followup care. Denies questions. VSS, no distress noted. Steady gait to exit with all belongings.  ?

## 2022-01-02 NOTE — Progress Notes (Signed)
Orthopedic Tech Progress Note Patient Details:  Shane Small 1986/01/20 [id number]  Ortho Devices Type of Ortho Device: Finger splint Ortho Device/Splint Location: rue 4th and 5th fingers splinted as requested by dr. Gaylord Shih Device/Splint Interventions: Ordered, Application, Adjustment   Post Interventions Patient Tolerated: Well Instructions Provided: Care of device, Adjustment of device  Trinna Post 01/02/2022, 12:15 AM

## 2022-01-05 ENCOUNTER — Ambulatory Visit (HOSPITAL_COMMUNITY)
Admission: EM | Admit: 2022-01-05 | Discharge: 2022-01-07 | Disposition: A | Discharge status: Home / Self Care | Payer: No Payment, Other | Attending: Family | Admitting: Family

## 2022-01-05 DIAGNOSIS — F1510 Other stimulant abuse, uncomplicated: Secondary | ICD-10-CM | POA: Diagnosis present

## 2022-01-05 DIAGNOSIS — F1910 Other psychoactive substance abuse, uncomplicated: Secondary | ICD-10-CM | POA: Diagnosis present

## 2022-01-05 DIAGNOSIS — F19951 Other psychoactive substance use, unspecified with psychoactive substance-induced psychotic disorder with hallucinations: Secondary | ICD-10-CM | POA: Diagnosis present

## 2022-01-05 DIAGNOSIS — F250 Schizoaffective disorder, bipolar type: Secondary | ICD-10-CM | POA: Diagnosis present

## 2022-01-05 DIAGNOSIS — F1590 Other stimulant use, unspecified, uncomplicated: Secondary | ICD-10-CM | POA: Insufficient documentation

## 2022-01-05 DIAGNOSIS — Z5902 Unsheltered homelessness: Secondary | ICD-10-CM | POA: Insufficient documentation

## 2022-01-05 DIAGNOSIS — F200 Paranoid schizophrenia: Secondary | ICD-10-CM | POA: Diagnosis present

## 2022-01-05 DIAGNOSIS — F1220 Cannabis dependence, uncomplicated: Secondary | ICD-10-CM | POA: Diagnosis present

## 2022-01-05 DIAGNOSIS — Z20822 Contact with and (suspected) exposure to covid-19: Secondary | ICD-10-CM | POA: Insufficient documentation

## 2022-01-05 DIAGNOSIS — F411 Generalized anxiety disorder: Secondary | ICD-10-CM | POA: Insufficient documentation

## 2022-01-05 DIAGNOSIS — F32A Depression, unspecified: Secondary | ICD-10-CM | POA: Insufficient documentation

## 2022-01-05 LAB — CBC WITH DIFFERENTIAL/PLATELET
Abs Immature Granulocytes: 0.01 10*3/uL (ref 0.00–0.07)
Basophils Absolute: 0 10*3/uL (ref 0.0–0.1)
Basophils Relative: 1 %
Eosinophils Absolute: 0.1 10*3/uL (ref 0.0–0.5)
Eosinophils Relative: 3 %
HCT: 41.4 % (ref 39.0–52.0)
Hemoglobin: 14.1 g/dL (ref 13.0–17.0)
Immature Granulocytes: 0 %
Lymphocytes Relative: 35 %
Lymphs Abs: 1.5 10*3/uL (ref 0.7–4.0)
MCH: 33.9 pg (ref 26.0–34.0)
MCHC: 34.1 g/dL (ref 30.0–36.0)
MCV: 99.5 fL (ref 80.0–100.0)
Monocytes Absolute: 0.5 10*3/uL (ref 0.1–1.0)
Monocytes Relative: 11 %
Neutro Abs: 2.1 10*3/uL (ref 1.7–7.7)
Neutrophils Relative %: 50 %
Platelets: 312 10*3/uL (ref 150–400)
RBC: 4.16 MIL/uL — ABNORMAL LOW (ref 4.22–5.81)
RDW: 12 % (ref 11.5–15.5)
WBC: 4.2 10*3/uL (ref 4.0–10.5)
nRBC: 0 % (ref 0.0–0.2)

## 2022-01-05 LAB — HEMOGLOBIN A1C
Hgb A1c MFr Bld: 4.2 % — ABNORMAL LOW (ref 4.8–5.6)
Mean Plasma Glucose: 73.84 mg/dL

## 2022-01-05 LAB — POCT URINE DRUG SCREEN - MANUAL ENTRY (I-SCREEN)
POC Amphetamine UR: POSITIVE — AB
POC Buprenorphine (BUP): NOT DETECTED
POC Cocaine UR: NOT DETECTED
POC Marijuana UR: POSITIVE — AB
POC Methadone UR: NOT DETECTED
POC Methamphetamine UR: NOT DETECTED
POC Morphine: NOT DETECTED
POC Oxazepam (BZO): POSITIVE — AB
POC Oxycodone UR: NOT DETECTED
POC Secobarbital (BAR): NOT DETECTED

## 2022-01-05 LAB — POC SARS CORONAVIRUS 2 AG: SARSCOV2ONAVIRUS 2 AG: NEGATIVE

## 2022-01-05 MED ORDER — OLANZAPINE 5 MG PO TABS: 5.0000 mg | ORAL_TABLET | Freq: Every day | ORAL | Status: DC

## 2022-01-05 MED ORDER — ALUM & MAG HYDROXIDE-SIMETH 200-200-20 MG/5ML PO SUSP: 30.0000 mL | ORAL | Status: DC | PRN

## 2022-01-05 MED ORDER — HYDROXYZINE HCL 25 MG PO TABS
25.0000 mg | ORAL_TABLET | Freq: Three times a day (TID) | ORAL | Status: DC | PRN
  Administered 2022-01-06: 25 mg via ORAL
  Filled 2022-01-05: qty 1
  Filled 2022-01-05: qty 10

## 2022-01-05 MED ORDER — ACETAMINOPHEN 325 MG PO TABS: 650.0000 mg | ORAL_TABLET | Freq: Four times a day (QID) | ORAL | Status: DC | PRN

## 2022-01-05 MED ORDER — TRAZODONE HCL 50 MG PO TABS
50.0000 mg | ORAL_TABLET | Freq: Every evening | ORAL | Status: DC | PRN
  Administered 2022-01-05 – 2022-01-06 (×2): 50 mg via ORAL
  Filled 2022-01-05: qty 7
  Filled 2022-01-05 (×2): qty 1

## 2022-01-05 MED ORDER — MAGNESIUM HYDROXIDE 400 MG/5ML PO SUSP: 30.0000 mL | Freq: Every day | ORAL | Status: DC | PRN

## 2022-01-05 MED ORDER — ARIPIPRAZOLE 5 MG PO TABS
5.0000 mg | ORAL_TABLET | Freq: Every day | ORAL | Status: DC
  Administered 2022-01-05 – 2022-01-06 (×2): 5 mg via ORAL
  Filled 2022-01-05 (×2): qty 1

## 2022-01-05 NOTE — ED Provider Notes (Incomplete)
[facility] Continuous Assessment Admission H&P  Date: 01/06/22 Patient Name: Shane Small MRN: [id number] Chief Complaint:  Chief Complaint  Patient presents with   Schizophrenia   Paranoid      Diagnoses:  Final diagnoses:  Paranoid schizophrenia ([place])    HPI: Shane Small is a 36 year old male with a history of paranoid schizophrenia.  Patient presented to [facility] via law enforcement for a walk-in assessment.  Patient was assessed face-to-face by this nurse practitioner.  On evaluation, patient is alert and oriented x 4; he is calm and cooperative.  Patient's speech is clear and coherent; he has good eye contact.  Patient's mood is anxious with congruent affect.  His thought process is coherent.  He did not appear to be responding to any internal/external stimuli during this interaction.    He reports that he has been experiencing worsening paranoia and auditory hallucination.  Patient reports that he has been compliant with his medication for the past 2 years.  He reports that he would like to be started back on Abilify.  Per chart review, patient was seen at [place] on 12-29-2021 by psychiatric provider, Shane Romp, NP.  At that time patient was restarted back on olanzapine 5 mg/day and recommended to follow-up with outpatient psychiatric provider.  Patient reported that he  has not been taking olanzapine since discharge; he says he does not want to take olanzapine and he is requesting to be placed on Abilify.   Patient denies suicidal ideation; he is endorsing homicidal ideation with thoughts of " wanting to hurt people."  He says he has thoughts of hurting family members because they are unsupportive and they would not allow him to leave with them.  He denies auditory and visual hallucination.  He endorses chronic paranoia and states "I feel someone is trying to hurt me, and need to stay on my guard, and I am going to try to hurt them back."  He denies substance abuse.  Patient  reports that he is currently homeless and he lives in his car.  Discussed restarting olanzapine 5 mg/day for psychosis.  Patient was initially agreeable to restarting this olanzapine however patient refused to take medication once he was admitted to the unit.  He was adamant about not taking olanzapine and requested to be put on Abilify   PHQ 2-9:  Flowsheet Row Video Visit from 04/08/2020 in [facility]  Thoughts that you would be better off dead, or of hurting yourself in some way Not at all  PHQ-9 Total Score 13       [place] ED from 01/05/2022 in [facility] ED from 01/01/2022 in [place] ED from 12/27/2021 in [place] DEPT  C-SSRS RISK CATEGORY No Risk No Risk No Risk        Total Time spent with patient: 20 minutes  Musculoskeletal  Strength & Muscle Tone: within normal limits Gait & Station: normal Patient leans: Right  Psychiatric Specialty Exam  Presentation General Appearance: Disheveled  Eye Contact:Good  Speech:Clear and Coherent  Speech Volume:Decreased  Handedness:Right   Mood and Affect  Mood:Anxious  Affect:Congruent   Thought Process  Thought Processes:Disorganized  Descriptions of Associations:Loose  Orientation:Full (Time, Place and Person)  Thought Content:WDL  Diagnosis of Schizophrenia or Schizoaffective disorder in past: Yes  Duration of Psychotic Symptoms: Greater than six months  Hallucinations:Hallucinations: Auditory Description of Auditory Hallucinations: "voices of people saying scary and funny  things, people laughing at me"  Ideas of Reference:Paranoia  Suicidal Thoughts:Suicidal Thoughts: No  Homicidal Thoughts:Homicidal Thoughts: Yes, Passive HI Passive Intent and/or Plan: With Plan   Sensorium  Memory:Recent Fair; Remote Poor; Immediate Fair  Judgment:Poor  Insight:Poor   Executive  Functions  Concentration:Fair  Attention Span:Fair  Recall:Fair  Fund of Knowledge:Fair  Language:Fair   Psychomotor Activity  Psychomotor Activity:Psychomotor Activity: Normal   Assets  Assets:Communication Skills; Desire for Improvement; Physical Health   Sleep  Sleep:Sleep: Fair Number of Hours of Sleep: 6   Nutritional Assessment (For OBS and FBC admissions only) Has the patient had a weight loss or gain of 10 pounds or more in the last 3 months?: No Has the patient had a decrease in food intake/or appetite?: No Does the patient have dental problems?: No Does the patient have eating habits or behaviors that may be indicators of an eating disorder including binging or inducing vomiting?: No Has the patient recently lost weight without trying?: 0 Has the patient been eating poorly because of a decreased appetite?: 0 Malnutrition Screening Tool Score: 0    Physical Exam Vitals and nursing note reviewed.  Constitutional:      General: He is not in acute distress.    Appearance: He is well-developed.  HENT:     Head: Normocephalic and atraumatic.  Eyes:     Conjunctiva/sclera: Conjunctivae normal.  Cardiovascular:     Rate and Rhythm: Normal rate.     Heart sounds: No murmur heard. Pulmonary:     Effort: Pulmonary effort is normal. No respiratory distress.  Abdominal:     Palpations: Abdomen is soft.     Tenderness: There is no abdominal tenderness.  Musculoskeletal:        General: No swelling.     Cervical back: Neck supple.  Skin:    General: Skin is warm and dry.  Neurological:     Mental Status: He is alert and oriented to person, place, and time.  Psychiatric:        Attention and Perception: Attention and perception normal.        Mood and Affect: Mood is anxious and depressed.        Speech: Speech normal.        Behavior: Behavior normal. Behavior is cooperative.        Thought Content: Thought content is paranoid. Thought content is not  delusional. Thought content includes homicidal ideation. Thought content does not include homicidal or suicidal plan.    Review of Systems  Constitutional: Negative.   HENT: Negative.    Eyes: Negative.   Respiratory: Negative.    Cardiovascular: Negative.   Gastrointestinal: Negative.   Genitourinary: Negative.   Musculoskeletal: Negative.   Skin: Negative.   Neurological: Negative.   Endo/Heme/Allergies: Negative.   Psychiatric/Behavioral:  Positive for depression and hallucinations. The patient is nervous/anxious.     Blood pressure 124/89, pulse 78, temperature 99.2 F (37.3 C), temperature source Oral, resp. rate 16, SpO2 98 %. There is no height or weight on file to calculate BMI.  Past Psychiatric History:    Is the patient at risk to self? No  Has the patient been a risk to self in the past 6 months? No .    Has the patient been a risk to self within the distant past? Yes   Is the patient a risk to others? Yes   Has the patient been a risk to others in the past 6 months? No   Has  the patient been a risk to others within the distant past? Yes   Past Medical History:  Past Medical History:  Diagnosis Date   Anxiety    Asthma    Daily headache    Depression    Paranoid schizophrenia ([place])    Schizophrenia ([place])    Stroke ([place])     Past Surgical History:  Procedure Laterality Date   NO PAST SURGERIES      Family History:  Family History  Problem Relation Age of Onset   Asthma Mother    Diabetes Father    Hypertension Father    Colon cancer Father 39    Social History:  Social History   Socioeconomic History   Marital status: Single    Spouse name: Not on file   Number of children: 1   Years of education: Not on file   Highest education level: Not on file  Occupational History   Occupation: Glass blower/designer  Tobacco Use   Smoking status: Every Day    Types: Cigarettes   Smokeless tobacco: Never  Vaping Use   Vaping Use: Every day  Substance  and Sexual Activity   Alcohol use: Yes    Alcohol/week: 56.0 standard drinks of alcohol    Types: 28 Cans of beer, 28 Shots of liquor per week    Comment: occasionally   Drug use: Yes    Comment: CBD   Sexual activity: Not on file  Other Topics Concern   Not on file  Social History Narrative   Not on file   Social Determinants of Health   Financial Resource Strain: Not on file  Food Insecurity: Not on file  Transportation Needs: Not on file  Physical Activity: Not on file  Stress: Not on file  Social Connections: Not on file  Intimate Partner Violence: Not on file    SDOH:  SDOH Screenings   Alcohol Screen: Low Risk  (03/06/2017)  Depression (PHQ2-9): Medium Risk (04/08/2020)  Tobacco Use: High Risk (12/27/2021)    Last Labs:  Admission on 01/05/2022  Component Date Value Ref Range Status   SARS Coronavirus 2 by RT PCR 01/05/2022 NEGATIVE  NEGATIVE Final   Comment: (NOTE) SARS-CoV-2 target nucleic acids are NOT DETECTED.  The SARS-CoV-2 RNA is generally detectable in upper respiratory specimens during the acute phase of infection. The lowest concentration of SARS-CoV-2 viral copies this assay can detect is 138 copies/mL. A negative result does not preclude SARS-Cov-2 infection and should not be used as the sole basis for treatment or other patient management decisions. A negative result may occur with  improper specimen collection/handling, submission of specimen other than nasopharyngeal swab, presence of viral mutation(s) within the areas targeted by this assay, and inadequate number of viral copies(<138 copies/mL). A negative result must be combined with clinical observations, patient history, and epidemiological information. The expected result is Negative.  Fact Sheet for Patients:  EntrepreneurPulse.com.au  Fact Sheet for Healthcare Providers:  IncredibleEmployment.be  This test is no                          t yet  approved or cleared by the [place] FDA and  has been authorized for detection and/or diagnosis of SARS-CoV-2 by FDA under an Emergency Use Authorization (EUA). This EUA will remain  in effect (meaning this test can be used) for the duration of the COVID-19 declaration under Section 564(b)(1) of the Act, 21 U.S.C.section 360bbb-3(b)(1), unless the authorization is  terminated  or revoked sooner.       Influenza A by PCR 01/05/2022 NEGATIVE  NEGATIVE Final   Influenza B by PCR 01/05/2022 NEGATIVE  NEGATIVE Final   Comment: (NOTE) The Xpert Xpress SARS-CoV-2/FLU/RSV plus assay is intended as an aid in the diagnosis of influenza from Nasopharyngeal swab specimens and should not be used as a sole basis for treatment. Nasal washings and aspirates are unacceptable for Xpert Xpress SARS-CoV-2/FLU/RSV testing.  Fact Sheet for Patients: BloggerCourse.com  Fact Sheet for Healthcare Providers: SeriousBroker.it  This test is not yet approved or cleared by the [place] FDA and has been authorized for detection and/or diagnosis of SARS-CoV-2 by FDA under an Emergency Use Authorization (EUA). This EUA will remain in effect (meaning this test can be used) for the duration of the COVID-19 declaration under Section 564(b)(1) of the Act, 21 U.S.C. section 360bbb-3(b)(1), unless the authorization is terminated or revoked.  Performed at [facility], [address], [place], [place] [address]    WBC 01/05/2022 4.2  4.0 - 10.5 K/uL Final   RBC 01/05/2022 4.16 (L)  4.22 - 5.81 MIL/uL Final   Hemoglobin 01/05/2022 14.1  13.0 - 17.0 g/dL Final   HCT 45/36/4680 41.4  39.0 - 52.0 % Final   MCV 01/05/2022 99.5  80.0 - 100.0 fL Final   MCH 01/05/2022 33.9  26.0 - 34.0 pg Final   MCHC 01/05/2022 34.1  30.0 - 36.0 g/dL Final   RDW 32/03/2481 12.0  11.5 - 15.5 % Final   Platelets 01/05/2022 312  150 - 400 K/uL Final   nRBC 01/05/2022 0.0   0.0 - 0.2 % Final   Neutrophils Relative % 01/05/2022 50  % Final   Neutro Abs 01/05/2022 2.1  1.7 - 7.7 K/uL Final   Lymphocytes Relative 01/05/2022 35  % Final   Lymphs Abs 01/05/2022 1.5  0.7 - 4.0 K/uL Final   Monocytes Relative 01/05/2022 11  % Final   Monocytes Absolute 01/05/2022 0.5  0.1 - 1.0 K/uL Final   Eosinophils Relative 01/05/2022 3  % Final   Eosinophils Absolute 01/05/2022 0.1  0.0 - 0.5 K/uL Final   Basophils Relative 01/05/2022 1  % Final   Basophils Absolute 01/05/2022 0.0  0.0 - 0.1 K/uL Final   Immature Granulocytes 01/05/2022 0  % Final   Abs Immature Granulocytes 01/05/2022 0.01  0.00 - 0.07 K/uL Final   Performed at [facility], [address], [place], [place] [address]   Sodium 01/05/2022 139  135 - 145 mmol/L Final   Potassium 01/05/2022 4.2  3.5 - 5.1 mmol/L Final   Chloride 01/05/2022 104  98 - 111 mmol/L Final   CO2 01/05/2022 27  22 - 32 mmol/L Final   Glucose, Bld 01/05/2022 85  70 - 99 mg/dL Final   Glucose reference range applies only to samples taken after fasting for at least 8 hours.   BUN 01/05/2022 8  6 - 20 mg/dL Final   Creatinine, Ser 01/05/2022 0.98  0.61 - 1.24 mg/dL Final   Calcium 04/88/8916 9.3  8.9 - 10.3 mg/dL Final   Total Protein 94/50/3888 7.5  6.5 - 8.1 g/dL Final   Albumin 28/00/3491 4.4  3.5 - 5.0 g/dL Final   AST 79/15/0569 23  15 - 41 U/L Final   ALT 01/05/2022 19  0 - 44 U/L Final   Alkaline Phosphatase 01/05/2022 55  38 - 126 U/L Final   Total Bilirubin 01/05/2022 1.4 (H)  0.3 - 1.2 mg/dL Final   GFR, Estimated 01/05/2022 >60  >60 mL/min Final   Comment: (NOTE) Calculated using the CKD-EPI Creatinine Equation (2021)    Anion gap 01/05/2022 8  5 - 15 Final   Performed at [facility], [address] [place], [place] [address]   Hgb A1c MFr Bld 01/05/2022 4.2 (L)  4.8 - 5.6 % Final   Comment: (NOTE) Pre diabetes:          5.7%-6.4%  Diabetes:              >6.4%  Glycemic control for   <7.0% adults with  diabetes    Mean Plasma Glucose 01/05/2022 73.84  mg/dL Final   Performed at [facility], [address], [place], [place] [address]   Alcohol, Ethyl (B) 01/05/2022 <10  <10 mg/dL Final   Comment: (NOTE) Lowest detectable limit for serum alcohol is 10 mg/dL.  For medical purposes only. Performed at [facility], [address], [place], [place] [address]    Cholesterol 01/05/2022 179  0 - 200 mg/dL Final   Triglycerides 27/51/7001 84  <150 mg/dL Final   HDL 74/94/4967 64  >40 mg/dL Final   Total CHOL/HDL Ratio 01/05/2022 2.8  RATIO Final   VLDL 01/05/2022 17  0 - 40 mg/dL Final   LDL Cholesterol 01/05/2022 98  0 - 99 mg/dL Final   Comment:        Total Cholesterol/HDL:CHD Risk Coronary Heart Disease Risk Table                     Men   Women  1/2 Average Risk   3.4   3.3  Average Risk       5.0   4.4  2 X Average Risk   9.6   7.1  3 X Average Risk  23.4   11.0        Use the calculated Patient Ratio above and the CHD Risk Table to determine the patient's CHD Risk.        ATP III CLASSIFICATION (LDL):  <100     mg/dL   Optimal  591-638  mg/dL   Near or Above                    Optimal  130-159  mg/dL   Borderline  466-599  mg/dL   High  >357     mg/dL   Very High Performed at [facility], [address], [place], [place] [address]    POC Amphetamine UR 01/05/2022 Positive (A)  NONE DETECTED (Cut Off Level 1000 ng/mL) Final   POC Secobarbital (BAR) 01/05/2022 None Detected  NONE DETECTED (Cut Off Level 300 ng/mL) Final   POC Buprenorphine (BUP) 01/05/2022 None Detected  NONE DETECTED (Cut Off Level 10 ng/mL) Final   POC Oxazepam (BZO) 01/05/2022 Positive (A)  NONE DETECTED (Cut Off Level 300 ng/mL) Final   POC Cocaine UR 01/05/2022 None Detected  NONE DETECTED (Cut Off Level 300 ng/mL) Final   POC Methamphetamine UR 01/05/2022 None Detected  NONE DETECTED (Cut Off Level 1000 ng/mL) Final   POC Morphine 01/05/2022 None Detected  NONE DETECTED (Cut Off  Level 300 ng/mL) Final   POC Methadone UR 01/05/2022 None Detected  NONE DETECTED (Cut Off Level 300 ng/mL) Final   POC Oxycodone UR 01/05/2022 None Detected  NONE DETECTED (Cut Off Level 100 ng/mL) Final   POC Marijuana UR 01/05/2022  Positive (A)  NONE DETECTED (Cut Off Level 50 ng/mL) Final   SARSCOV2ONAVIRUS 2 AG 01/05/2022 NEGATIVE  NEGATIVE Final   Comment: (NOTE) SARS-CoV-2 antigen NOT DETECTED.   Negative results are presumptive.  Negative results do not preclude SARS-CoV-2 infection and should not be used as the sole basis for treatment or other patient management decisions, including infection  control decisions, particularly in the presence of clinical signs and  symptoms consistent with COVID-19, or in those who have been in contact with the virus.  Negative results must be combined with clinical observations, patient history, and epidemiological information. The expected result is Negative.  Fact Sheet for Patients: HandmadeRecipes.com.cy  Fact Sheet for Healthcare Providers: FuneralLife.at  This test is not yet approved or cleared by the [place] FDA and  has been authorized for detection and/or diagnosis of SARS-CoV-2 by FDA under an Emergency Use Authorization (EUA).  This EUA will remain in effect (meaning this test can be used) for the duration of  the COV                          ID-19 declaration under Section 564(b)(1) of the Act, 21 U.S.C. section 360bbb-3(b)(1), unless the authorization is terminated or revoked sooner.    Admission on 12/27/2021, Discharged on 12/29/2021  Component Date Value Ref Range Status   WBC 12/28/2021 5.8  4.0 - 10.5 K/uL Final   RBC 12/28/2021 3.48 (L)  4.22 - 5.81 MIL/uL Final   Hemoglobin 12/28/2021 11.7 (L)  13.0 - 17.0 g/dL Final   HCT 12/28/2021 34.9 (L)  39.0 - 52.0 % Final   MCV 12/28/2021 100.3 (H)  80.0 - 100.0 fL Final   MCH 12/28/2021 33.6  26.0 - 34.0 pg Final   MCHC  12/28/2021 33.5  30.0 - 36.0 g/dL Final   RDW 12/28/2021 12.2  11.5 - 15.5 % Final   Platelets 12/28/2021 242  150 - 400 K/uL Final   nRBC 12/28/2021 0.0  0.0 - 0.2 % Final   Neutrophils Relative % 12/28/2021 84  % Final   Neutro Abs 12/28/2021 4.8  1.7 - 7.7 K/uL Final   Lymphocytes Relative 12/28/2021 10  % Final   Lymphs Abs 12/28/2021 0.6 (L)  0.7 - 4.0 K/uL Final   Monocytes Relative 12/28/2021 5  % Final   Monocytes Absolute 12/28/2021 0.3  0.1 - 1.0 K/uL Final   Eosinophils Relative 12/28/2021 1  % Final   Eosinophils Absolute 12/28/2021 0.0  0.0 - 0.5 K/uL Final   Basophils Relative 12/28/2021 0  % Final   Basophils Absolute 12/28/2021 0.0  0.0 - 0.1 K/uL Final   Immature Granulocytes 12/28/2021 0  % Final   Abs Immature Granulocytes 12/28/2021 0.02  0.00 - 0.07 K/uL Final   Performed at [facility], [place] [address] [place], [place] [address]   Sodium 12/28/2021 139  135 - 145 mmol/L Final   Potassium 12/28/2021 3.8  3.5 - 5.1 mmol/L Final   Chloride 12/28/2021 109  98 - 111 mmol/L Final   CO2 12/28/2021 24  22 - 32 mmol/L Final   Glucose, Bld 12/28/2021 89  70 - 99 mg/dL Final   Glucose reference range applies only to samples taken after fasting for at least 8 hours.   BUN 12/28/2021 15  6 - 20 mg/dL Final   Creatinine, Ser 12/28/2021 1.26 (H)  0.61 - 1.24 mg/dL Final   Calcium 12/28/2021 8.9  8.9 - 10.3 mg/dL  Final   Total Protein 12/28/2021 6.6  6.5 - 8.1 g/dL Final   Albumin 12/28/2021 3.7  3.5 - 5.0 g/dL Final   AST 12/28/2021 29  15 - 41 U/L Final   ALT 12/28/2021 21  0 - 44 U/L Final   Alkaline Phosphatase 12/28/2021 58  38 - 126 U/L Final   Total Bilirubin 12/28/2021 0.7  0.3 - 1.2 mg/dL Final   GFR, Estimated 12/28/2021 >60  >60 mL/min Final   Comment: (NOTE) Calculated using the CKD-EPI Creatinine Equation (2021)    Anion gap 12/28/2021 6  5 - 15 Final   Performed at [facility], [place] [address], [place], [place] [address]    Alcohol, Ethyl (B) 12/28/2021 <10  <10 mg/dL Final   Comment: (NOTE) Lowest detectable limit for serum alcohol is 10 mg/dL.  For medical purposes only. Performed at [facility], [place] [address] [place], [place] [address]    Opiates 12/28/2021 NONE DETECTED  NONE DETECTED Final   Cocaine 12/28/2021 NONE DETECTED  NONE DETECTED Final   Benzodiazepines 12/28/2021 POSITIVE (A)  NONE DETECTED Final   Amphetamines 12/28/2021 NONE DETECTED  NONE DETECTED Final   Tetrahydrocannabinol 12/28/2021 POSITIVE (A)  NONE DETECTED Final   Barbiturates 12/28/2021 NONE DETECTED  NONE DETECTED Final   Comment: (NOTE) DRUG SCREEN FOR MEDICAL PURPOSES ONLY.  IF CONFIRMATION IS NEEDED FOR ANY PURPOSE, NOTIFY LAB WITHIN 5 DAYS.  LOWEST DETECTABLE LIMITS FOR URINE DRUG SCREEN Drug Class                     Cutoff (ng/mL) Amphetamine and metabolites    1000 Barbiturate and metabolites    200 Benzodiazepine                 [id number] Tricyclics and metabolites     300 Opiates and metabolites        300 Cocaine and metabolites        300 THC                            50 Performed at [facility], [place] [address] [place], [place] [id number]    Salicylate Lvl 99991111 <7.0 (L)  7.0 - 30.0 mg/dL Final   Performed at [place] [address], [place], [place] [address]   Acetaminophen (Tylenol), Serum 12/28/2021 <10 (L)  10 - 30 ug/mL Final   Comment: (NOTE) Therapeutic concentrations vary significantly. A range of 10-30 ug/mL  may be an effective concentration for many patients. However, some  are best treated at concentrations outside of this range. Acetaminophen concentrations >150 ug/mL at 4 hours after ingestion  and >50 ug/mL at 12 hours after ingestion are often associated with  toxic reactions.  Performed at [facility], [place] [address], [place],  [address]    SARS Coronavirus 2 by RT PCR 12/28/2021 NEGATIVE   NEGATIVE Final   Comment: (NOTE) SARS-CoV-2 target nucleic acids are NOT DETECTED.  The SARS-CoV-2 RNA is generally detectable in upper respiratory specimens during the acute phase of infection. The lowest concentration of SARS-CoV-2 viral copies this assay can detect is 138 copies/mL. A negative result does not preclude SARS-Cov-2 infection and should not be used as the sole basis for treatment or other patient management decisions. A negative result may occur with  improper specimen collection/handling, submission of specimen other than nasopharyngeal swab, presence of viral mutation(s) within  the areas targeted by this assay, and inadequate number of viral copies(<138 copies/mL). A negative result must be combined with clinical observations, patient history, and epidemiological information. The expected result is Negative.  Fact Sheet for Patients:  EntrepreneurPulse.com.au  Fact Sheet for Healthcare Providers:  IncredibleEmployment.be  This test is no                          t yet approved or cleared by the [place] FDA and  has been authorized for detection and/or diagnosis of SARS-CoV-2 by FDA under an Emergency Use Authorization (EUA). This EUA will remain  in effect (meaning this test can be used) for the duration of the COVID-19 declaration under Section 564(b)(1) of the Act, 21 U.S.C.section 360bbb-3(b)(1), unless the authorization is terminated  or revoked sooner.       Influenza A by PCR 12/28/2021 NEGATIVE  NEGATIVE Final   Influenza B by PCR 12/28/2021 NEGATIVE  NEGATIVE Final   Comment: (NOTE) The Xpert Xpress SARS-CoV-2/FLU/RSV plus assay is intended as an aid in the diagnosis of influenza from Nasopharyngeal swab specimens and should not be used as a sole basis for treatment. Nasal washings and aspirates are unacceptable for Xpert Xpress SARS-CoV-2/FLU/RSV testing.  Fact Sheet for  Patients: EntrepreneurPulse.com.au  Fact Sheet for Healthcare Providers: IncredibleEmployment.be  This test is not yet approved or cleared by the [place] FDA and has been authorized for detection and/or diagnosis of SARS-CoV-2 by FDA under an Emergency Use Authorization (EUA). This EUA will remain in effect (meaning this test can be used) for the duration of the COVID-19 declaration under Section 564(b)(1) of the Act, 21 U.S.C. section 360bbb-3(b)(1), unless the authorization is terminated or revoked.  Performed at [facility], [place] [address], [place], [place] [address]     Allergies: Shellfish allergy  PTA Medications: (Not in a hospital admission)   Medical Decision Making  Patient will be admitted to [facility] for continuous assessment with follow-up by psychiatry on day shift 01/06/22.  Discussed restarting olanzapine 5 mg/day for psychosis.  Patient was initially agreeable to restarting this olanzapine however patient refused to take medication once he was admitted to the unit.  He was adamant about not taking olanzapine and requested to be put on Abilify     Recommendations  Based on my evaluation the patient does not appear to have an emergency medical condition.  Ophelia Shoulder, NP 01/06/22  5:33 AM

## 2022-01-05 NOTE — ED Notes (Signed)
Pt assessed in RM 135. A&Ox4, calm and mostly cooperative with no complaints of pain. Pt endorses paranoid schizophrenia and is "at his breaking point." Pt currently denies SI, HA, and AVH at present but has "feelings and urges that someone wants to hurt him." Says that his family is not backing him up and that every time he's getting things right he gets let go from a job. Pt appears reserved and guarded in his assessment. Pt asked for and given food/drink. Will continue to monitor for safety.

## 2022-01-05 NOTE — BH Assessment (Signed)
Shane Small, Urgent; 36 year old presents voluntarily to [facility] via [employer] and unaccompanied.  Pt denies SI. Pt reports passive HI. Pt reports paranoia/schizophrenia, "I feel that everybody is trying to hurt me, which is causing me to want to hurt someone else".  Pt reports drinking beer today; also reports he is homeless.  Pt admits to not taken prescribed medication for symptom management.  Pt signed MSE.
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
## 2022-01-06 ENCOUNTER — Other Ambulatory Visit: Payer: Self-pay

## 2022-01-06 LAB — ETHANOL: Alcohol, Ethyl (B): <10 mg/dL (ref <10)

## 2022-01-06 LAB — COMPREHENSIVE METABOLIC PANEL
ALT: 19 U/L (ref 0–44)
AST: 23 U/L (ref 15–41)
Albumin: 4.4 g/dL (ref 3.5–5.0)
Alkaline Phosphatase: 55 U/L (ref 38–126)
Anion gap: 8 (ref 5–15)
BUN: 8 mg/dL (ref 6–20)
CO2: 27 mmol/L (ref 22–32)
Calcium: 9.3 mg/dL (ref 8.9–10.3)
Chloride: 104 mmol/L (ref 98–111)
Creatinine, Ser: 0.98 mg/dL (ref 0.61–1.24)
GFR, Estimated: >60 mL/min (ref >60)
Glucose, Bld: 85 mg/dL (ref 70–99)
Potassium: 4.2 mmol/L (ref 3.5–5.1)
Sodium: 139 mmol/L (ref 135–145)
Total Bilirubin: 1.4 mg/dL — ABNORMAL HIGH (ref 0.3–1.2)
Total Protein: 7.5 g/dL (ref 6.5–8.1)

## 2022-01-06 LAB — LIPID PANEL
Cholesterol: 179 mg/dL (ref 0–200)
HDL: 64 mg/dL (ref >40)
LDL Cholesterol: 98 mg/dL (ref 0–99)
Total CHOL/HDL Ratio: 2.8 RATIO
Triglycerides: 84 mg/dL (ref <150)
VLDL: 17 mg/dL (ref 0–40)

## 2022-01-06 LAB — RESP PANEL BY RT-PCR (FLU A&B, COVID) ARPGX2
Influenza A by PCR: NEGATIVE
Influenza B by PCR: NEGATIVE
SARS Coronavirus 2 by RT PCR: NEGATIVE

## 2022-01-06 MED ORDER — ARIPIPRAZOLE 15 MG PO TABS
7.5000 mg | ORAL_TABLET | Freq: Every day | ORAL | Status: DC
  Administered 2022-01-07: 7.5 mg via ORAL
  Filled 2022-01-06: qty 1

## 2022-01-06 MED ORDER — LORATADINE 10 MG PO TABS
10.0000 mg | ORAL_TABLET | Freq: Every day | ORAL | Status: DC
  Administered 2022-01-06 – 2022-01-07 (×2): 10 mg via ORAL
  Filled 2022-01-06 (×2): qty 1

## 2022-01-06 NOTE — ED Notes (Signed)
Pt sleeping@this time. Breathing even and unlabored. Will continue to monitor for safety 

## 2022-01-06 NOTE — ED Notes (Signed)
Pt asleep in bed. Respirations even and unlabored with no signs of acute distress noted. Will continue to monitor for safety.

## 2022-01-06 NOTE — ED Provider Notes (Signed)
Behavioral Health Progress Note  Date and Time: 01/06/2022 3:58 PM Name: Shane Small MRN:  [id number]  Subjective:  Shane Small stated " I am okay, I guess."  Shane Small was seen and evaluated face-to-face by this provider.  He is awake alert and oriented x3.  Denying suicidal or homicidal ideations.  Denies auditory visual hallucinations.  Patient has a charted history with schizophrenia bipolar type, generalized anxiety disorder and substance-induced mood disorder.  Denied that he has been taking his medications as indicated. Stated " I don't have any money to get what I need.."  Reports he is seeking resources to get mental health disability.  Stated he would like his own house.   Discussed following up with [facility] for additional outpatient assistance.  Encourage patient to follow-up with his outpatient provider to assist with filling out disability forms.   Has requested to be restarted on Abilify5 mg as opposed to Zyprexa 10 mg .  Reports his main stressors are related to homelessness.  States he has increased paranoia due to residing in his car.    Will recommend overnight observation increase Abilify 5 mg to 7.5 mg daily.  Anticipated discharge 01/06/2022.patient was made aware of disposition.    Diagnosis:  Final diagnoses:  Paranoid schizophrenia (HCC)    Total Time spent with patient: 15 minutes  Past Psychiatric History:  Past Medical History:  Past Medical History:  Diagnosis Date   Anxiety    Asthma    Daily headache    Depression    Paranoid schizophrenia (HCC)    Schizophrenia (HCC)    Stroke (HCC)     Past Surgical History:  Procedure Laterality Date   NO PAST SURGERIES     Family History:  Family History  Problem Relation Age of Onset   Asthma Mother    Diabetes Father    Hypertension Father    Colon cancer Father 55   Family Psychiatric  History:  Social History:  Social History   Substance and Sexual Activity  Alcohol Use Yes    Alcohol/week: 56.0 standard drinks of alcohol   Types: 28 Cans of beer, 28 Shots of liquor per week   Comment: occasionally     Social History   Substance and Sexual Activity  Drug Use Yes   Comment: CBD    Social History   Socioeconomic History   Marital status: Single    Spouse name: Not on file   Number of children: 1   Years of education: Not on file   Highest education level: Not on file  Occupational History   Occupation: Location manager  Tobacco Use   Smoking status: Every Day    Types: Cigarettes   Smokeless tobacco: Never  Vaping Use   Vaping Use: Every day  Substance and Sexual Activity   Alcohol use: Yes    Alcohol/week: 56.0 standard drinks of alcohol    Types: 28 Cans of beer, 28 Shots of liquor per week    Comment: occasionally   Drug use: Yes    Comment: CBD   Sexual activity: Not on file  Other Topics Concern   Not on file  Social History Narrative   Not on file   Social Determinants of Health   Financial Resource Strain: Not on file  Food Insecurity: Not on file  Transportation Needs: Not on file  Physical Activity: Not on file  Stress: Not on file  Social Connections: Not on file   SDOH:  SDOH Screenings  Alcohol Screen: Low Risk  (03/06/2017)  Depression (PHQ2-9): Medium Risk (04/08/2020)  Tobacco Use: High Risk (12/27/2021)   Additional Social History:    Pain Medications: See MRA Prescriptions: See MRA Over the Counter: See MRA History of alcohol / drug use?: Yes Longest period of sobriety (when/how long): NO sobrity reported. Negative Consequences of Use: Financial, Personal relationships Withdrawal Symptoms: None Name of Substance 1: Alcohol 1 - Age of First Use: 22 1 - Amount (size/oz): Beer 1 - Frequency: daily 1 - Duration: ongoing 1 - Last Use / Amount: 01/05/22 1 - Method of Aquiring: UTA 1- Route of Use: drinking 2 - Amount (size/oz): 1-2 beers 2 - Frequency: "about every day." 2 - Last Use / Amount: yesterday                 Sleep: Good  Appetite:  Good  Current Medications:  Current Facility-Administered Medications  Medication Dose Route Frequency Provider Last Rate Last Admin   acetaminophen (TYLENOL) tablet 650 mg  650 mg Oral Q6H PRN Ajibola, Ene A, NP       alum & mag hydroxide-simeth (MAALOX/MYLANTA) 200-200-20 MG/5ML suspension 30 mL  30 mL Oral Q4H PRN Ajibola, Ene A, NP       ARIPiprazole (ABILIFY) tablet 5 mg  5 mg Oral Daily Ajibola, Ene A, NP   5 mg at 01/06/22 0943   hydrOXYzine (ATARAX) tablet 25 mg  25 mg Oral TID PRN Ajibola, Ene A, NP       magnesium hydroxide (MILK OF MAGNESIA) suspension 30 mL  30 mL Oral Daily PRN Ajibola, Ene A, NP       traZODone (DESYREL) tablet 50 mg  50 mg Oral QHS PRN Ajibola, Ene A, NP   50 mg at 01/05/22 2322   No current outpatient medications on file.    Labs  Lab Results:  Admission on 01/05/2022  Component Date Value Ref Range Status   SARS Coronavirus 2 by RT PCR 01/05/2022 NEGATIVE  NEGATIVE Final   Comment: (NOTE) SARS-CoV-2 target nucleic acids are NOT DETECTED.  The SARS-CoV-2 RNA is generally detectable in upper respiratory specimens during the acute phase of infection. The lowest concentration of SARS-CoV-2 viral copies this assay can detect is 138 copies/mL. A negative result does not preclude SARS-Cov-2 infection and should not be used as the sole basis for treatment or other patient management decisions. A negative result may occur with  improper specimen collection/handling, submission of specimen other than nasopharyngeal swab, presence of viral mutation(s) within the areas targeted by this assay, and inadequate number of viral copies(<138 copies/mL). A negative result must be combined with clinical observations, patient history, and epidemiological information. The expected result is Negative.  Fact Sheet for Patients:  BloggerCourse.com  Fact Sheet for Healthcare Providers:   SeriousBroker.it  This test is no                          t yet approved or cleared by the [place] FDA and  has been authorized for detection and/or diagnosis of SARS-CoV-2 by FDA under an Emergency Use Authorization (EUA). This EUA will remain  in effect (meaning this test can be used) for the duration of the COVID-19 declaration under Section 564(b)(1) of the Act, 21 U.S.C.section 360bbb-3(b)(1), unless the authorization is terminated  or revoked sooner.       Influenza A by PCR 01/05/2022 NEGATIVE  NEGATIVE Final   Influenza B by PCR 01/05/2022 NEGATIVE  NEGATIVE Final   Comment: (NOTE) The Xpert Xpress SARS-CoV-2/FLU/RSV plus assay is intended as an aid in the diagnosis of influenza from Nasopharyngeal swab specimens and should not be used as a sole basis for treatment. Nasal washings and aspirates are unacceptable for Xpert Xpress SARS-CoV-2/FLU/RSV testing.  Fact Sheet for Patients: BloggerCourse.com  Fact Sheet for Healthcare Providers: SeriousBroker.it  This test is not yet approved or cleared by the [place] FDA and has been authorized for detection and/or diagnosis of SARS-CoV-2 by FDA under an Emergency Use Authorization (EUA). This EUA will remain in effect (meaning this test can be used) for the duration of the COVID-19 declaration under Section 564(b)(1) of the Act, 21 U.S.C. section 360bbb-3(b)(1), unless the authorization is terminated or revoked.  Performed at [facility], [address], [place], [place] [address]    WBC 01/05/2022 4.2  4.0 - 10.5 K/uL Final   RBC 01/05/2022 4.16 (L)  4.22 - 5.81 MIL/uL Final   Hemoglobin 01/05/2022 14.1  13.0 - 17.0 g/dL Final   HCT 60/45/4098 41.4  39.0 - 52.0 % Final   MCV 01/05/2022 99.5  80.0 - 100.0 fL Final   MCH 01/05/2022 33.9  26.0 - 34.0 pg Final   MCHC 01/05/2022 34.1  30.0 - 36.0 g/dL Final   RDW 11/91/4782 12.0   11.5 - 15.5 % Final   Platelets 01/05/2022 312  150 - 400 K/uL Final   nRBC 01/05/2022 0.0  0.0 - 0.2 % Final   Neutrophils Relative % 01/05/2022 50  % Final   Neutro Abs 01/05/2022 2.1  1.7 - 7.7 K/uL Final   Lymphocytes Relative 01/05/2022 35  % Final   Lymphs Abs 01/05/2022 1.5  0.7 - 4.0 K/uL Final   Monocytes Relative 01/05/2022 11  % Final   Monocytes Absolute 01/05/2022 0.5  0.1 - 1.0 K/uL Final   Eosinophils Relative 01/05/2022 3  % Final   Eosinophils Absolute 01/05/2022 0.1  0.0 - 0.5 K/uL Final   Basophils Relative 01/05/2022 1  % Final   Basophils Absolute 01/05/2022 0.0  0.0 - 0.1 K/uL Final   Immature Granulocytes 01/05/2022 0  % Final   Abs Immature Granulocytes 01/05/2022 0.01  0.00 - 0.07 K/uL Final   Performed at [facility], [address] [place], [place] [address]   Sodium 01/05/2022 139  135 - 145 mmol/L Final   Potassium 01/05/2022 4.2  3.5 - 5.1 mmol/L Final   Chloride 01/05/2022 104  98 - 111 mmol/L Final   CO2 01/05/2022 27  22 - 32 mmol/L Final   Glucose, Bld 01/05/2022 85  70 - 99 mg/dL Final   Glucose reference range applies only to samples taken after fasting for at least 8 hours.   BUN 01/05/2022 8  6 - 20 mg/dL Final   Creatinine, Ser 01/05/2022 0.98  0.61 - 1.24 mg/dL Final   Calcium 30/86/5784 9.3  8.9 - 10.3 mg/dL Final   Total Protein 69/62/9528 7.5  6.5 - 8.1 g/dL Final   Albumin 41/32/4401 4.4  3.5 - 5.0 g/dL Final   AST 02/72/5366 23  15 - 41 U/L Final   ALT 01/05/2022 19  0 - 44 U/L Final   Alkaline Phosphatase 01/05/2022 55  38 - 126 U/L Final   Total Bilirubin 01/05/2022 1.4 (H)  0.3 - 1.2 mg/dL Final   GFR, Estimated 01/05/2022 >60  >60 mL/min Final   Comment: (NOTE) Calculated using the CKD-EPI Creatinine Equation (2021)  Anion gap 01/05/2022 8  5 - 15 Final   Performed at [facility], [address] [place], [place] [address]   Hgb A1c MFr Bld 01/05/2022 4.2 (L)  4.8 - 5.6 % Final   Comment: (NOTE) Pre diabetes:           5.7%-6.4%  Diabetes:              >6.4%  Glycemic control for   <7.0% adults with diabetes    Mean Plasma Glucose 01/05/2022 73.84  mg/dL Final   Performed at [facility], [address], [place], [place] [address]   Alcohol, Ethyl (B) 01/05/2022 <10  <10 mg/dL Final   Comment: (NOTE) Lowest detectable limit for serum alcohol is 10 mg/dL.  For medical purposes only. Performed at [facility], [address] [place], [place] [address]    Cholesterol 01/05/2022 179  0 - 200 mg/dL Final   Triglycerides 24/40/1027 84  <150 mg/dL Final   HDL 25/36/6440 64  >40 mg/dL Final   Total CHOL/HDL Ratio 01/05/2022 2.8  RATIO Final   VLDL 01/05/2022 17  0 - 40 mg/dL Final   LDL Cholesterol 01/05/2022 98  0 - 99 mg/dL Final   Comment:        Total Cholesterol/HDL:CHD Risk Coronary Heart Disease Risk Table                     Men   Women  1/2 Average Risk   3.4   3.3  Average Risk       5.0   4.4  2 X Average Risk   9.6   7.1  3 X Average Risk  23.4   11.0        Use the calculated Patient Ratio above and the CHD Risk Table to determine the patient's CHD Risk.        ATP III CLASSIFICATION (LDL):  <100     mg/dL   Optimal  347-425  mg/dL   Near or Above                    Optimal  130-159  mg/dL   Borderline  956-387  mg/dL   High  >564     mg/dL   Very High Performed at [facility], [address] [place], [place] [address]    POC Amphetamine UR 01/05/2022 Positive (A)  NONE DETECTED (Cut Off Level 1000 ng/mL) Final   POC Secobarbital (BAR) 01/05/2022 None Detected  NONE DETECTED (Cut Off Level 300 ng/mL) Final   POC Buprenorphine (BUP) 01/05/2022 None Detected  NONE DETECTED (Cut Off Level 10 ng/mL) Final   POC Oxazepam (BZO) 01/05/2022 Positive (A)  NONE DETECTED (Cut Off Level 300 ng/mL) Final   POC Cocaine UR 01/05/2022 None Detected  NONE DETECTED (Cut Off Level 300 ng/mL) Final   POC Methamphetamine UR 01/05/2022 None Detected  NONE DETECTED (Cut Off  Level 1000 ng/mL) Final   POC Morphine 01/05/2022 None Detected  NONE DETECTED (Cut Off Level 300 ng/mL) Final   POC Methadone UR 01/05/2022 None Detected  NONE DETECTED (Cut Off Level 300 ng/mL) Final   POC Oxycodone UR 01/05/2022 None Detected  NONE DETECTED (Cut Off Level 100 ng/mL) Final   POC Marijuana UR 01/05/2022 Positive (A)  NONE DETECTED (Cut Off Level 50 ng/mL) Final   SARSCOV2ONAVIRUS 2 AG 01/05/2022 NEGATIVE  NEGATIVE Final   Comment: (NOTE) SARS-CoV-2 antigen NOT DETECTED.  Negative results are presumptive.  Negative results do not preclude SARS-CoV-2 infection and should not be used as the sole basis for treatment or other patient management decisions, including infection  control decisions, particularly in the presence of clinical signs and  symptoms consistent with COVID-19, or in those who have been in contact with the virus.  Negative results must be combined with clinical observations, patient history, and epidemiological information. The expected result is Negative.  Fact Sheet for Patients: https://www.jennings-kim.com/  Fact Sheet for Healthcare Providers: https://alexander-rogers.biz/  This test is not yet approved or cleared by the [place] FDA and  has been authorized for detection and/or diagnosis of SARS-CoV-2 by FDA under an Emergency Use Authorization (EUA).  This EUA will remain in effect (meaning this test can be used) for the duration of  the COV                          ID-19 declaration under Section 564(b)(1) of the Act, 21 U.S.C. section 360bbb-3(b)(1), unless the authorization is terminated or revoked sooner.    Admission on 12/27/2021, Discharged on 12/29/2021  Component Date Value Ref Range Status   WBC 12/28/2021 5.8  4.0 - 10.5 K/uL Final   RBC 12/28/2021 3.48 (L)  4.22 - 5.81 MIL/uL Final   Hemoglobin 12/28/2021 11.7 (L)  13.0 - 17.0 g/dL Final   HCT 16/01/9603 34.9 (L)  39.0 - 52.0 % Final   MCV  12/28/2021 100.3 (H)  80.0 - 100.0 fL Final   MCH 12/28/2021 33.6  26.0 - 34.0 pg Final   MCHC 12/28/2021 33.5  30.0 - 36.0 g/dL Final   RDW 54/12/8117 12.2  11.5 - 15.5 % Final   Platelets 12/28/2021 242  150 - 400 K/uL Final   nRBC 12/28/2021 0.0  0.0 - 0.2 % Final   Neutrophils Relative % 12/28/2021 84  % Final   Neutro Abs 12/28/2021 4.8  1.7 - 7.7 K/uL Final   Lymphocytes Relative 12/28/2021 10  % Final   Lymphs Abs 12/28/2021 0.6 (L)  0.7 - 4.0 K/uL Final   Monocytes Relative 12/28/2021 5  % Final   Monocytes Absolute 12/28/2021 0.3  0.1 - 1.0 K/uL Final   Eosinophils Relative 12/28/2021 1  % Final   Eosinophils Absolute 12/28/2021 0.0  0.0 - 0.5 K/uL Final   Basophils Relative 12/28/2021 0  % Final   Basophils Absolute 12/28/2021 0.0  0.0 - 0.1 K/uL Final   Immature Granulocytes 12/28/2021 0  % Final   Abs Immature Granulocytes 12/28/2021 0.02  0.00 - 0.07 K/uL Final   Performed at [facility], [address], [place], [place] [address]   Sodium 12/28/2021 139  135 - 145 mmol/L Final   Potassium 12/28/2021 3.8  3.5 - 5.1 mmol/L Final   Chloride 12/28/2021 109  98 - 111 mmol/L Final   CO2 12/28/2021 24  22 - 32 mmol/L Final   Glucose, Bld 12/28/2021 89  70 - 99 mg/dL Final   Glucose reference range applies only to samples taken after fasting for at least 8 hours.   BUN 12/28/2021 15  6 - 20 mg/dL Final   Creatinine, Ser 12/28/2021 1.26 (H)  0.61 - 1.24 mg/dL Final   Calcium 95/62/1308 8.9  8.9 - 10.3 mg/dL Final   Total Protein 65/78/4696 6.6  6.5 - 8.1 g/dL Final   Albumin 29/52/8413 3.7  3.5 - 5.0 g/dL Final   AST 24/40/1027 29  15 -  41 U/L Final   ALT 12/28/2021 21  0 - 44 U/L Final   Alkaline Phosphatase 12/28/2021 58  38 - 126 U/L Final   Total Bilirubin 12/28/2021 0.7  0.3 - 1.2 mg/dL Final   GFR, Estimated 12/28/2021 >60  >60 mL/min Final   Comment: (NOTE) Calculated using the CKD-EPI Creatinine Equation (2021)    Anion gap 12/28/2021 6  5 - 15  Final   Performed at [facility], [address] [place], [place] [address]   Alcohol, Ethyl (B) 12/28/2021 <10  <10 mg/dL Final   Comment: (NOTE) Lowest detectable limit for serum alcohol is 10 mg/dL.  For medical purposes only. Performed at [facility], [address], [place], [place] [address]    Opiates 12/28/2021 NONE DETECTED  NONE DETECTED Final   Cocaine 12/28/2021 NONE DETECTED  NONE DETECTED Final   Benzodiazepines 12/28/2021 POSITIVE (A)  NONE DETECTED Final   Amphetamines 12/28/2021 NONE DETECTED  NONE DETECTED Final   Tetrahydrocannabinol 12/28/2021 POSITIVE (A)  NONE DETECTED Final   Barbiturates 12/28/2021 NONE DETECTED  NONE DETECTED Final   Comment: (NOTE) DRUG SCREEN FOR MEDICAL PURPOSES ONLY.  IF CONFIRMATION IS NEEDED FOR ANY PURPOSE, NOTIFY LAB WITHIN 5 DAYS.  LOWEST DETECTABLE LIMITS FOR URINE DRUG SCREEN Drug Class                     Cutoff (ng/mL) Amphetamine and metabolites    1000 Barbiturate and metabolites    200 Benzodiazepine                 200 Tricyclics and metabolites     300 Opiates and metabolites        300 Cocaine and metabolites        300 THC                            50 Performed at [facility], [address] [place], [place] [address]    Salicylate Lvl 12/28/2021 <7.0 (L)  7.0 - 30.0 mg/dL Final   Performed at [facility], [address] [place], [place] [address]   Acetaminophen (Tylenol), Serum 12/28/2021 <10 (L)  10 - 30 ug/mL Final   Comment: (NOTE) Therapeutic concentrations vary significantly. A range of 10-30 ug/mL  may be an effective concentration for many patients. However, some  are best treated at concentrations outside of this range. Acetaminophen concentrations >150 ug/mL at 4 hours after ingestion  and >50 ug/mL at 12 hours after ingestion are often associated with  toxic reactions.  Performed at [facility],  [address], [place], [place] [address]    SARS Coronavirus 2 by RT PCR 12/28/2021 NEGATIVE  NEGATIVE Final   Comment: (NOTE) SARS-CoV-2 target nucleic acids are NOT DETECTED.  The SARS-CoV-2 RNA is generally detectable in upper respiratory specimens during the acute phase of infection. The lowest concentration of SARS-CoV-2 viral copies this assay can detect is 138 copies/mL. A negative result does not preclude SARS-Cov-2 infection and should not be used as the sole basis for treatment or other patient management decisions. A negative result may occur with  improper specimen collection/handling, submission of specimen other than nasopharyngeal swab, presence of viral mutation(s) within the areas targeted by this assay, and inadequate number of viral copies(<138 copies/mL). A negative result must be combined with clinical observations, patient history, and epidemiological information. The expected result is Negative.  Fact Sheet for Patients:  BloggerCourse.comhttps://www.fda.gov/media/152166/download  Fact Sheet for Healthcare Providers:  SeriousBroker.ithttps://www.fda.gov/media/152162/download  This test is no                          t yet approved or cleared by the [place] FDA and  has been authorized for detection and/or diagnosis of SARS-CoV-2 by FDA under an Emergency Use Authorization (EUA). This EUA will remain  in effect (meaning this test can be used) for the duration of the COVID-19 declaration under Section 564(b)(1) of the Act, 21 U.S.C.section 360bbb-3(b)(1), unless the authorization is terminated  or revoked sooner.       Influenza A by PCR 12/28/2021 NEGATIVE  NEGATIVE Final   Influenza B by PCR 12/28/2021 NEGATIVE  NEGATIVE Final   Comment: (NOTE) The Xpert Xpress SARS-CoV-2/FLU/RSV plus assay is intended as an aid in the diagnosis of influenza from Nasopharyngeal swab specimens and should not be used as a sole basis for treatment. Nasal washings and aspirates are unacceptable  for Xpert Xpress SARS-CoV-2/FLU/RSV testing.  Fact Sheet for Patients: BloggerCourse.comhttps://www.fda.gov/media/152166/download  Fact Sheet for Healthcare Providers: SeriousBroker.ithttps://www.fda.gov/media/152162/download  This test is not yet approved or cleared by the [place] FDA and has been authorized for detection and/or diagnosis of SARS-CoV-2 by FDA under an Emergency Use Authorization (EUA). This EUA will remain in effect (meaning this test can be used) for the duration of the COVID-19 declaration under Section 564(b)(1) of the Act, 21 U.S.C. section 360bbb-3(b)(1), unless the authorization is terminated or revoked.  Performed at [facility], [address] [place], [place] [id number]     Blood Alcohol level:  Lab Results  Component Value Date   ETH <10 01/05/2022   ETH <10 12/28/2021    Metabolic Disorder Labs: Lab Results  Component Value Date   HGBA1C 4.2 (L) 01/05/2022   MPG 73.84 01/05/2022   MPG 88 09/11/2016   Lab Results  Component Value Date   PROLACTIN 8.1 09/11/2016   Lab Results  Component Value Date   CHOL 179 01/05/2022   TRIG 84 01/05/2022   HDL 64 01/05/2022   CHOLHDL 2.8 01/05/2022   VLDL 17 01/05/2022   LDLCALC 98 01/05/2022   LDLCALC 104 (H) 09/11/2016    Therapeutic Lab Levels: No results found for: "LITHIUM" No results found for: "VALPROATE" No results found for: "CBMZ"  Physical Findings   AIMS    Flowsheet Row [facility]  AIMS Total Score 0      AUDIT    Flowsheet Row [facility]  Alcohol Use Disorder Identification Test Final Score (AUDIT) 2      GAD-7    Flowsheet [facility]  Total GAD-7 Score 15      PHQ2-9    Flow[facility]   PHQ-2 Total Score 3  PHQ-9 Total Score 13      Flowsheet [facility]27/2021 in [facility]  C-SSRS RISK CATEGORY No Risk No Risk No Risk        Musculoskeletal  Strength & Muscle Tone: within normal limits Gait & Station: normal Patient leans: N/A  Psychiatric Specialty Exam  Presentation  General Appearance: Disheveled  Eye Contact:Good  Speech:Clear and Coherent  Speech Volume:Decreased  Handedness:Right   Mood and Affect  Mood:Anxious  Affect:Congruent   Thought Process  Thought Processes:Disorganized  Descriptions of Associations:Loose  Orientation:Full (Time, Place and Person)  Thought Content:WDL  Diagnosis of Schizophrenia or Schizoaffective disorder in past: Yes  Duration of Psychotic Symptoms: Greater than six months   Hallucinations:Hallucinations: Auditory Description of Auditory Hallucinations: "voices of people saying scary and funny things, people laughing at me"  Ideas of Reference:Paranoia  Suicidal Thoughts:Suicidal Thoughts: No  Homicidal Thoughts:Homicidal Thoughts: Yes, Passive HI Passive Intent and/or Plan: With Plan   Sensorium  Memory:Recent Fair; Remote Poor; Immediate Fair  Judgment:Poor  Insight:Poor   Executive Functions  Concentration:Fair  Attention Span:Fair  Recall:Fair  Fund of Knowledge:Fair  Language:Fair   Psychomotor Activity  Psychomotor Activity:Psychomotor Activity: Normal   Assets  Assets:Communication Skills; Desire for Improvement; Physical Health   Sleep  Sleep:Sleep: Fair Number of Hours of Sleep: 6   Nutritional Assessment (For OBS and [facility] admissions only) Has the patient had a weight loss or gain of 10 pounds or more in the last 3 months?: No Has the patient had a decrease in food intake/or appetite?: No Does the  patient have dental problems?: No Does the patient have eating habits or behaviors that may be indicators of an eating disorder including binging or inducing vomiting?: No Has the patient recently lost weight without trying?: 0 Has the patient been eating poorly because of a decreased appetite?: 0 Malnutrition Screening Tool Score: 0    Physical Exam  Physical Exam Vitals and nursing note reviewed.  Cardiovascular:     Rate and Rhythm: Normal rate and regular rhythm.  Neurological:     General: No focal deficit present.     Mental Status: He is oriented to person, place, and time.  Psychiatric:        Mood and Affect: Mood normal.        Behavior: Behavior normal.        Thought Content: Thought content normal.    Review of Systems  Eyes: Negative.   Cardiovascular: Negative.   Genitourinary: Negative.   Psychiatric/Behavioral:  Positive for depression. Negative for hallucinations and suicidal ideas. The patient is nervous/anxious.   All other systems reviewed and are negative.  Blood pressure 119/78, pulse 82, temperature 98 F (36.7 C), temperature source Oral, resp. rate 18, SpO2 98 %. There is no height or weight on file to calculate BMI.  Treatment Plan Summary: Daily contact with patient to assess and evaluate symptoms and progress in treatment and Medication management  Increase Abilify 5 mg to 7.5 mg daily CSW to follow-up with additional outpatient resources Patient to be reevaluated by psychiatry on 01/07/2022  Oneta Rack, NP 01/06/2022 3:58 PM

## 2022-01-06 NOTE — ED Notes (Signed)
Pt denied SI/HI/AVH. Stated he does not feel safe to leave because he lives in his car and where he has to park it at night it is unsafe. Stated, "I feel SI at that time because I am unsafe". Pt currently resting on the pull out chair/bed eating his breakfast. Accepted scheduled meds w/o difficulty. Safety maintained and will continue to monitor.

## 2022-01-06 NOTE — ED Notes (Signed)
Collected urine sample doe UDS. Pt was positive for THC. Unable to put results in echart. Safety maintained and will continue to monitor.

## 2022-01-07 ENCOUNTER — Encounter (HOSPITAL_COMMUNITY): Payer: Self-pay | Admitting: Registered Nurse

## 2022-01-07 DIAGNOSIS — F1510 Other stimulant abuse, uncomplicated: Secondary | ICD-10-CM | POA: Diagnosis present

## 2022-01-07 DIAGNOSIS — F19951 Other psychoactive substance use, unspecified with psychoactive substance-induced psychotic disorder with hallucinations: Secondary | ICD-10-CM | POA: Diagnosis present

## 2022-01-07 DIAGNOSIS — F1910 Other psychoactive substance abuse, uncomplicated: Secondary | ICD-10-CM | POA: Diagnosis present

## 2022-01-07 MED ORDER — HYDROXYZINE HCL 25 MG PO TABS: 25.0000 mg | ORAL_TABLET | Freq: Three times a day (TID) | ORAL | 0 refills | Status: DC | PRN

## 2022-01-07 MED ORDER — LORATADINE 10 MG PO TABS: 10.0000 mg | ORAL_TABLET | Freq: Every day | ORAL | 0 refills | Status: DC

## 2022-01-07 MED ORDER — TRAZODONE HCL 50 MG PO TABS
50.0000 mg | ORAL_TABLET | Freq: Every evening | ORAL | 0 refills | Status: DC | PRN
Start: 2022-01-07 — End: 2022-11-29

## 2022-01-07 MED ORDER — ARIPIPRAZOLE 15 MG PO TABS
7.5000 mg | ORAL_TABLET | Freq: Every day | ORAL | 0 refills | Status: DC
Start: 2022-01-08 — End: 2022-04-06

## 2022-01-07 NOTE — ED Notes (Signed)
Pt eating breakfast quietly.  Reports no pain, SI, HI and VH.  Pt does report AH but is currently not hearing anything.  He states the AH are sounds, voices, and music at times.  Breathing is even and unlabored.  Will continue to monitor for safety.

## 2022-01-07 NOTE — ED Provider Notes (Cosign Needed Addendum)
FBC/OBS ASAP Discharge Summary  Date and Time: 01/07/2022 10:51 AM  Name: Shane Small  MRN:  [id number]   Discharge Diagnoses:  Final diagnoses:  Paranoid schizophrenia (HCC)  Generalized anxiety disorder  Cannabis use disorder, severe, dependence (HCC)  Schizoaffective disorder, bipolar type (HCC)  Substance-induced psychotic disorder with hallucinations (HCC)  Methamphetamine use (HCC)    Subjective: Shane Small is a 36 year old male with a history of paranoid schizophrenia presented to [facility] via law enforcement with complaints of auditory hallucinations and worsening paranoia  Shane Small, 36 y.o., male patient seen face to face by this provider, consulted with Dr. Nelly Rout; and chart reviewed on 01/07/22.  On evaluation JAMON HAYHURST reports he is feeling better today.  States his main concern is being able to get his medications when he doesn't have any money.  Discussed follow up with [facility] walk in to establish medication management and counseling.  Would also give one week of samples and a one month prescriptions.  Will also give resources for community services that would be able to assist.  Today he denies suicidal/self-harm/homicidal ideation, psychosis, and paranoia.  States he is eating without difficulty and that his sleep has improved with medications.  States once he falls to sleep he sleeps through the night.  Reports he is tolerating medications without adverse reaction.   During evaluation HARKIRAT OROZCO is sitting on side of bed with no noted distress.  He is alert, oriented x 4, calm, cooperative and attentive.  His mood is euthymic with congruent affect.  He has normal speech, and behavior.  Objectively there is no evidence of psychosis/mania or delusional thinking.  Patient is able to converse coherently, goal directed thoughts, no distractibility, or pre-occupation.  He denies suicidal/self-harm/homicidal ideation, psychosis, and paranoia.  Patient  responded appropriately to questions.      Stay Summary: Shane Small was admitted for Schizophrenia, paranoid [facility]), crisis management, safety, and stabilization.  Medical problems were identified and treated as needed.  Home medications were restarted, adjusted, or new medications added as needed or appropriate.  Medications treated with during admission are as follows. Meds ordered this encounter  Medications   acetaminophen (TYLENOL) tablet 650 mg   alum & mag hydroxide-simeth (MAALOX/MYLANTA) 200-200-20 MG/5ML suspension 30 mL   magnesium hydroxide (MILK OF MAGNESIA) suspension 30 mL   hydrOXYzine (ATARAX) tablet 25 mg   traZODone (DESYREL) tablet 50 mg   DISCONTD: OLANZapine (ZYPREXA) tablet 5 mg   DISCONTD: ARIPiprazole (ABILIFY) tablet 5 mg   ARIPiprazole (ABILIFY) tablet 7.5 mg   loratadine (CLARITIN) tablet 10 mg   ARIPiprazole (ABILIFY) 15 MG tablet    Sig: Take 0.5 tablets (7.5 mg total) by mouth daily.    Dispense:  30 tablet    Refill:  0    Order Specific Question:   Supervising Provider    Answer:   Nelly Rout [[id number]]   hydrOXYzine (ATARAX) 25 MG tablet    Sig: Take 1 tablet (25 mg total) by mouth 3 (three) times daily as needed for anxiety.    Dispense:  30 tablet    Refill:  0    Order Specific Question:   Supervising Provider    Answer:   Nelly Rout [[id number]]   traZODone (DESYREL) 50 MG tablet    Sig: Take 1 tablet (50 mg total) by mouth at bedtime as needed for sleep.    Dispense:  30 tablet    Refill:  0  Order Specific Question:   Supervising Provider    Answer:   Nelly Rout [[id number]]   loratadine (CLARITIN) 10 MG tablet    Sig: Take 1 tablet (10 mg total) by mouth daily.    Dispense:  30 tablet    Refill:  0    Order Specific Question:   Supervising Provider    Answer:   Nelly Rout [[id number]]   Labs ordered for review: Lab Orders         Resp Panel by RT-PCR (Flu A&B, Covid) Anterior Nasal Swab         CBC with Differential/Platelet          Comprehensive metabolic panel         Hemoglobin A1c         Ethanol         Lipid panel         POCT Urine Drug Screen - (I-Screen)         POC SARS Coronavirus 2 Ag     Improvement was monitored by observation and Shane Small 's daily verbal report emotional status and symptom reduction along with clinical staff report.  Shane Small was evaluated by the treatment team for stability and plans for continued recovery upon discharge. Shane Small 's motivation was an integral factor for scheduling further treatment. Employment, transportation, bed availability, health status, family support, and any pending legal issues were also considered during stay. He was offered further treatment options upon discharge including but not limited to Residential, Intensive Outpatient, and Outpatient treatment.   Shane Small will follow up with   Follow-up Information     Go to  [facility].   Specialty: Urgent Care Why: Follow up during walk in hours for intake medicaiton management and counseling Contact information: [address] [place] [address] [phone]                 Discharge Instructions       [facility]: Outpatient psychiatric Services  New Patient Assessment and Therapy Walk-in Monday thru Thursday 8:00 am first come first serve until slots are full Every Friday from 1:00 pm to 4:00 pm first come first serve until slots are full  New Patient Psychiatric Medication Management Monday thru Friday from 8:00 am to 11:00 am first come first served until slots are full  For all walk-ins we ask that you arrive by 7:15 am because patients will be seen in there order of arrival.   Availability is limited, and therefore you may not be seen on the same day that you walk in.  Our goal is to serve and meet the needs of our community to the best of our ability.      Interactive Resource  Center  Hours Monday - Friday: Services: 8:00AM - 3:00PM Offices: 8:00AM - 5:00PM  Physical Address [address] [place], [place] [address]   Please use this address for [facility] Mailing Address [address] [address] [place], [place] [address]  [facility] helps people reconnect This is a safe place to rest, take care of basic needs and access the services and community that make all the difference. Our guests come to the [facility] to take a class, do laundry, meet with a case manager or to get their mail. Sometimes they just need to sit in our dayroom and enjoy a conversation.  Here you will find everything from shower facilities to a computer lab, a  mail room, classrooms and meeting spaces.  The IRC helps people reconnect with their own lives and with the community at large.  A caring community setting One of the most exciting aspects of the IRC is that so many individuals and organizations in the community are a part of the everyday experience. Whether it's a hair stylist or law firm offering services right in-house, our partners make the [facility] a truly interactive resource center where services are brought to our guests. The IRC brings together a comprehensive community of talented people who not only want to help solve problems, but also to be a part of our guests' lives.  Integrated Care We take a person-centered approach to assistance that includes: Case management [address] [employer] [facility] Mental health nurse Referrals  Fundamental Services We start with necessities: Showers and Armed forces operational officer addresses and mailboxes Replacement IDs Onsite barbershop Storage lockers [place] winter warming center  Self-Sufficiency We connect our guests with: Skilled trade classes Job skills classes Resume and jobs application assistance Interview training GED Psychologist, sport and exercise vouchers Financial literacy    [employer] Address: [address]  [place], [place], [place] [address] Phone: [phone]  Supported Employment The supported employment program is a person-centered, individualized, evidence-based support service that helps members choose, acquire, and maintain competitive employment in our community. This service supports the varying needs of individuals and promotes community inclusion and employment success. Members enrolled in the supported employment program can expect the following:  Development of an individual career plan Community based job placement Job shadowing Job development On-site job Furniture conservator/restorer and support  Supported Education Supported education helps our members receive the education and training they need to achieve their learning and recovery goals. This will assist members with becoming gainfully employed in the job or career of their choice. The program includes assistance with: Registering for disability accommodations Enrolling in school and registering for classes Learning communication skills Scheduling tutoring sessions within your school [facility] partners with Vocational Rehabilitation to help increase the success of clients seeking employment and educational goals.  Want to learn more about our programs?   Please contact our intake department INTAKE: [phone] Ext [phone]  Mailing: [address] [address]   [place], [place] [address]   [email or web address]     Substance Abuse Treatment Programs  Intensive Outpatient Programs [facility]     [address]      [place], [place]                   [phone]       [facility] [address] [place], [place] [phone]  [facility]     (Inpatient and outpatient)     [address]           [phone]    [facility] [phone] (Suboxone and Methadone)  [address]      [place], [place] [address]       [phone]       [address] [place], [place] [phone]  [facility] (Outpatient/Inpatient, Chemical)    (insurance only) [phone]             [facility] (Groups & Residential) [place], [place] [phone]     [facility]     [address]     [place], [place]      [phone]  [facility] (for caregivers and family) [address] [place], [place] [phone]      Residential Treatment Programs [facility]      [address] [place], [place] [address]  [phone]       [facility] [address], [place], [place] [address] [phone]  [facility] [place]        [phone]       [facility] [phone]  [facility]             [address]                                         [place], [place]                                                [phone] or [phone]                               [facility] [address] [place], [address] [phone]  [facility]    [address]      [place], [place]     [phone]       [facility] [address] [place], [place] [phone]  [facility]   [address] [place], [place] [address]     [phone]      Admissions: 8am-3pm M-F  Residential Treatment Services (RTS) [address] [place], [place] [phone]  [facility] Program: Residential Program [phone] Days)   [place], [place]      [phone] or [phone]     [facility]: [facility] [place], [place] (Walk in Hours over the weekend or by referral)  [facility] [address] [place], [place], [place] [address] [phone]  Crisis Mobile: [facility] Alternatives:  [phone] (for crisis response 24 hours a day) [facility] Hotline:      [phone] Outpatient Psychiatry and Counseling  [facility]:  Mobile Crisis Management 24 hours:  [phone]  [facility] [employer] sliding scale fee and walk in schedule: M-F 8am-12pm/1pm-3pm [address]  [place], [place] [address] [phone]  [facility] [address] [place], [place] [address] [phone]  [facility] (Formerly known as [facility] [employer])- new patient walk-in appointments available Monday - Friday 8am -3pm.          [address] [place], [place] [address] [phone] or crisis line- [phone]  [facility] Health Outpatient Services/ Intensive Outpatient Therapy Program [address] [place], [place] [address] [phone]  [facility]                  Crisis Services      [phone]     [place], [place] [address]                 [facility]   [facility] [phone]. [address] [place], [place] [address]   [facility]          [address],  [place], [place] [address]       [phone]  [facility] [address] [place], [place] [address] [phone]  [facility]    [address]    [place], [place] [address]     [phone]       Andee Poles, MD     [address]     [place] [address]     [phone]       [facility] [address] [place] [address]  [facility]     [address] [place], [place]      [phone]       [facility] Eulogio Ditch, MD [address] [place], [place] [address] [phone]  [facility]     [address]     [place], [place] [address]     [phone]       [facility] [address] [place], [place] [address] [phone] Resources for Temporary Residential Assistance/Crisis Centers  DAY CENTERS Interactive  Resource Center [facility]) M-F 8am-3pm   [address] [place], [place] [address]   [phone] Services include: laundry, barbering, support groups, case management, phone  & computer access, showers, AA/NA mtgs, mental health/substance abuse nurse, job skills class, disability information, VA assistance, spiritual classes, etc.   HOMELESS SHELTERS  [facility] Ministry     [facility]   [address], [place]     [phone]              [employer] (women and children)       [address] [place], [place] [address] [phone] [email or web address] for application and process Application Required  [facility]   [address]    [place] [address]     [phone]                    [facility] [address] [place], [place] [address] [phone] application appt.) Application Required  [facility] (women only)    [address]     [place], [place] [address]     [phone]      Intake starts 6pm daily Need valid ID, SSC, & Police report [employer] [address] [place], [place] [phone] Application Required  [employer] (men only)     [address]      [place], [place]     [phone]       [facility] (Pregnant women only) [address] [place], [place] [phone]  [facility]      [address]      [place], [place] [address]     [phone]             [facility] [address] [place], [place] [phone] 90 day commitment/SA/Application process  [facility](men only)     [address]     [place], [place]     [phone]       Check-in at [facility] [address] [place], [place] [address] [phone] Men/Women/Women and Children must be there by 7 pm  [facility] [place],  [place] [phone]  Upon completion of this admission AUDRIC VENN was both mentally and medically stable for discharge denying suicidal/homicidal ideation, auditory/visual/tactile hallucinations, delusional thoughts, and paranoia.    Total Time spent with patient: 30 minutes  Past Psychiatric History: See below Past Medical History:  Past Medical History:  Diagnosis Date   Anxiety    Asthma    Daily headache    Depression    Paranoid schizophrenia (HCC)    Schizophrenia (HCC)    Stroke (HCC)     Past Surgical History:  Procedure Laterality Date   NO PAST SURGERIES     Family History:  Family History  Problem Relation Age of Onset   Asthma Mother    Diabetes Father    Hypertension Father    Colon cancer Father 9   Family Psychiatric History: None reported Social History:  Social History   Substance and Sexual Activity  Alcohol Use Yes   Alcohol/week: 56.0 standard drinks of alcohol   Types: 28 Cans of beer, 28 Shots of liquor per week   Comment: occasionally     Social History   Substance and Sexual Activity  Drug Use Yes   Comment: CBD    Social History   Socioeconomic History   Marital status: Single    Spouse name: Not on file   Number of children: 1   Years of education: Not on file   Highest education level: Not on file  Occupational History   Occupation: Location manager  Tobacco Use   Smoking status: Every Day    Types: Cigarettes   Smokeless tobacco: Never  Vaping Use   Vaping Use: Every day  Substance and Sexual Activity   Alcohol use: Yes    Alcohol/week: 56.0 standard drinks of alcohol    Types: 28 Cans of beer, 28 Shots of liquor per week    Comment: occasionally   Drug use: Yes    Comment: CBD   Sexual activity: Not on file  Other Topics Concern   Not on file  Social History Narrative   Not on file   Social Determinants of Health   Financial Resource Strain: Not on file  Food Insecurity: Not on file   Transportation Needs: Not on file  Physical Activity: Not on file  Stress: Not on file  Social Connections: Not on file   SDOH:  SDOH Screenings   Alcohol Screen: Low Risk  (03/06/2017)  Depression (PHQ2-9): Low Risk  (01/07/2022)  Tobacco Use: High Risk (12/27/2021)    Tobacco Cessation:  A prescription for an FDA-approved tobacco cessation medication was offered at discharge and the patient refused  Current Medications:  Current Facility-Administered Medications  Medication Dose Route Frequency Provider Last Rate Last Admin   acetaminophen (TYLENOL) tablet 650 mg  650 mg Oral Q6H PRN Ajibola, Ene A, NP       alum & mag hydroxide-simeth (MAALOX/MYLANTA) 200-200-20 MG/5ML suspension 30 mL  30 mL Oral Q4H PRN Ajibola, Ene A, NP       ARIPiprazole (ABILIFY) tablet 7.5 mg  7.5 mg Oral Daily Hillery Jacks N, NP   7.5 mg at 01/07/22 0902   hydrOXYzine (ATARAX) tablet 25 mg  25 mg Oral TID PRN Ajibola, Ene A, NP   25 mg at 01/06/22 2056   loratadine (CLARITIN) tablet 10 mg  10 mg Oral Daily White, Patrice L, NP   10 mg at 01/07/22 0902   magnesium hydroxide (MILK OF MAGNESIA) suspension 30 mL  30 mL Oral Daily PRN Ajibola, Ene  A, NP       traZODone (DESYREL) tablet 50 mg  50 mg Oral QHS PRN Ajibola, Ene A, NP   50 mg at 01/06/22 2056   Current Outpatient Medications  Medication Sig Dispense Refill   [START ON 01/08/2022] ARIPiprazole (ABILIFY) 15 MG tablet Take 0.5 tablets (7.5 mg total) by mouth daily. 30 tablet 0   hydrOXYzine (ATARAX) 25 MG tablet Take 1 tablet (25 mg total) by mouth 3 (three) times daily as needed for anxiety. 30 tablet 0   [START ON 01/08/2022] loratadine (CLARITIN) 10 MG tablet Take 1 tablet (10 mg total) by mouth daily. 30 tablet 0   traZODone (DESYREL) 50 MG tablet Take 1 tablet (50 mg total) by mouth at bedtime as needed for sleep. 30 tablet 0    PTA Medications: (Not in a hospital admission)      01/07/2022   10:48 AM 04/08/2020   10:14 AM  Depression screen  PHQ 2/9  Decreased Interest 0 2  Down, Depressed, Hopeless 1 1  PHQ - 2 Score 1 3  Altered sleeping 1 1  Tired, decreased energy 0 2  Change in appetite 0 2  Feeling bad or failure about yourself  1 1  Trouble concentrating 1 3  Moving slowly or fidgety/restless 0 1  Suicidal thoughts 0 0  PHQ-9 Score 4 13  Difficult doing work/chores Somewhat difficult Somewhat difficult    Flowsheet Row ED from 01/05/2022 in [facility] ED from 01/01/2022 in [facility]ENCY [facility] ED from [facility]/27/2021 in [facility]  C-SSRS RISK CATEGORY No Risk No Risk No Risk       Musculoskeletal  Strength & Muscle Tone: within normal limits Gait & Station: normal Patient leans: N/A  Psychiatric Specialty Exam  Presentation  General Appearance: Appropriate for Environment  Eye Contact:Good  Speech:Clear and Coherent; Normal Rate  Speech Volume:Normal  Handedness:Right   Mood and Affect  Mood:Euthymic  Affect:Appropriate; Congruent   Thought Process  Thought Processes:Coherent; Goal Directed  Descriptions of Associations:Intact  Orientation:Full (Time, Place and Person)  Thought Content:WDL  Diagnosis of Schizophrenia or Schizoaffective disorder in past: Yes  Duration of Psychotic Symptoms: Greater than six months   Hallucinations:Hallucinations: None  Ideas of Reference:None  Suicidal Thoughts:Suicidal Thoughts: No  Homicidal Thoughts:Homicidal Thoughts: No   Sensorium  Memory:Immediate Good; Recent Good  Judgment:Intact  Insight:Present   Executive Functions  Concentration:Good  Attention Span:Good  Recall:Good  Fund of Knowledge:Good  Language:Good   Psychomotor Activity  Psychomotor Activity:Psychomotor Activity: Normal   Assets  Assets:Communication Skills; Desire for Improvement; Resilience; Physical Health; Leisure Time   Sleep  Sleep:Sleep: Fair (Reports improved  with medications.  Once sleep able to sleep through night)   Nutritional Assessment (For OBS and [facility] admissions only) Has the patient had a weight loss or gain of 10 pounds or more in the last 3 months?: No Has the patient had a decrease in food intake/or appetite?: No Does the patient have dental problems?: No Does the patient have eating habits or behaviors that may be indicators of an eating disorder including binging or inducing vomiting?: No Has the patient recently lost weight without trying?: 0 Has the patient been eating poorly because of a decreased appetite?: 0 Malnutrition Screening Tool Score: 0    Physical Exam  Physical Exam Vitals and nursing note reviewed. Exam conducted with a chaperone present.  Constitutional:      General: He is not in acute distress.  Appearance: Normal appearance. He is not ill-appearing.  Eyes:     Pupils: Pupils are equal, round, and reactive to light.  Cardiovascular:     Rate and Rhythm: Normal rate.  Pulmonary:     Effort: Pulmonary effort is normal.  Musculoskeletal:        General: Normal range of motion.     Cervical back: Normal range of motion.  Skin:    General: Skin is warm and dry.  Neurological:     Mental Status: He is alert and oriented to person, place, and time.  Psychiatric:        Attention and Perception: Attention and perception normal. He does not perceive auditory or visual hallucinations.        Mood and Affect: Mood and affect normal.        Speech: Speech normal.        Behavior: Behavior normal. Behavior is cooperative.        Thought Content: Thought content normal. Thought content is not paranoid or delusional. Thought content does not include homicidal or suicidal ideation.        Cognition and Memory: Cognition normal.        Judgment: Judgment normal.    Review of Systems  Constitutional: Negative.   HENT: Negative.    Eyes: Negative.   Respiratory: Negative.    Cardiovascular: Negative.    Gastrointestinal: Negative.   Genitourinary: Negative.   Musculoskeletal: Negative.   Skin: Negative.   Neurological: Negative.   Endo/Heme/Allergies: Negative.   Psychiatric/Behavioral:  Positive for substance abuse (Polysubstance use). Negative for memory loss. Depression: Stable. Hallucinations: Denies. Suicidal ideas: Denies.Nervous/anxious: Stable. Insomnia: Improved.    Blood pressure 113/88, pulse 67, temperature 98.1 F (36.7 C), temperature source Oral, resp. rate 16, SpO2 100 %. There is no height or weight on file to calculate BMI.  Demographic Factors:  Male, Low socioeconomic status, Living alone, and Unemployed  Loss Factors: Homelessness  Historical Factors: Polysubstance use disorder  Risk Reduction Factors:   Religious beliefs about death  Continued Clinical Symptoms:  Alcohol/Substance Abuse/Dependencies  Cognitive Features That Contribute To Risk:  None    Suicide Risk:  Minimal: No identifiable suicidal ideation.  Patients presenting with no risk factors but with morbid ruminations; may be classified as minimal risk based on the severity of the depressive symptoms  Plan Of Care/Follow-up recommendations:  Other:  Follow up with resources given  Current Outpatient Medications  Medication Instructions   [START ON 01/08/2022] ARIPiprazole (ABILIFY) 7.5 mg, Oral, Daily   hydrOXYzine (ATARAX) 25 mg, Oral, 3 times daily PRN   [START ON 01/08/2022] loratadine (CLARITIN) 10 mg, Oral, Daily   traZODone (DESYREL) 50 mg, Oral, At bedtime PRN   Disposition: No evidence of imminent risk to self or others at present.   Patient does not meet criteria for psychiatric inpatient admission. Supportive therapy provided about ongoing stressors. Discussed crisis plan, support from social network, calling 911, coming to the Emergency Department, and calling Suicide Hotline.   Caramia Boutin, NP 01/07/2022, 10:51 AM

## 2022-01-07 NOTE — ED Notes (Signed)
Pt sleeping@this time. Breathing even and unlabored. Will continue to monitor for safety 

## 2022-01-07 NOTE — Discharge Instructions (Addendum)
[facility]: Outpatient psychiatric Services  New Patient Assessment and Therapy Walk-in Monday thru Thursday 8:00 am first come first serve until slots are full Every Friday from 1:00 pm to 4:00 pm first come first serve until slots are full  New Patient Psychiatric Medication Management Monday thru Friday from 8:00 am to 11:00 am first come first served until slots are full  For all walk-ins we ask that you arrive by 7:15 am because patients will be seen in there order of arrival.   Availability is limited, and therefore you may not be seen on the same day that you walk in.  Our goal is to serve and meet the needs of our community to the best of our ability.      Interactive Resource Center  Hours Monday - Friday: Services: 8:00AM - 3:00PM Offices: 8:00AM - 5:00PM  Physical Address [address] [place], [place] [address]   Please use this address for [facility] Mailing Address [address] [address] [place], [place] [address]  The [facility] helps people reconnect This is a safe place to rest, take care of basic needs and access the services and community that make all the difference. Our guests come to the [facility] to take a class, do laundry, meet with a case manager or to get their mail. Sometimes they just need to sit in our dayroom and enjoy a conversation.  Here you will find everything from shower facilities to a computer lab, a mail room, classrooms and meeting spaces.  The IRC helps people reconnect with their own lives and with the community at large.  A caring community setting One of the most exciting aspects of the IRC is that so many individuals and organizations in the community are a part of the everyday experience. Whether it's a hair stylist or law firm offering services right in-house, our partners make the [facility] a truly interactive resource center where services are brought to our guests. The IRC brings together a comprehensive community of talented people who  not only want to help solve problems, but also to be a part of our guests' lives.  Integrated Care We take a person-centered approach to assistance that includes: Case management [address] [employer] [facility] Mental health nurse Referrals  Fundamental Services We start with necessities: Showers and Armed forces operational officer addresses and mailboxes Replacement IDs Onsite barbershop Storage lockers [place] winter warming center  Self-Sufficiency We connect our guests with: Skilled trade classes Job skills classes Resume and jobs application assistance Interview training GED Psychologist, sport and exercise vouchers Financial literacy    [employer] Address: [address] [place], [place], [place] [address] Phone: [phone]  Supported Employment The supported employment program is a person-centered, individualized, evidence-based support service that helps members choose, acquire, and maintain competitive employment in our community. This service supports the varying needs of individuals and promotes community inclusion and employment success. Members enrolled in the supported employment program can expect the following:  Development of an individual career plan Community based job placement Job shadowing Job development On-site job Furniture conservator/restorer and support  Supported Education Supported education helps our members receive the education and training they need to achieve their learning and recovery goals. This will assist members with becoming gainfully employed in the job or career of their choice. The program includes assistance with: Registering for disability accommodations Enrolling in school and registering for classes Learning communication skills Scheduling tutoring sessions within your school [facility] partners  with Vocational Rehabilitation to help increase the success of clients seeking employment and  educational goals.  Want to learn more about our programs?   Please contact our intake department INTAKE: [phone] Ext [phone]  Mailing: [address] [address]   [place], [place] [address]   [email or web address]     Substance Abuse Treatment Programs  Intensive Outpatient Programs [facility]     [address]      [place], [place]                   [phone]       [facility] [address] [place] [address] [place], [place] [phone]  [facility]     (Inpatient and outpatient)     [address]           [phone]    [facility] [phone] (Suboxone and Methadone)  [address]      [place], [place] [address]      [phone]       [address] [place], [place] [phone]  [facility] (Outpatient/Inpatient, Chemical)    (insurance only) [phone]             [facility] (Groups & Residential) [place], [place] [phone]     [facility]     [address]     [place], [place]      [phone]       [facility] (for caregivers and family) [phone] [place], [place] [phone]      Residential Treatment Programs [facility]      [address] [place], [place] [address]  [phone]       [facility] [address], [place], [place] [address] [phone]  [facility] [place]        [phone]       [facility] [phone]  [facility] (Addiction Recovery [facility]             [address] [place] [address]                                         [place], [place]                                                [phone] or [phone]                               [facility] [place] [address] [place], [address] [phone]  [facility]    [address]      [place], [place]     [phone]       The [facility] [address] [place],  [place] [phone]  [facility]   [address] [place], [place] [address]     [phone]      Admissions: 8am-3pm M-F  Residential Treatment Services (RTS) [address] [place], [place] [phone]  [facility]: Residential Program [phone] Days)   [place], [place]      [phone] or [phone]     [facility]: [facility] [place], [place] (Walk in  Hours over the weekend or by referral)  [facility] [address] [place], [place], [place] [address] [phone]  Crisis Mobile: [facility]atives:  [phone] (for crisis response 24 hours a day) [facility] Hotline:      [phone] Outpatient Psychiatry and Counseling  [facility]: Mobile Crisis Management 24 hours:  [phone]  [facility] of the [employer] sliding scale fee and walk in schedule: M-F 8am-12pm/1pm-3pm [address]  [place], [place] [address] [phone]  [facility] [address] [place], [place] [address] [phone]  [facility] (Formerly [facility] as [facility] [employer])- new patient walk-in appointments available Monday - Friday 8am -3pm.          [address] [place], [place] [address] [phone] or crisis line- [phone]  [facility] Outpatient Services/ Intensive Outpatient Therapy Program [address] [place], [place] [address] [phone]  [facility]                  Crisis Services      [phone]     [place], [place] [address]                 [facility]   [facility] [phone]. [address] [place], [place] [address]   [facility]          [address]  [place], [place] [address]       [phone]  [facility] [address] [place], [place] [address] [phone]  [facility]    [address]    [place], [place] [address]     [phone]       Andee Poles, MD     [address]     [place] [address]     [phone]       [facility] [address] [place] [address]  [facility]     [address] [place], [place]      [phone]       [facility] Eulogio Ditch, MD [address] [place], [place] [address] [phone]  [facility]     [address]     [place], [place] [address]     [phone]       [facility] [address] [place], [place] [address] [phone] Resources for Temporary Residential Assistance/Crisis Centers  DAY CENTERS [facility]) M-F 8am-3pm   [address] [place], [place] [address]   [phone] Services include: laundry, barbering, support groups, case management, phone  & computer access, showers, AA/NA mtgs, mental health/substance abuse nurse, job skills class, disability information, VA assistance, spiritual classes, etc.   HOMELESS SHELTERS  [facility]     [facility]   [address], [place]     [phone]              [employer] (women and children)       [address] [place], [place] [address] [phone] [email or web address] for application and process Application Required  [facility]   [address]    [place] [address]     [phone]  [employer] [facility] [address] [place], [place] [address] [phone] application appt.) Application Required  [facility] (women only)    [address]     [place], [place] [address]     [phone]      Intake starts 6pm daily Need valid ID, SSC, & Police report [employer] [address] [place], [place] [phone] Application  Required  [employer] (men only)     [address]      [place], [place]     [phone]       [facility] of the [place] (Pregnant women only) [address] [place], [place] [phone]  [facility]      [address]      [place], [place] [address]     [phone]             [facility] [address] [place], [place] [phone] 90 day commitment/SA/Application process  [facility](men only)     [address]     [place], [place]     [phone]       Check-in at [facility] [address] [place], [place] [address] [phone] Men/Women/Women and Children must be there by 7 pm  [facility] [place], [place] [phone]

## 2022-01-08 ENCOUNTER — Ambulatory Visit (HOSPITAL_COMMUNITY)
Admission: RE | Admit: 2022-01-08 | Discharge: 2022-01-08 | Disposition: A | Discharge status: Home / Self Care | Payer: Self-pay | Attending: Psychiatry | Admitting: Psychiatry

## 2022-01-08 NOTE — H&P (Signed)
Behavioral Health Medical Screening Exam  Shane Small is a 36 y.o. male  with psychiatric history of Paranoid schizophrenia, GAD, cannabis use disorder, severe dependence, schizoaffective disorder, bipolar type, substance-induced psychotic disorder with hallucinations,and methamphetamine use, who presented voluntarily as a walk-in to [facility] with primary complaint of homelessness.     Pt was seen face to face by this provider.  Pt reports " I feel really scared being on the streets, It's people that come around me that make me feel uncomfortable, and I don't have anywhere to stay".  Pt reports " I planned to go to the interactive center on [address] earlier, but I forgot and it got too late, and they are closed now". Pt reports "can I stay here for the night, I plan on going to the interactive center tomorrow".   Per chart review, "the patient was at the [facility]  01/05/22-01/06/22 with complaints of worsening paranoia and auditory hallucination, endorsing compliance with his medication for the past 2 years, and requesting to be started back on Abilify.  Per chart review, patient was seen at [facility] on 12-29-2021 by psychiatric provider, Nira Conn, NP.  At that time patient was restarted back on olanzapine 5 mg/day and recommended to follow-up with outpatient psychiatric provider. At the [facility], Patient reported that he  has not been taking olanzapine since [facility]; he says he does not want to take olanzapine and he is requesting to be placed on Abilify. At discharge, patient was given samples of his medication (Abilify) and prescription refills. The provider also discussed follow up with [facility] walk in to establish medication management and counseling.  Pt was also given one week of samples and a one month prescriptions, in addition to resources for community services that would be able to assist".   On his recent  [facility] visit and follow-up as recommended, Pt states " they gave me  meds, but I'm not taking it because I feel like it don't work, and I don't wanna return over there, because I feel like they not helping me".   Pt educated on benefits of medication compliance, taking his medications as prescribed, and encouraged to follow up with the recommendations as provided. Pt verbalized his understanding.  Pt denies SI/HI/AVH. Pt denies illicit drug use.  Pt also denies chest pain or discomfort. Pt reports his sleep and appetite as "ok". Pt denies access to gun/weapons. Pt reports he is homeless and has no relatives or friends in the area.  Support, encouragement and reassurance provided about ongoing stressors. Pt provided with opportunity for questions.    On evaluation, patient is alert, oriented x 3, and cooperative. Speech is clear, coherent and logical. Pt appears disheveled. Eye contact is fair. Mood is euthymic, affect is congruent with mood. Thought process and thought content is coherent. Pt denies SI/HI/AVH. There is no indication that the patient is responding to internal stimuli. No delusions elicited during this assessment.     Total Time spent with patient: 20 minutes  Psychiatric Specialty Exam:  Presentation  General Appearance: Disheveled  Eye Contact:Fair  Speech:Clear and Coherent  Speech Volume:Normal  Handedness:Right   Mood and Affect  Mood:Euthymic  Affect:Congruent   Thought Process  Thought Processes:Coherent; Goal Directed  Descriptions of Associations:Intact  Orientation:Full (Time, Place and Person)  Thought Content:WDL  History of Schizophrenia/Schizoaffective disorder:Yes  Duration of Psychotic Symptoms:Greater than six months  Hallucinations:Hallucinations: None  Ideas of Reference:None  Suicidal Thoughts:Suicidal Thoughts: No  Homicidal Thoughts:Homicidal Thoughts: No  Sensorium  Memory:Immediate Good; Recent Good  Judgment:Fair  Insight:Present   Executive Functions   Concentration:Good  Attention Span:Good  Recall:Good  Fund of Knowledge:Good  Language:Good   Psychomotor Activity  Psychomotor Activity:Psychomotor Activity: Normal   Assets  Assets:Communication Skills; Desire for Improvement; Leisure Time   Sleep  Sleep:Sleep: Fair    Physical Exam: Physical Exam Constitutional:      General: He is not in acute distress.    Appearance: He is normal weight. He is not diaphoretic.  HENT:     Head: Normocephalic.     Right Ear: External ear normal.     Left Ear: External ear normal.     Nose: No congestion.  Eyes:     General:        Right eye: No discharge.        Left eye: No discharge.  Pulmonary:     Effort: No respiratory distress.  Chest:     Chest wall: No tenderness.  Neurological:     Mental Status: He is alert and oriented to person, place, and time.  Psychiatric:        Attention and Perception: Attention and perception normal.        Mood and Affect: Mood and affect normal.        Speech: Speech normal.        Behavior: Behavior is cooperative.        Thought Content: Thought content normal. Thought content is not paranoid or delusional. Thought content does not include homicidal or suicidal ideation. Thought content does not include homicidal or suicidal plan.        Cognition and Memory: Cognition and memory normal.        Judgment: Judgment normal.    Review of Systems  Constitutional:  Negative for chills, diaphoresis and malaise/fatigue.  HENT:  Negative for congestion.   Eyes:  Negative for discharge.  Respiratory:  Negative for cough, shortness of breath and wheezing.   Cardiovascular:  Negative for chest pain and palpitations.  Gastrointestinal:  Negative for diarrhea, nausea and vomiting.  Neurological:  Negative for seizures, loss of consciousness, weakness and headaches.  Psychiatric/Behavioral:  Negative for depression, hallucinations, substance abuse and suicidal ideas. The patient is not  nervous/anxious and does not have insomnia.    Blood pressure 133/74, pulse (!) 101, temperature 99.2 F (37.3 C), temperature source Oral, SpO2 99 %. There is no height or weight on file to calculate BMI.  Musculoskeletal: Strength & Muscle Tone: within normal limits Gait & Station: normal Patient leans: N/A  [place] Scale:  Flowsheet Row [facility] ED from 01/01/2022 in [facility] EMERGENCY DEPARTMENT  C-SSRS RISK CATEGORY No Risk No Risk No Risk       Recommendations:  Based on my evaluation the patient does not appear to have an emergency medical condition.   Recommend discharge, and Pt provided with homeless resources and area shelters. Discussed follow up with outpatient psychiatric services. Pt verbalized his understanding.   Discussed crisis plan, calling [phone] or going to the ED if condition worsens. Pt verbalized his understanding.  Pt does not meet inpatient psychiatric admission criteria or IVC criteria. There is no evidence of imminent risk of harm to self or others at present.   Please refrain from using alcohol or illicit substances, as they can affect your mood and can cause depression, anxiety or other concerning  symptoms.  Alcohol can increase the chance that a person will make reckless decisions, like attempting suicide, and can increase the lethality of a drug overdose.   Disp- Pt discharged and condition at discharge is stable.     Mancel Bale, NP 01/08/2022, 8:16 PM

## 2022-01-09 ENCOUNTER — Encounter (HOSPITAL_COMMUNITY): Payer: Self-pay | Admitting: Registered Nurse

## 2022-01-09 ENCOUNTER — Ambulatory Visit (HOSPITAL_COMMUNITY)
Admission: EM | Admit: 2022-01-09 | Discharge: 2022-01-09 | Disposition: A | Discharge status: Home / Self Care | Payer: No Payment, Other | Attending: Registered Nurse | Admitting: Registered Nurse

## 2022-01-09 DIAGNOSIS — Z5982 Transportation insecurity: Secondary | ICD-10-CM | POA: Diagnosis present

## 2022-01-09 DIAGNOSIS — R443 Hallucinations, unspecified: Secondary | ICD-10-CM | POA: Insufficient documentation

## 2022-01-09 DIAGNOSIS — F1220 Cannabis dependence, uncomplicated: Secondary | ICD-10-CM | POA: Diagnosis present

## 2022-01-09 DIAGNOSIS — F1510 Other stimulant abuse, uncomplicated: Secondary | ICD-10-CM | POA: Diagnosis present

## 2022-01-09 DIAGNOSIS — F200 Paranoid schizophrenia: Secondary | ICD-10-CM | POA: Diagnosis present

## 2022-01-09 DIAGNOSIS — Z5902 Unsheltered homelessness: Secondary | ICD-10-CM | POA: Insufficient documentation

## 2022-01-09 DIAGNOSIS — F1910 Other psychoactive substance abuse, uncomplicated: Secondary | ICD-10-CM | POA: Diagnosis present

## 2022-01-09 DIAGNOSIS — F19951 Other psychoactive substance use, unspecified with psychoactive substance-induced psychotic disorder with hallucinations: Secondary | ICD-10-CM | POA: Diagnosis present

## 2022-01-09 DIAGNOSIS — G4700 Insomnia, unspecified: Secondary | ICD-10-CM | POA: Insufficient documentation

## 2022-01-09 DIAGNOSIS — F411 Generalized anxiety disorder: Secondary | ICD-10-CM | POA: Diagnosis present

## 2022-01-09 NOTE — Progress Notes (Signed)
   01/09/22 1209  [facility] Triage Screening (Walk-ins at [facility] only)  How Did You Hear About [place]? Self  What Is the Reason for Your Visit/Call Today? Pt is a 36 yo male who called the police to come voluntarily and unaccompnaied to the [facility] due to continuning paranoia. Pt stated he has a hx of schiuzophrenia and has been psychiatrically hospitalized twice in the past as a result. Pt stated his last hospitalization was about 3-4 years ago. Pt stated that "years ago" he drank some bleach in his coffee because voices told him to do so. Pt denied it was a suicide attempt. Pt stated he "felt like he was going to get shot at" and stated that "it happens everywhere I go." Pt stated he does not know of any reason or any specific person/people that would want to shoot him. Pt added that he "always thinks something bad is going to happen" and gets scared. Pt denied SI, HI and NSSH. Pt reported daily use of alcohol in small amounts but stated he has not had any alcohol now for 2-3 days. Pt did not appear to be intoxicated or any signs of withdrawal. Pt reported caffiene and nicotine use daily but denied any other substance use. Pt reported he was homeless for the last week or two since his father and his stepmother asked him to leave their home where he was staying over a disagreement about food. Pt stated he has been living in his car primarily sine that time. Pt stated that now his car "won't crank." Pt stated he was working until recently when he left work due to feeling anxious in the environment and also, not passing a background check.  How Long Has This Been Causing You Problems? > than 6 months  Have You Recently Had Any Thoughts About Hurting Yourself? No  Are You Planning to Commit Suicide/Harm Yourself At This time? No  Have you Recently Had Thoughts About Hurting Someone Karolee Ohs? No  Are You Planning To Harm Someone At This Time? No  Are you currently experiencing any auditory, visual or other hallucinations? Yes   Please explain the hallucinations you are currently experiencing: ongoing  Have You Used Any Alcohol or Drugs in the Past 24 Hours? No  Do you have any current medical co-morbidities that require immediate attention? No  Clinician description of patient physical appearance/behavior: Pt was calm, cooperative, alert and seemed fully oriented. Pt did not appear to be responding to internal stimuli, experiencing delusional thinking or to be intoxicated. Pt's speech and movement seemed normal. Pt's mood was dysphoric, and had a flat affect was congruent. Pt's judgment and insight seemed poor.  What Do You Feel Would Help You the Most Today? Financial Resources;Housing Assistance;Food Assistance;Transportation Assistance  Determination of Need Routine (7 days) (Per [employer] Rankin NP pt is psychiatrically cleared.)   Corrie Dandy T. Jimmye Norman, MS, [facility], [facility] Triage Specialist [facility]

## 2022-01-09 NOTE — Discharge Instructions (Addendum)
[facility]: Outpatient psychiatric Services  New Patient Assessment and Therapy Walk-in Monday thru Thursday 8:00 am first come first serve until slots are full Every Friday from 1:00 pm to 4:00 pm first come first serve until slots are full  New Patient Psychiatric Medication Management Monday thru Friday from 8:00 am to 11:00 am first come first served until slots are full  For all walk-ins we ask that you arrive by 7:15 am because patients will be seen in there order of arrival.   Availability is limited, and therefore you may not be seen on the same day that you walk in.  Our goal is to serve and meet the needs of our community to the best of our ability.     [facility] Address: [address] [place], [place], [place] [address] Phone: [phone]  Supported Employment The supported employment program is a person-centered, individualized, evidence-based support service that helps members choose, acquire, and maintain competitive employment in our community. This service supports the varying needs of individuals and promotes community inclusion and employment success. Members enrolled in the supported employment program can expect the following:  Development of an individual career plan Community based job placement Job shadowing Job development On-site job Furniture conservator/restorer and support  Supported Education Supported education helps our members receive the education and training they need to achieve their learning and recovery goals. This will assist members with becoming gainfully employed in the job or career of their choice. The program includes assistance with: Registering for disability accommodations Enrolling in school and registering for classes Learning communication skills Scheduling tutoring sessions within your school [facility] partners with Vocational Rehabilitation to help increase the success of clients seeking employment  and educational goals.  Want to learn more about our programs?   Please contact our intake department INTAKE: [phone] Ext [phone]  Mailing: [address] [address]   [place], [place] [address]   [email or web address]     [facility] Center  Hours Monday - Friday: Services: 8:00AM - 3:00PM Offices: 8:00AM - 5:00PM  Physical Address [address] [place], [place] [address]   Please use this address for [facility] Mailing Address [address] [address] [place], [place] [address]  [facility] [employer] helps people reconnect This is a safe place to rest, take care of basic needs and access the services and community that make all the difference. Our guests come to the [facility] to take a class, do laundry, meet with a case manager or to get their mail. Sometimes they just need to sit in our dayroom and enjoy a conversation.  Here you will find everything from shower facilities to a computer lab, a mail room, classrooms and meeting spaces.  The IRC helps people reconnect with their own lives and with the community at large.  A caring community setting One of the most exciting aspects of the IRC is that so many individuals and organizations in the community are a part of the everyday experience. Whether it's a hair stylist or law firm offering services right in-house, our partners make the [facility] a truly interactive resource center where services are brought to our guests. The IRC brings together a comprehensive community of talented people who not only want to help solve problems, but also to be a part of our guests' lives.  Integrated Care We take a person-centered approach to assistance that includes: Case management Geneticist, molecular Medical clinic Mental health nurse Referrals  Fundamental Services We start with necessities: Showers and  hygiene supplies Materials engineer addresses and mailboxes Replacement IDs Onsite barbershop Storage lockers [place] winter warming  center  Self-Sufficiency We connect our guests with: Skilled trade classes Job skills classes Resume and jobs application assistance Interview training GED Advertising copywriter   Substance Abuse Treatment Programs  Intensive Outpatient Programs [place]     [place]. [place], [place]       [facility] [place] [address] [place], [place]  [place] [facility]     (Inpatient and outpatient)     [address]           [place] [phone] (Suboxone and Methadone)  [place], [place] [address]      [place] [address] [place], [place]  [facility] (Outpatient/Inpatient, Chemical)    (insurance only) [phone]             [facility] ([place]) [place], [place]     [facility]     [address]     [place], [place]       [facility] (for caregivers and family) [phone] [address] Kristeen Mans. [place], [place]      Residential Treatment Programs [facility]      [address] [place], [place] [address]  [phone]       [facility] [address], [place], [place] [address] [phone]  [facility]        [phone]       [facility] [phone]  [facility] ([place].)             [place], [place] or [place], [address] [phone]  [facility]    [address]      [place], [place]       [facility] [address] [place],  [place]  [place]   [address] [place], [place] [address]     [phone]      Admissions: 8am-3pm M-F  [facility] (RTS) [address] [place], [place]  BATS Program: Residential Program [phone] Days)   [place], [place] or [phone]     ADATC: [facility] [place], [place] (Walk in Hours over  referral)  [facility] [address], [place], [place] [address] [phone]  Crisis Mobile: [facility]:  1-877-626-1772 (for crisis response 24 hours a day) [facility] Hotline:      1-800-256-2452 Outpatient Psychiatry and Counseling  [facility]: Mobile Crisis Management 24 hours:  1-877-626-1772  [facility] sliding scale fee and walk in schedule: M-F 8am-12pm/1pm-3pm [address]  [place], [place] [address] [phone]  [facility] [address] [place], [place] [address] [phone]  [facility] ([facility])- new patient walk-in appointments available Monday - Friday 8am -3pm.          [address] [place], [place] [address] [phone] or crisis line- [phone]  [facility] Services/ Intensive Outpatient Therapy Program [address] [place], [place] [address] [phone]  [facility]                  Crisis Services      [phone]      [address]     [place], [place] [address]                 [facility]   [facility] [phone] [address] [place], [place] [address]   [facility]          [address],  [place], [place] [address]       [phone]  [facility] [address] [place], [place] [address] [phone]  [facility]    [address]    [place], [place] [address]     [phone]       Parish McKinney, MD     [address]     [place] [address]     [phone]       [facility] [address] [place] [address]  [facility]     [address]     [place], [place]      [phone]       [facility] Shamsher Ahluwalia, MD [address] [place], [place] [address] [phone]  [facility]     [address]     [place], [place] [address]     [phone]       [facility] [address] [place], [place] [address] [phone] Resources for Temporary Residential Assistance/Crisis Centers  DAY CENTERS [facility] Resource Center (IRC) M-F 8am-3pm   [address] [place], [place] [address]   [phone] Services include: laundry, barbering, support groups, case management, phone  & computer access, showers, AA/NA mtgs, mental health/substance abuse nurse, job skills class, disability information, VA assistance, spiritual classes, etc.   HOMELESS SHELTERS  [facility]     [facility]   [address], [place]     [phone]              [facility] (women and children)       [address] [place],  [address] [phone] [email or web address] for application and process Application Required  [facility]   [address]    [place]  [address]     [phone]                    Salvation   [employer] [facility] [address] [place], [place] [address] [phone] application appt.) Application Required  [facility] (women only)    [address]     [place], [place] [address]     [phone]      Intake starts 6pm daily Need valid ID, SSC, & Police report [employer] [address] [place], [place] [phone] Application  Required  [employer] (men only)     [address]      [place], [place]     [phone]       [facility] [place] (Pregnant women only) [address] [place], [place] [phone]  [facility]      [address]      [place], [place] [address]     [phone]             [facility] [address] [place], [place] [phone] 90 day commitment/SA/Application process  [facility](men only)     [address]     [place], [place]     [phone]       Check-in at [facility] [address] [place], [place] [address] [phone] Men/Women/Women and Children must be there by 7 pm  [facility] [place], [place] [phone]

## 2022-01-09 NOTE — ED Provider Notes (Signed)
Behavioral Health Urgent Care Medical Screening Exam  Patient Name: Shane Small MRN: [id number] Date of Evaluation: 01/09/22 Chief Complaint:   Diagnosis:  Final diagnoses:  Inability to access community resources due to transportation insecurity  Polysubstance abuse (HCC)  Substance-induced psychotic disorder with hallucinations (HCC)  Generalized anxiety disorder  Schizophrenia, paranoid (HCC)    History of Present illness: Shane Small is a 36 y.o. male  patient presented to [facility] as a walk in voluntarily via [employer] with complaints of paranoia and needing a place to stay.  Shane Small, 36 y.o., male patient seen face to face by this provider, consulted with Dr. Gretta Cool; and chart reviewed on 01/09/22.  On evaluation Shane Small reports he called the police to bring him here [facility]) "because shit happens everywhere I go."  States that he is sleeping in his car because he was put out of his father house after an argument with fathers girlfriend over some bread that he ate.  States that his care is parked in the [facility] ED parking lot.  States he has not follow up with any resources given to him related to his car will not start.  States he called his father today to get assistance finding out what is wrong with his car but father told him to call the police "so that's what I did."  Reports that he is unable to walk where he needs to go or ride the bus related to paranoia "I get stuck because I feel like everyone is trying to hurt me.  I feel like someone is trying to shoot me and I'm scared to get on the bus or walk anywhere.  States he only feels safe if he was to drive but now that is difficult relating to car not starting.  States that he doesn't feel that it is out of gas but unsure.  Patient states he is currently unemployed related to quitting his job prior to his last admission because he felt scared there.   Patient denies suicidal/self-harm/homicidal  ideation.  Patient stating that he needs a place to stay and a shower.  He was informed that he did not meet criteria for admission but would get him to the place [facility]) that could help.  Also informed that he needed to come to [facility] outpatient walk in so he can establish medication management and therapy.  Understanding voiced.  Patient asked if he could take a shower before he left.   During evaluation Shane Small is sitting in chair with no noted distress.  He is alert/oriented x 4; calm/cooperative; and his mood congruent with affect.  He is speaking in a clear tone at moderate volume, and normal pace; with good eye contact.  His thought process is coherent, relevant, and there is no indication that he is currently responding to internal/external stimuli or experiencing delusional thought content other than his endorsing auditory hallucinations and paranoia.  He denies suicidal/self-harm/homicidal ideation.   psychosis, and paranoia.   He remained calm throughout assessment and responded to questions appropriately.  He was allowed to take a shower and clean clothes given.  He was given a taxi to [facility].     Psychiatric Specialty Exam  Presentation  General Appearance:Appropriate for Environment; Disheveled; Other (comment) (Malodorous)  Eye Contact:Good  Speech:Clear and Coherent; Normal Rate  Speech Volume:Normal  Handedness:Right   Mood and Affect  Mood:Dysphoric  Affect:Congruent   Thought Process  Thought Processes:Coherent;  Goal Directed  Descriptions of Associations:Intact  Orientation:Full (Time, Place and Person)  Thought Content:Logical  Diagnosis of Schizophrenia or Schizoaffective disorder in past: Yes  Duration of Psychotic Symptoms: Greater than six months  Hallucinations:Auditory Reports he is hearing voices but unable to states what the voices are saying  Ideas of Reference:Paranoia (Reports tha he feels that someone is trying to hurt  him.  Feel like someone wants to shoot him)  Suicidal Thoughts:No  Homicidal Thoughts:No With Plan   Sensorium  Memory:Immediate Good; Recent Good; Remote Good  Judgment:Intact  Insight:Present   Executive Functions  Concentration:Good  Attention Span:Good  Recall:Good  Fund of Knowledge:Good  Language:Good   Psychomotor Activity  Psychomotor Activity:Normal   Assets  Assets:Communication Skills; Desire for Improvement; Physical Health; Resilience   Sleep  Sleep:Fair (Reports he has been sleeping in his car)  Number of hours: 6   Nutritional Assessment (For OBS and FBC admissions only) Has the patient had a weight loss or gain of 10 pounds or more in the last 3 months?: No Has the patient had a decrease in food intake/or appetite?: Yes (Reports having problem buying food) Does the patient have eating habits or behaviors that may be indicators of an eating disorder including binging or inducing vomiting?: No Has the patient recently lost weight without trying?: 0 Has the patient been eating poorly because of a decreased appetite?: 0 Malnutrition Screening Tool Score: 0    Physical Exam: Physical Exam Vitals and nursing note reviewed. Exam conducted with a chaperone present.  Constitutional:      General: He is not in acute distress.    Appearance: Normal appearance. He is not ill-appearing.  Eyes:     Pupils: Pupils are equal, round, and reactive to light.  Cardiovascular:     Rate and Rhythm: Normal rate.  Pulmonary:     Effort: Pulmonary effort is normal.  Musculoskeletal:        General: Normal range of motion.     Cervical back: Normal range of motion.  Skin:    General: Skin is warm and dry.  Neurological:     Mental Status: He is alert and oriented to person, place, and time.  Psychiatric:        Attention and Perception: Attention and perception normal. He does not perceive auditory (Doesn't appear to be responding but endorses hearing  voices) or visual hallucinations.        Mood and Affect: Affect normal. Depressed: dysphoric.        Speech: Speech normal.        Behavior: Behavior normal. Behavior is cooperative.        Thought Content: Thought content is paranoid (endorse paranoid that someone is trying to hurt him). Thought content is not delusional. Thought content does not include homicidal or suicidal ideation.        Cognition and Memory: Cognition normal.        Judgment: Judgment is impulsive.    Review of Systems  Constitutional: Negative.   HENT: Negative.    Eyes: Negative.   Respiratory: Negative.    Cardiovascular: Negative.   Gastrointestinal: Negative.   Genitourinary: Negative.   Musculoskeletal: Negative.   Skin: Negative.   Neurological: Negative.   Endo/Heme/Allergies: Negative.   Psychiatric/Behavioral:  Positive for hallucinations. Negative for suicidal ideas (Denies). Depression: Dysphoric related to transportation issues. Substance abuse: Hx of polysubstance abuse.  Denies drug use at this time.The patient is nervous/anxious. Insomnia: Sleep fair related to having to sleep in  his car.       Reports he has no where to stay.  States he was kicked out of his fathers house related to fathers girlfriend getting mad at him for eating the bread.  Now sleeping in his car     Blood pressure (!) 123/93, pulse 100, temperature 98.3 F (36.8 C), temperature source Oral, resp. rate 20, SpO2 98 %. There is no height or weight on file to calculate BMI.  Musculoskeletal: Strength & Muscle Tone: within normal limits Gait & Station: normal Patient leans: N/A   [facility] MSE Discharge Disposition for Follow up and Recommendations: Based on my evaluation the patient does not appear to have an emergency medical condition and can be discharged with resources and follow up care in outpatient services for Medication Management, Substance Abuse Intensive Outpatient Program, and Individual Therapy    Discharge  Instructions       [facility]: Outpatient psychiatric Services  New Patient Assessment and Therapy Walk-in Monday thru Thursday 8:00 am first come first serve until slots are full Every Friday from 1:00 pm to 4:00 pm first come first serve until slots are full  New Patient Psychiatric Medication Management Monday thru Friday from 8:00 am to 11:00 am first come first served until slots are full  For all walk-ins we ask that you arrive by 7:15 am because patients will be seen in there order of arrival.   Availability is limited, and therefore you may not be seen on the same day that you walk in.  Our goal is to serve and meet the needs of our community to the best of our ability.     [facility] Address: [address], [place], [place] [address] Phone: [phone]  Supported Employment The supported employment program is a person-centered, individualized, evidence-based support service that helps members choose, acquire, and maintain competitive employment in our community. This service supports the varying needs of individuals and promotes community inclusion and employment success. Members enrolled in the supported employment program can expect the following:  Development of an individual career plan Community based job placement Job shadowing Job development On-site job Furniture conservator/restorertraining Educational goal planning and support  Supported Education Supported education helps our members receive the education and training they need to achieve their learning and recovery goals. This will assist members with becoming gainfully employed in the job or career of their choice. The program includes assistance with: Registering for disability accommodations Enrolling in school and registering for classes Learning communication skills Scheduling tutoring sessions within your school [facility] partners with Vocational Rehabilitation to help increase the success of  clients seeking employment and educational goals.  Want to learn more about our programs?   Please contact our intake department INTAKE: [phone] Ext [phone]  Mailing: [address] [address]   [place], [place] [address]   [email or web address]     Interactive Resource Center  Hours Monday - Friday: Services: 8:00AM - 3:00PM Offices: 8:00AM - 5:00PM  Physical Address [address] [place], [place] [address]   Please use this address for [facility] Mailing Address [id number] [place], [place] [address]  The [facility] helps people reconnect This is a safe place to rest, take care of basic needs and access the services and community that make all the difference. Our guests come to the [facility] to take a class, do laundry, meet with a case manager or to get their mail. Sometimes they just need to sit in our dayroom and enjoy a conversation.  Here you will find everything from shower facilities to a computer lab, a mail room, classrooms and meeting spaces.  The IRC helps people reconnect with their own lives and with the community at large.  A caring community setting One of the most exciting aspects of the IRC is that so many individuals and organizations in the community are a part of the everyday experience. Whether it's a hair stylist or law firm offering services right in-house, our partners make the [facility] a truly interactive resource center where services are brought to our guests. The IRC brings together a comprehensive community of talented people who not only want to help solve problems, but also to be a part of our guests' lives.  Integrated Care We take a person-centered approach to assistance that includes: Case management Geneticist, molecular Medical clinic Mental health nurse Referrals  Fundamental Services We start with necessities: Midwife and Armed forces operational officer addresses and mailboxes Replacement IDs Onsite barbershop Storage lockers White Flag winter warming  center  Self-Sufficiency We connect our guests with: Skilled trade classes Job skills classes Resume and jobs application assistance Interview training GED Academic librarian   Substance Abuse Treatment Programs  Intensive Outpatient Programs [facility]     [address]      [place], [place]                   [phone]       [facility] [address] [place], [place] [phone]  [facility]     (Inpatient and outpatient)     [address]           [phone]    [facility] [phone] (Suboxone and Methadone)  [address]      [place], [place] [address]      [phone]       [address] [place], [place] [phone]  [facility] (Outpatient/Inpatient, Chemical)    (insurance only) [phone]             [facility] (Groups & Residential) [place], [place] [phone]     [facility]     [address]     [place], [place]      [phone]       [facility] (for caregivers and family) [phone] [address] [place], [place] [phone]      Residential Treatment Programs [facility]      [address] [place], [place] [address]  [phone]       [facility] [address], [place], [place] [address] [phone]  [facility] [place]        [phone]       [facility] [phone]  [facility] (Addiction Recovery [facility]             [address]                                         [place], [place]                                                [phone] or [phone]  [facility] [address] [place], [address] [phone]  [facility]    [address]      [place], [place]     [phone]       [facility] [address] [place],  [place] [phone]  [facility]   [address] [place], [place] [address]     [phone]      Admissions: 8am-3pm M-F  [facility] Treatment Services (RTS) [address] [place], [place] [phone]  [facility] Program: Residential Program [phone] Days)   [place], [place]      [phone] or [phone]     ADATC: [facility] [place], [place] (Walk in Hours over the weekend or by referral)  [facility] [address] [place], [place], [place] [address] [phone]  Crisis Mobile: [facility]:  [phone] (for crisis response 24 hours a day) [facility] Hotline:      [phone] Outpatient Psychiatry and Counseling  [facility]: Mobile Crisis Management 24 hours:  [phone]  [facility] of the [employer] sliding scale fee and walk in schedule: M-F 8am-12pm/1pm-3pm [address]  [place], [place] [address] [phone]  [facility] [address] [place], [place] [address] [phone]  [facility] (Formerly known as [facility] [employer])- new patient walk-in appointments available Monday - Friday 8am -3pm.          [address] [place], [place] [address] [phone] or crisis line- [phone]  [facility] Outpatient Services/ Intensive Outpatient Therapy Program [address] [place], [place] [address] [phone]  [facility]                  Crisis Services      [phone] [address]     [place], [place] [address]                 [facility]   [facility] [phone]. [address] [place], [place] [address]   [facility]          [address] [place]  [place], [place] [address]       [phone]  [facility] [address] [place], [place] [address] [phone]  [facility]    [address]    [place], [place] [address]     [phone]       Andee Poles, MD     [address]     [place] [address]     [phone]       [facility] [address] [place] [address]  [facility]     [address] [place], [place]      [phone]       [facility] Eulogio Ditch, MD [address] [place], [place] [address] [phone]  [facility]     [address]     [place], [place] [address]     [phone]       [facility] [address] [place], [place] [address] [phone] Resources for Temporary Residential Assistance/Crisis Centers  DAY CENTERS [facility] [place]) M-F 8am-3pm   [address] [place], [place] [address]   [phone] Services include: laundry, barbering, support groups, case management,  phone  & computer access, showers, AA/NA mtgs, mental health/substance abuse nurse, job skills class, disability information, VA assistance, spiritual classes, etc.   HOMELESS SHELTERS  [facility]     [facility]   [address], [place]     [phone]              [employer] (women and children)       [address] [place], [place] [address] [phone] [email or web address] for application and process Application Required  [facility]   [address]    [place] [address]     [phone]                    [facility] [address] [place], [place] [address] [phone] application appt.) Application Required  [facility] (women only)    [address]     [place], [place] [address]     [phone]      Intake starts 6pm daily Need valid ID, SSC, & Police report [employer] [address] [place], [place] [phone] Application  Required  [employer] (men only)     [address]      [place], [place]     [phone]       [facility] [place] (Pregnant women only) [address] [place], [place] [phone]  [facility]      [address]      [place], [place] [address]     [phone]             [facility] [address] [place], [place] [phone] 90 day commitment/SA/Application process  [facility](men only)     [address]     [place], [place]     [phone]       Check-in at [facility] [address] [place], [place] [address] [phone] Men/Women/Women and Children must be there by 7 pm  [facility] [place], [place] [phone]                       Assunta Found, NP 01/09/2022, 1:00 PM

## 2022-01-09 NOTE — Progress Notes (Signed)
Patient is discharging at this time. Patient is A&Ox4. Vs stable. Patient denies SI,HI, and A/V/H with no plan/intent. Printed AVS reviewed with and given to patient. Patient verbalized all understanding. All valuables/belongings returned to patient. Patient is being transported by [employer] service. Patient denies any pain/discomfort. No s/s of current distress.

## 2022-01-26 IMAGING — CT CT HEAD W/O CM
4 series · 17 of 47 positions shown, 19 images · non-contrast
Comparison: None.

CLINICAL DATA: Right temporal headache for a couple months with
nausea and vomiting.

EXAM:
CT HEAD WITHOUT CONTRAST
TECHNIQUE: Contiguous axial images were obtained from the base of the skull
through the vertex without intravenous contrast.

[Series 3: head wo · axial · 0.43mm/px · z∈[+1230,+1350]mm · 7 of 34 slices shown, 9 images]
[im 5/34  brain]
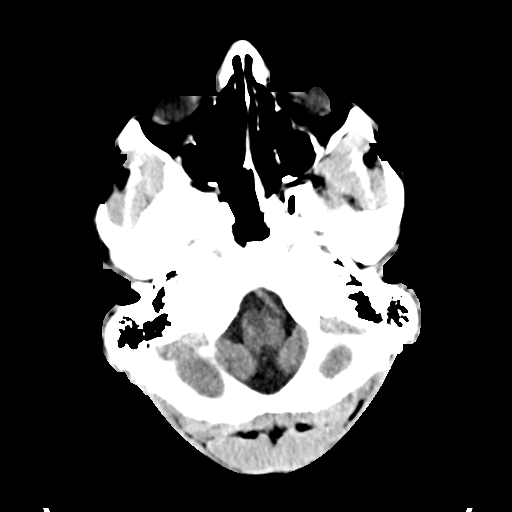
[im 5/34  bone]
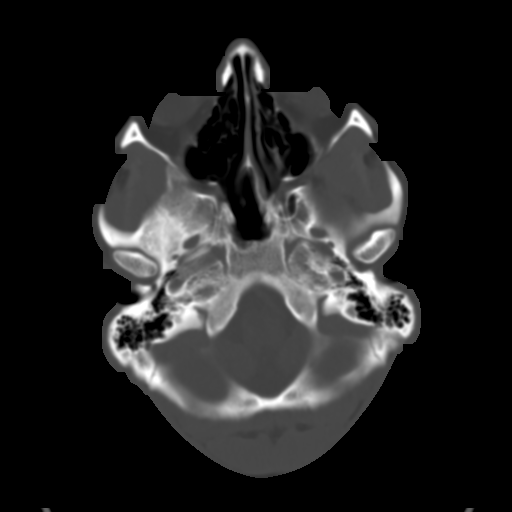
[im 9/34  brain]
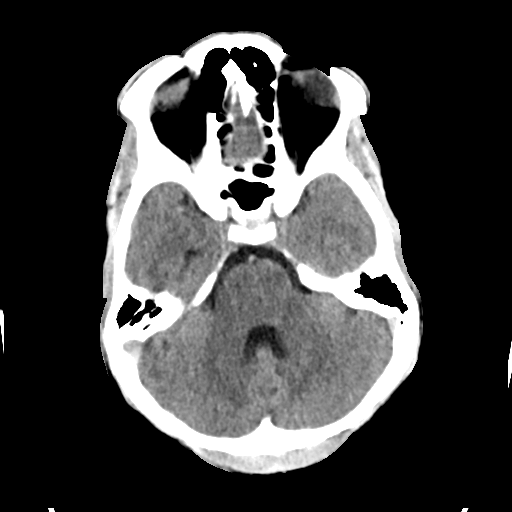
[im 13/34  brain]
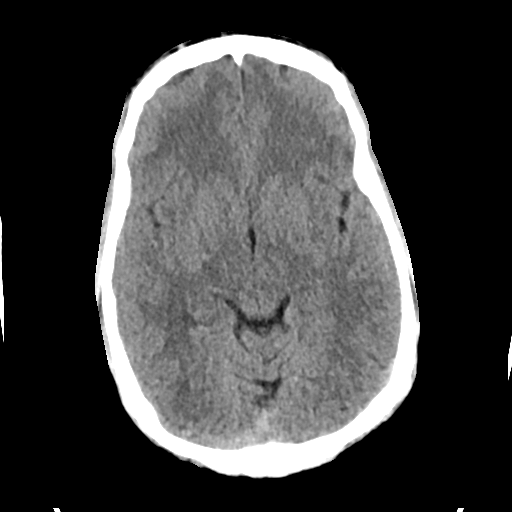
[im 17/34  brain]
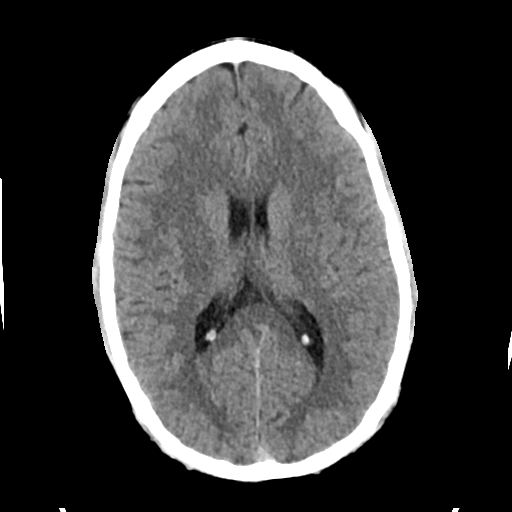
[im 21/34  brain]
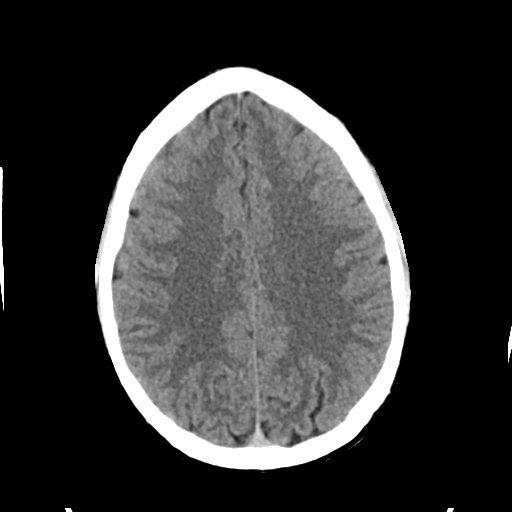
[im 21/34  bone]
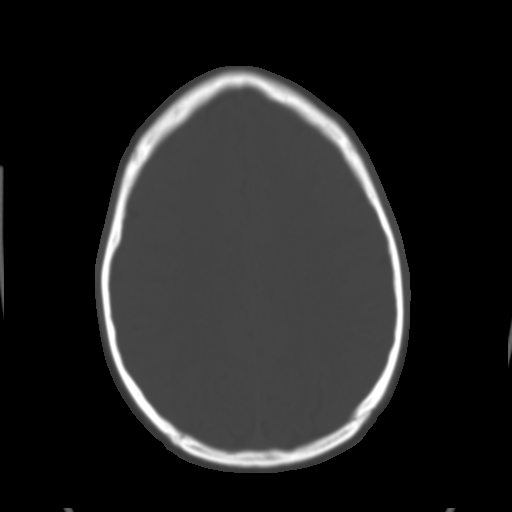
[im 25/34  brain]
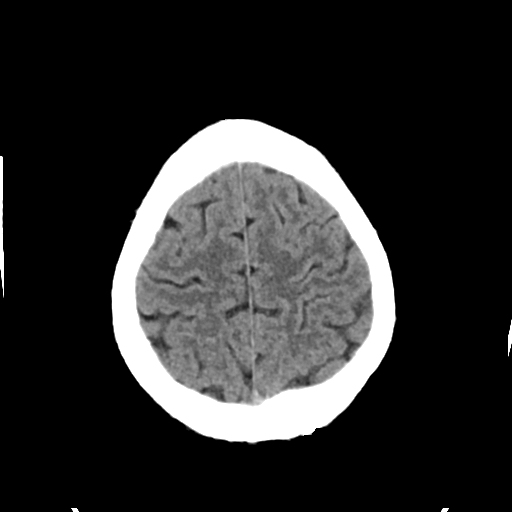
[im 29/34  brain]
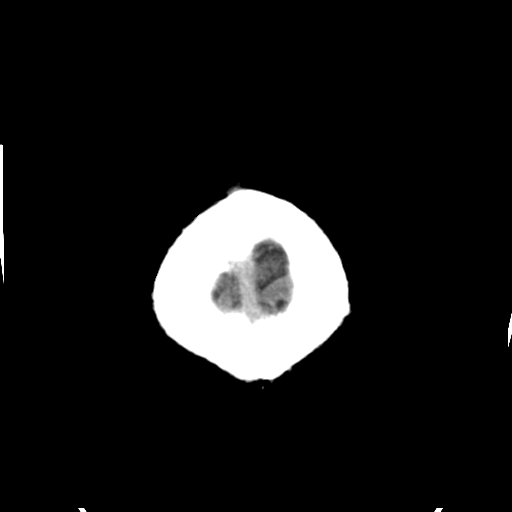

[Series 4: head bone · axial · 0.43mm/px · z∈[+1226,+1284]mm · 4 of 85 slices shown]
[im 9/85  bone]
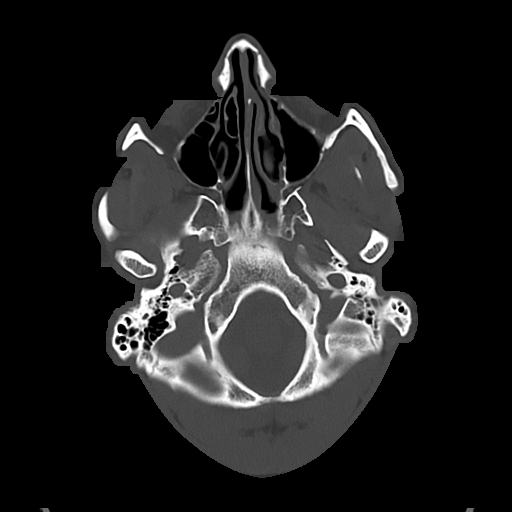
[im 17/85  bone]
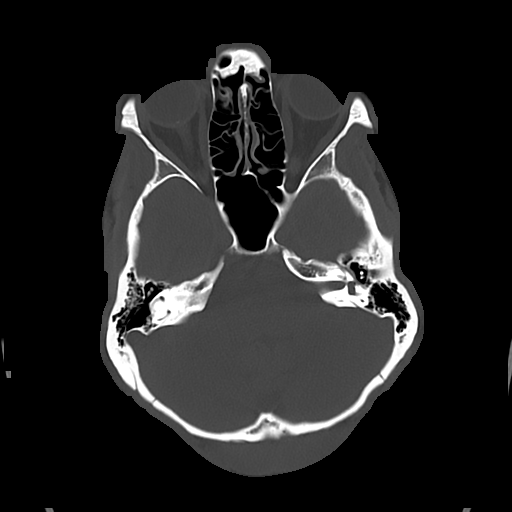
[im 26/85  bone]
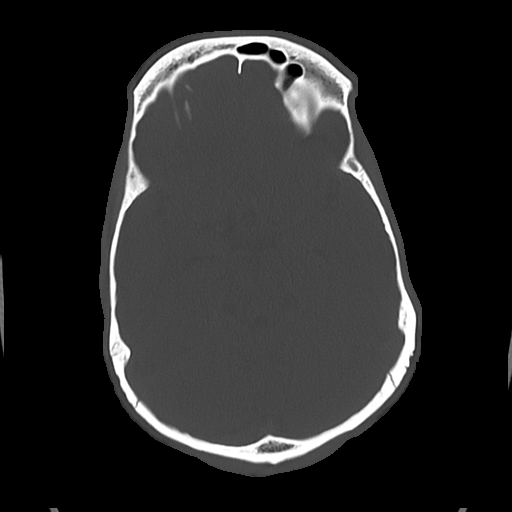
[im 38/85  bone]
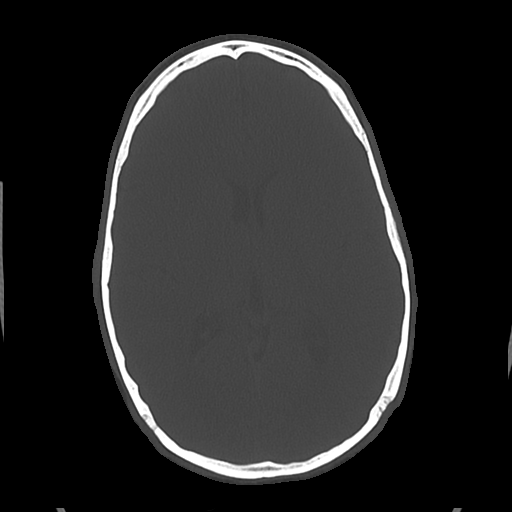

[Series 5: cor soft · coronal · 0.33mm/px · 3 of 70 slices shown]
[im 24/70  brain]
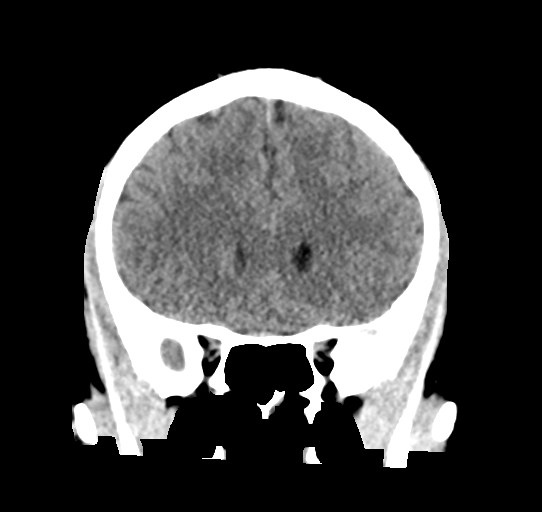
[im 31/70  brain]
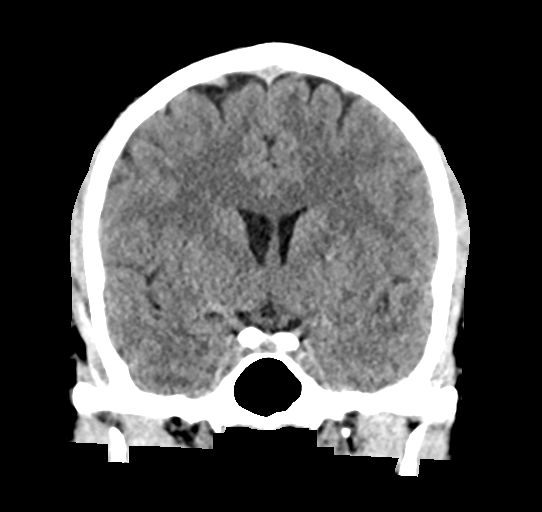
[im 39/70  brain]
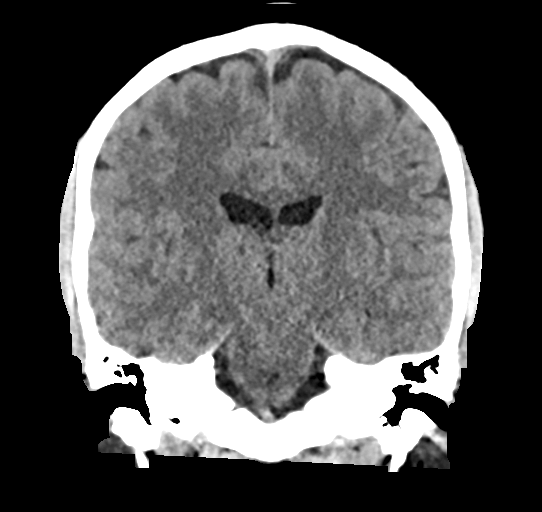

[Series 6: sag soft · sagittal · 0.33mm/px · 3 of 60 slices shown]
[im 20/60  brain]
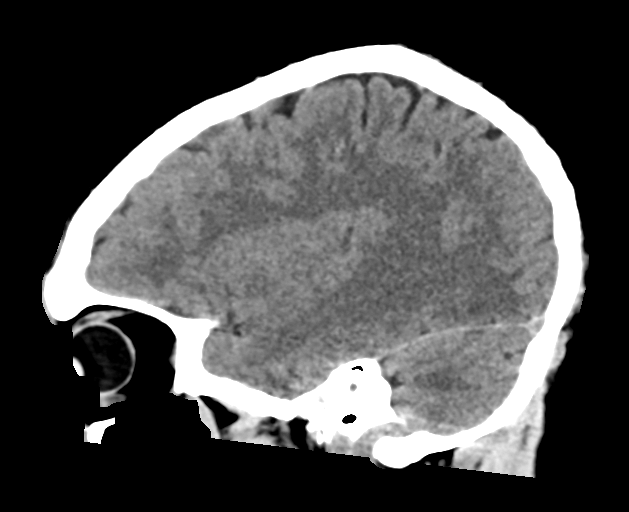
[im 30/60  brain]
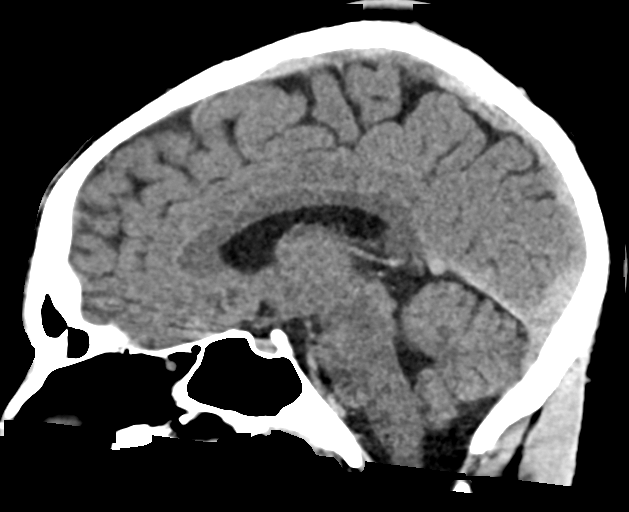
[im 40/60  brain]
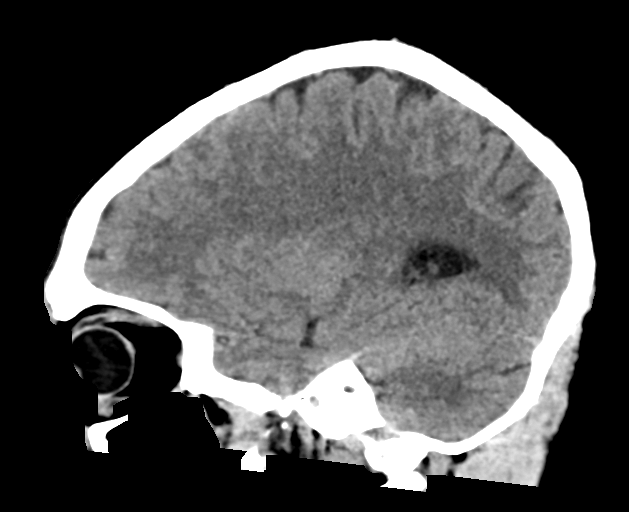

[17 of 47 positions shown; findings below may reference images not displayed]

FINDINGS: Brain: No evidence of acute infarction, hemorrhage, hydrocephalus,
extra-axial collection or mass lesion/mass effect.

Vascular: No hyperdense vessel or unexpected calcification.

Skull: Normal. Negative for fracture or focal lesion.

Sinuses/Orbits: No acute finding.

Other: None.
IMPRESSION: No acute intracranial process.

## 2022-02-07 ENCOUNTER — Emergency Department (HOSPITAL_COMMUNITY)
Admission: EM | Admit: 2022-02-07 | Discharge: 2022-02-08 | Discharge status: Against Medical Advice | Payer: Self-pay | Attending: Emergency Medicine | Admitting: Emergency Medicine

## 2022-02-07 ENCOUNTER — Other Ambulatory Visit: Payer: Self-pay

## 2022-02-07 DIAGNOSIS — L02211 Cutaneous abscess of abdominal wall: Secondary | ICD-10-CM | POA: Insufficient documentation

## 2022-02-07 DIAGNOSIS — Z5321 Procedure and treatment not carried out due to patient leaving prior to being seen by health care provider: Secondary | ICD-10-CM | POA: Insufficient documentation

## 2022-02-07 NOTE — ED Provider Triage Note (Signed)
Emergency Medicine Provider Triage Evaluation Note  ARRIAN Small , a 36 y.o. male  was evaluated in triage.  Pt complains of abdominal abscess onset several days ago.  No fevers or chills.  No vomiting..  Review of Systems  Positive: Abscess Negative: Fever  Physical Exam  BP 139/85 (BP Location: Right Arm)   Pulse 96   Temp 99.1 F (37.3 C)   Resp 18   SpO2 98%  Gen:   Awake, no distress   Resp:  Normal effort  MSK:   Moves extremities without difficulty  Other:  Patient with abscess to the lower abdomen.  Induration, erythema noted.  Purulent drainage expressed as well.  Medical Decision Making  Medically screening exam initiated at 8:33 PM.  Appropriate orders placed.  Shane Small was informed that the remainder of the evaluation will be completed by another provider, this initial triage assessment does not replace that evaluation, and the importance of remaining in the ED until their evaluation is complete.  Patient with superficial abdominal wall abscess.  Will likely need I&D.   Shane Devoid, PA-C 02/07/22 2035

## 2022-02-07 NOTE — ED Triage Notes (Signed)
Pt reported to WED with c/o pus and drainage from wound to lower abdomen tha thas been present for 2-3 days. Pt states he does not know how or exactly when wounds appeared.

## 2022-02-08 NOTE — ED Notes (Signed)
CALL PATIENT X3 NO ANSWER

## 2022-03-02 ENCOUNTER — Encounter (HOSPITAL_COMMUNITY): Payer: Self-pay | Admitting: Emergency Medicine

## 2022-03-02 ENCOUNTER — Other Ambulatory Visit: Payer: Self-pay

## 2022-03-02 ENCOUNTER — Emergency Department (HOSPITAL_COMMUNITY)
Admission: EM | Admit: 2022-03-02 | Discharge: 2022-03-03 | Disposition: A | Discharge status: Home / Self Care | Payer: Self-pay | Attending: Emergency Medicine | Admitting: Emergency Medicine

## 2022-03-02 DIAGNOSIS — H5789 Other specified disorders of eye and adnexa: Secondary | ICD-10-CM | POA: Insufficient documentation

## 2022-03-02 DIAGNOSIS — Z760 Encounter for issue of repeat prescription: Secondary | ICD-10-CM | POA: Insufficient documentation

## 2022-03-02 NOTE — ED Triage Notes (Addendum)
Pt in via [facility] with eye irritation, reporting some itching and redness. States he used OTC allergy eye drops with no relief. Reporting some blurred vision, denies any irritating substance exposure

## 2022-03-03 MED ORDER — ERYTHROMYCIN 5 MG/GM OP OINT: TOPICAL_OINTMENT | OPHTHALMIC | 0 refills | Status: DC

## 2022-03-03 MED ORDER — ALBUTEROL SULFATE HFA 108 (90 BASE) MCG/ACT IN AERS: 1.0000 | INHALATION_SPRAY | Freq: Four times a day (QID) | RESPIRATORY_TRACT | 0 refills | Status: DC | PRN

## 2022-03-03 NOTE — ED Notes (Signed)
Patient provided with Rx card and bus pass at time of discharge.

## 2022-03-03 NOTE — ED Provider Notes (Signed)
[facility] DEPT Provider Note   CSN: [id number] Arrival date & time: 03/02/22  2301     History  Chief Complaint  Patient presents with   Eye Problem    WINFREY CHILLEMI is a 36 y.o. male.  The history is provided by the patient.  Eye Problem Location:  Both eyes Quality: they feel inflamed. Severity:  Mild Onset quality:  Gradual Duration:  1 day Timing:  Constant Progression:  Unchanged Chronicity:  New Context: not burn   Relieved by:  Nothing Worsened by:  Nothing Ineffective treatments:  None tried Associated symptoms: itching   Associated symptoms: no blurred vision, no discharge, no photophobia and no redness   Risk factors: no conjunctival hemorrhage   Also wants his albuterol refilled.  No trauma.       Home Medications Prior to Admission medications   Medication Sig Start Date End Date Taking? Authorizing Provider  diphenhydrAMINE (SOMINEX) 25 MG tablet Take 25 mg by mouth daily.   Yes [provider]  Naphazoline HCl (CLEAR EYES OP) Place 1 drop into both eyes daily as needed (For dry eyes).   Yes [provider]  ARIPiprazole (ABILIFY) 15 MG tablet Take 0.5 tablets (7.5 mg total) by mouth daily. Patient not taking: Reported on 02/07/2022 01/08/22   Rankin, Shuvon B, NP  hydrOXYzine (ATARAX) 25 MG tablet Take 1 tablet (25 mg total) by mouth 3 (three) times daily as needed for anxiety. Patient not taking: Reported on 02/07/2022 01/07/22   Rankin, Shuvon B, NP  loratadine (CLARITIN) 10 MG tablet Take 1 tablet (10 mg total) by mouth daily. Patient not taking: Reported on 02/07/2022 01/08/22   Rankin, Shuvon B, NP  traZODone (DESYREL) 50 MG tablet Take 1 tablet (50 mg total) by mouth at bedtime as needed for sleep. Patient not taking: Reported on 02/07/2022 01/07/22   Rankin, Shuvon B, NP  atomoxetine (STRATTERA) 10 MG capsule Take 10 mg by mouth daily.  10/31/19  [provider]  omeprazole (PRILOSEC) 20  MG capsule Take 1 capsule (20 mg total) by mouth 2 (two) times daily before a meal. 09/19/19 10/31/19  Darr, Edison Nasuti, PA-C      Allergies    Shellfish allergy    Review of Systems   Review of Systems  Constitutional:  Negative for fever.  HENT:  Negative for ear discharge.   Eyes:  Positive for itching. Negative for blurred vision, photophobia, pain, discharge, redness and visual disturbance.  All other systems reviewed and are negative.   Physical Exam Updated Vital Signs BP (!) 165/87 (BP Location: Left Arm)   Pulse 77   Temp (!) 97.5 F (36.4 C) (Oral)   Resp 18   Wt 72.6 kg   SpO2 97%   BMI 22.32 kg/m  Physical Exam Vitals and nursing note reviewed.  Constitutional:      General: He is not in acute distress.    Appearance: He is well-developed. He is not diaphoretic.  HENT:     Head: Normocephalic and atraumatic.     Nose: Nose normal.  Eyes:     General: No scleral icterus.       Right eye: No discharge.        Left eye: No discharge.     Extraocular Movements: Extraocular movements intact.     Conjunctiva/sclera: Conjunctivae normal.     Pupils: Pupils are equal, round, and reactive to light.  Cardiovascular:     Rate and Rhythm: Normal rate and regular  rhythm.     Pulses: Normal pulses.     Heart sounds: Normal heart sounds.  Pulmonary:     Effort: Pulmonary effort is normal.     Breath sounds: Normal breath sounds. No wheezing or rales.  Abdominal:     General: Bowel sounds are normal.     Palpations: Abdomen is soft.     Tenderness: There is no abdominal tenderness. There is no guarding or rebound.  Musculoskeletal:        General: Normal range of motion.     Cervical back: Normal range of motion and neck supple.  Skin:    General: Skin is warm and dry.     Capillary Refill: Capillary refill takes less than 2 seconds.  Neurological:     General: No focal deficit present.     Mental Status: He is alert and oriented to person, place, and time.   Psychiatric:        Mood and Affect: Mood normal.        Behavior: Behavior normal.     ED Results / Procedures / Treatments   Labs (all labs ordered are listed, but only abnormal results are displayed) Labs Reviewed - No data to display  EKG None  Radiology No results found.  Procedures Procedures    Medications Ordered in ED Medications - No data to display  ED Course/ Medical Decision Making/ A&P                           Medical Decision Making Eye irritation.  Also needs a refill.  Sleeping in room.    Amount and/or Complexity of Data Reviewed External Data Reviewed: notes.    Details: Previous notes reviewed   Risk Prescription drug management. Risk Details: I do not suspect corneal abrasions.      Final Clinical Impression(s) / ED Diagnoses Final diagnoses:  None   Return for intractable cough, coughing up blood, fevers > 100.4 unrelieved by medication, shortness of breath, intractable vomiting, chest pain, shortness of breath, weakness, numbness, changes in speech, facial asymmetry, abdominal pain, passing out, Inability to tolerate liquids or food, cough, altered mental status or any concerns. No signs of systemic illness or infection. The patient is nontoxic-appearing on exam and vital signs are within normal limits.  I have reviewed the triage vital signs and the nursing notes. Pertinent labs & imaging results that were available during my care of the patient were reviewed by me and considered in my medical decision making (see chart for details). After history, exam, and medical workup I feel the patient has been appropriately medically screened and is safe for discharge home. Pertinent diagnoses were discussed with the patient. Patient was given return precautions.  Rx / DC Orders ED Discharge Orders     None         Jamyah Folk, MD 03/03/22 [id number]

## 2022-04-06 ENCOUNTER — Ambulatory Visit (HOSPITAL_COMMUNITY)
Admission: EM | Admit: 2022-04-06 | Discharge: 2022-04-06 | Disposition: A | Discharge status: Home / Self Care | Payer: No Payment, Other | Attending: Family | Admitting: Family

## 2022-04-06 DIAGNOSIS — F200 Paranoid schizophrenia: Secondary | ICD-10-CM

## 2022-04-06 MED ORDER — ARIPIPRAZOLE 15 MG PO TABS: 7.5000 mg | ORAL_TABLET | Freq: Every day | ORAL | 0 refills | Status: DC

## 2022-04-06 NOTE — Discharge Instructions (Signed)

## 2022-04-06 NOTE — ED Triage Notes (Signed)
Pt presents to [facility] voluntarily due to being off of his medication (abilify) for about 1 year. Pt states he did not have the money to refill his prescription. Pt reports alcohol use daily, last use yesterday, unknown amount. Pt reports he is currently homeless. Pt reports auditory and visual hallucinations, seeing himself get shot, voices telling him to get executed. Pt denies SI/HI.

## 2022-04-06 NOTE — ED Provider Notes (Signed)
Behavioral Health Urgent Care Medical Screening Exam  Patient Name: Shane Small MRN: [id number] Date of Evaluation: 04/06/22 Chief Complaint:   Diagnosis:  Final diagnoses:  Schizophrenia, paranoid (HCC)    History of Present illness: Shane Small is a 36 y.o. male. Patient presents voluntarily to [facility] behavioral health for walk-in assessment.  Patient requests medication refill for Abilify. He is unable to recall timeline, last consistent with medications one year ago.  He is currently homeless and has had difficulty in affording his medication.  Patient reports auditory and visual hallucinations, ongoing for approximately 4 years.  Patient states "it is a bunch of stuff, stuff like I am going to get shot, or I hear things like go this way or go that way."   Patient is assessed, face-to-face, by nurse practitioner. He is seated in assessment area, no acute distress. Consulted with provider, Dr.  Lucianne Muss, and chart reviewed on 04/06/2022.  He is alert and oriented, pleasant and cooperative during assessment.   Patient  presents with euthymic mood, congruent affect.  He denies suicidal and homicidal ideations. Denies history of suicide attempts, denies history of nonsuicidal self-harm behavior.  Patient easily  contracts verbally for safety with this Clinical research associate.  Patient states "my main issue is that I have no place to stay where it is safe."  Most recently  he resides in his car.  Shane Small has been diagnosed with paranoid schizophrenia.  He is not linked with outpatient psychiatry.  He has been linked with [facility] in the past and would prefer to return.  Vesta Mixer has directed him to follow-up at [facility], he has some difficulty in transportation as he has a limited money for gas.  He endorses history of multiple previous inpatient psychiatric hospitalizations.  No family mental health history reported.    Patient has normal speech and behavior.    Patient is able to converse coherently with goal-directed thoughts and no distractibility or preoccupation.  Objectively there is no evidence of psychosis/mania or delusional thinking.  Shane Small has been homeless in [place] for 1 to 2 years.  He denies access to weapons.  He is typically employed in [employer], beginning a new job in 4 days.  Patient endorses average sleep and appetite.  He endorses alcohol use, states "every day if I have the money."  Regarding amount of alcohol use he states "I am not sure."  He denies substance use aside from alcohol.  Denies history of alcohol-related seizure and DTs.  He is not ready to consider cessation of alcohol use at this time.  Patient offered support and encouragement.  He declines any person to contact for collateral information at this time.   Patient educated and verbalizes understanding of mental health resources and other crisis services in the community. They are instructed to call 911 and present to the nearest emergency room should patient experience any suicidal/homicidal ideation, auditory/visual/hallucinations, or detrimental worsening of mental health condition.      Flowsheet Row ED from 04/06/2022 in [facility] ED from 03/02/2022 in [facility] DEPT ED from 02/07/2022 in [facility] EMERGENCY DEPARTMENT  C-SSRS RISK CATEGORY No Risk No Risk No Risk       Psychiatric Specialty Exam  Presentation  General Appearance:Appropriate for Environment  Eye Contact:Good  Speech:Clear and Coherent; Normal Rate  Speech Volume:Normal  Handedness:Right   Mood and Affect  Mood: Euthymic  Affect: Appropriate; Congruent   Thought Process  Thought Processes: Coherent; Goal Directed; Linear  Descriptions of Associations:Intact  Orientation:Full (Time, Place and Person)  Thought Content:Logical  Diagnosis of Schizophrenia or Schizoaffective  disorder in past: Yes  Duration of Psychotic Symptoms: Greater than six months  Hallucinations:Auditory "I see stuff like I could get shot,  I hear stuff like go this way or go that way"  Ideas of Reference:None  Suicidal Thoughts:No  Homicidal Thoughts:No With Plan   Sensorium  Memory: Immediate Good; Recent Good  Judgment: Intact  Insight: Present   Executive Functions  Concentration: Good  Attention Span: Good  Recall: Good  Fund of Knowledge: Good  Language: Good   Psychomotor Activity  Psychomotor Activity: Normal   Assets  Assets: Communication Skills; Desire for Improvement; Physical Health; Resilience   Sleep  Sleep: Fair  Number of hours:  6   No data recorded  Physical Exam: Physical Exam Vitals and nursing note reviewed.  Constitutional:      Appearance: Normal appearance. He is well-developed and normal weight.  HENT:     Head: Normocephalic and atraumatic.     Nose: Nose normal.  Cardiovascular:     Rate and Rhythm: Normal rate.  Pulmonary:     Effort: Pulmonary effort is normal.  Musculoskeletal:        General: Normal range of motion.     Cervical back: Normal range of motion.  Skin:    General: Skin is warm and dry.  Neurological:     Mental Status: He is alert and oriented to person, place, and time.  Psychiatric:        Attention and Perception: Attention normal. He perceives auditory and visual hallucinations.        Mood and Affect: Mood and affect normal.        Speech: Speech normal.        Behavior: Behavior normal. Behavior is cooperative.        Thought Content: Thought content normal.        Cognition and Memory: Cognition and memory normal.    Review of Systems  Constitutional: Negative.   HENT: Negative.    Eyes: Negative.   Respiratory: Negative.    Cardiovascular: Negative.   Gastrointestinal: Negative.   Genitourinary: Negative.   Musculoskeletal: Negative.   Skin: Negative.    Neurological: Negative.   Psychiatric/Behavioral:  Positive for hallucinations.    Pulse (!) 103, temperature 98.4 F (36.9 C), temperature source Oral, resp. rate 18, SpO2 98 %. There is no height or weight on file to calculate BMI.  Musculoskeletal: Strength & Muscle Tone: within normal limits Gait & Station: normal Patient leans: N/A   [place] [facility] Discharge Disposition for Follow up and Recommendations: Based on my evaluation the patient does not appear to have an emergency medical condition and can be discharged with resources and follow up care in outpatient services for Medication Management and Individual Therapy Follow-up with outpatient psychiatry at [facility] behavioral health. Follow-up with primary care provider as well as pharmacy resources at [facility] and wellness. Medication: -Aripiprazole 7.5 mg p.o. daily/mood   Lucky Rathke, FNP 04/06/2022, 6:33 PM

## 2022-04-15 ENCOUNTER — Emergency Department (HOSPITAL_COMMUNITY)
Admission: EM | Admit: 2022-04-15 | Discharge: 2022-04-15 | Disposition: A | Discharge status: Home / Self Care | Payer: Self-pay | Attending: Emergency Medicine | Admitting: Emergency Medicine

## 2022-04-15 ENCOUNTER — Encounter (HOSPITAL_COMMUNITY): Payer: Self-pay

## 2022-04-15 ENCOUNTER — Other Ambulatory Visit: Payer: Self-pay

## 2022-04-15 ENCOUNTER — Emergency Department (HOSPITAL_COMMUNITY): Payer: Self-pay

## 2022-04-15 DIAGNOSIS — Z139 Encounter for screening, unspecified: Secondary | ICD-10-CM

## 2022-04-15 DIAGNOSIS — Z5900 Homelessness unspecified: Secondary | ICD-10-CM | POA: Insufficient documentation

## 2022-04-15 DIAGNOSIS — X31XXXA Exposure to excessive natural cold, initial encounter: Secondary | ICD-10-CM | POA: Insufficient documentation

## 2022-04-15 DIAGNOSIS — Z0489 Encounter for examination and observation for other specified reasons: Secondary | ICD-10-CM | POA: Insufficient documentation

## 2022-04-15 DIAGNOSIS — R079 Chest pain, unspecified: Secondary | ICD-10-CM | POA: Insufficient documentation

## 2022-04-15 DIAGNOSIS — R5383 Other fatigue: Secondary | ICD-10-CM | POA: Insufficient documentation

## 2022-04-15 LAB — BASIC METABOLIC PANEL
Anion gap: 10 (ref 5–15)
BUN: 9 mg/dL (ref 6–20)
CO2: 28 mmol/L (ref 22–32)
Calcium: 9.4 mg/dL (ref 8.9–10.3)
Chloride: 104 mmol/L (ref 98–111)
Creatinine, Ser: 0.71 mg/dL (ref 0.61–1.24)
GFR, Estimated: >60 mL/min (ref >60)
Glucose, Bld: 91 mg/dL (ref 70–99)
Potassium: 3 mmol/L — ABNORMAL LOW (ref 3.5–5.1)
Sodium: 142 mmol/L (ref 135–145)

## 2022-04-15 LAB — CBC
HCT: 43.4 % (ref 39.0–52.0)
Hemoglobin: 14.2 g/dL (ref 13.0–17.0)
MCH: 33.1 pg (ref 26.0–34.0)
MCHC: 32.7 g/dL (ref 30.0–36.0)
MCV: 101.2 fL — ABNORMAL HIGH (ref 80.0–100.0)
Platelets: 317 10*3/uL (ref 150–400)
RBC: 4.29 MIL/uL (ref 4.22–5.81)
RDW: 12.1 % (ref 11.5–15.5)
WBC: 6.4 10*3/uL (ref 4.0–10.5)
nRBC: 0 % (ref 0.0–0.2)

## 2022-04-15 LAB — TROPONIN I (HIGH SENSITIVITY): Troponin I (High Sensitivity): 5 ng/L (ref <18)

## 2022-04-15 MED ORDER — ACETAMINOPHEN 325 MG PO TABS
650.0000 mg | ORAL_TABLET | Freq: Once | ORAL | Status: AC
  Administered 2022-04-15: 650 mg via ORAL
  Filled 2022-04-15: qty 2

## 2022-04-15 NOTE — Discharge Instructions (Signed)
Return for any problem.  ?

## 2022-04-15 NOTE — ED Provider Notes (Signed)
[facility]-EMERGENCY DEPT Provider Note   CSN: [id number] Arrival date & time: 04/15/22  1749     History  Chief Complaint  Patient presents with   Cold Exposure   Chest Pain    Shane Small is a 36 y.o. male.  36 year old male with prior medical history as detailed below presents for evaluation.  Patient reports that he had to walk through the cold in the rain today to get to work.  Patient reports that he is currently homeless.  Patient reports that he has been sleeping at the [facility] but it has been close for the last 2 days.  Inclement weather for the last 24 hours is rainy with temperatures around 50 degrees.  Patient is aware of other shelter options in the [place] area.  Patient has not been able to find a bed to sleep and for the last 2 days that was warm and comfortable.  Patient came to the ED both last night and then again this evening for warmth and shelter.  Patient denies other complaint.  He specifically denies chest pain, shortness of breath, abdominal pain, other complaint.  He is feeling significantly improved with dry clothes on.    The history is provided by the patient and medical records.       Home Medications Prior to Admission medications   Medication Sig Start Date End Date Taking? Authorizing Provider  albuterol (VENTOLIN HFA) 108 (90 Base) MCG/ACT inhaler Inhale 1-2 puffs into the lungs every 6 (six) hours as needed for wheezing or shortness of breath. 03/03/22   Palumbo, April, MD  ARIPiprazole (ABILIFY) 15 MG tablet Take 0.5 tablets (7.5 mg total) by mouth daily. 04/06/22   Lenard Lance, FNP  diphenhydrAMINE (SOMINEX) 25 MG tablet Take 25 mg by mouth daily.    [provider]  erythromycin ophthalmic ointment Place a 1/2 inch ribbon of ointment into the lower eyelid bid. 03/03/22   Palumbo, April, MD  hydrOXYzine (ATARAX) 25 MG tablet Take 1 tablet (25 mg total) by mouth 3 (three) times daily as needed for  anxiety. Patient not taking: Reported on 02/07/2022 01/07/22   Rankin, Shuvon B, NP  loratadine (CLARITIN) 10 MG tablet Take 1 tablet (10 mg total) by mouth daily. Patient not taking: Reported on 02/07/2022 01/08/22   Rankin, Shuvon B, NP  Naphazoline HCl (CLEAR EYES OP) Place 1 drop into both eyes daily as needed (For dry eyes).    [provider]  traZODone (DESYREL) 50 MG tablet Take 1 tablet (50 mg total) by mouth at bedtime as needed for sleep. Patient not taking: Reported on 02/07/2022 01/07/22   Rankin, Shuvon B, NP  atomoxetine (STRATTERA) 10 MG capsule Take 10 mg by mouth daily.  10/31/19  [provider]  omeprazole (PRILOSEC) 20 MG capsule Take 1 capsule (20 mg total) by mouth 2 (two) times daily before a meal. 09/19/19 10/31/19  Darr, Gerilyn Pilgrim, PA-C      Allergies    Shellfish allergy    Review of Systems   Review of Systems  All other systems reviewed and are negative.   Physical Exam Updated Vital Signs BP 135/89 (BP Location: Left Arm)   Pulse 77   Temp 97.9 F (36.6 C) (Oral)   Resp 18   Ht 5\' 11"  (1.803 m)   Wt 72.6 kg   SpO2 100%   BMI 22.32 kg/m  Physical Exam Vitals and nursing note reviewed.  Constitutional:      General: He  is not in acute distress.    Appearance: Normal appearance. He is well-developed.  HENT:     Head: Normocephalic and atraumatic.  Eyes:     Conjunctiva/sclera: Conjunctivae normal.     Pupils: Pupils are equal, round, and reactive to light.  Cardiovascular:     Rate and Rhythm: Normal rate and regular rhythm.     Heart sounds: Normal heart sounds.  Pulmonary:     Effort: Pulmonary effort is normal. No respiratory distress.     Breath sounds: Normal breath sounds.  Abdominal:     General: There is no distension.     Palpations: Abdomen is soft.     Tenderness: There is no abdominal tenderness.  Musculoskeletal:        General: No deformity. Normal range of motion.     Cervical back: Normal range of motion and neck  supple.  Skin:    General: Skin is warm and dry.  Neurological:     General: No focal deficit present.     Mental Status: He is alert and oriented to person, place, and time.     ED Results / Procedures / Treatments   Labs (all labs ordered are listed, but only abnormal results are displayed) Labs Reviewed  BASIC METABOLIC PANEL - Abnormal; Notable for the following components:      Result Value   Potassium 3.0 (*)    All other components within normal limits  CBC - Abnormal; Notable for the following components:   MCV 101.2 (*)    All other components within normal limits  TROPONIN I (HIGH SENSITIVITY)  TROPONIN I (HIGH SENSITIVITY)    EKG EKG Interpretation  Date/Time:  Sunday April 15 2022 19:15:14 EST Ventricular Rate:  80 PR Interval:  169 QRS Duration: 78 QT Interval:  359 QTC Calculation: 415 R Axis:   85 Text Interpretation: Sinus rhythm Confirmed by Kristine Royal [phone]) on 04/15/2022 9:13:03 PM  Radiology DG Chest 2 View  Result Date: 04/15/2022 CLINICAL DATA:  Chest pain EXAM: CHEST - 2 VIEW COMPARISON:  Chest x-ray March 26, 2016 FINDINGS: The cardiomediastinal silhouette is unchanged in contour. No focal pulmonary opacity. No pleural effusion or pneumothorax. The visualized upper abdomen is unremarkable. No acute osseous abnormality. IMPRESSION: No acute cardiopulmonary abnormality. Electronically Signed   By: Jacob Moores M.D.   On: 04/15/2022 18:33    Procedures Procedures    Medications Ordered in ED Medications - No data to display  ED Course/ Medical Decision Making/ A&P                           Medical Decision Making   Medical Screen Complete  This patient presented to the ED with complaint of homeless.  This complaint involves an extensive number of treatment options. The initial differential diagnosis includes, but is not limited to, homlessness  This presentation is: Chronic, Self-Limited, Previously Undiagnosed,  Uncertain Prognosis, and Complicated  Patient is homeless.  Patient is presenting to the ED so that he can get out of the rain and cold weather.  He is otherwise without medical complaint.  He feels significantly improved after getting dry scrubs.  Patient does understand and is aware of homelessness resources in the [place] area.  He declines social work consult.  Patient without indication for additional ER evaluation.  Patient did not endorse chest pain to this provider.  Workup obtained did not demonstrate evidence of acute pathology.  Specifically EKG is without  evidence of acute ischemia.  Troponin x 1 is barely detectable.  Patient is appropriate for discharge.  Importance of close follow-up is stressed.  Strict return precautions given and understood.  Additional history obtained:  External records from outside sources obtained and reviewed including prior ED visits and prior Inpatient records.    Lab Tests:  I ordered and personally interpreted labs.  The pertinent results include: CBC, BMP, troponin   Imaging Studies ordered:  I ordered imaging studies including chest x-ray I independently visualized and interpreted obtained imaging which showed NAD I agree with the radiologist interpretation. Problem List / ED Course:  Homelessness   Reevaluation:  After the interventions noted above, I reevaluated the patient and found that they have: stayed the same   Disposition:  After consideration of the diagnostic results and the patients response to treatment, I feel that the patent would benefit from close outpatient follow-up.          Final Clinical Impression(s) / ED Diagnoses Final diagnoses:  Homelessness    Rx / DC Orders ED Discharge Orders     None         Wynetta Fines, MD 04/15/22 2126

## 2022-04-15 NOTE — ED Provider Notes (Signed)
[facility]-EMERGENCY DEPT Provider Note   CSN: [id number] Arrival date & time: 04/15/22  0049     History  Chief Complaint  Patient presents with   Fatigue    Shane Small is a 36 y.o. male, history of paranoid schizophrenia, who presents to the ED secondary to being cold outside, and feeling more tired.  He states that he feels better now that he is in the warmth, and would like some food.  He has no other complaints.      Home Medications Prior to Admission medications   Medication Sig Start Date End Date Taking? Authorizing Provider  albuterol (VENTOLIN HFA) 108 (90 Base) MCG/ACT inhaler Inhale 1-2 puffs into the lungs every 6 (six) hours as needed for wheezing or shortness of breath. 03/03/22   Palumbo, April, MD  ARIPiprazole (ABILIFY) 15 MG tablet Take 0.5 tablets (7.5 mg total) by mouth daily. 04/06/22   Lenard Lance, FNP  diphenhydrAMINE (SOMINEX) 25 MG tablet Take 25 mg by mouth daily.    [provider]  erythromycin ophthalmic ointment Place a 1/2 inch ribbon of ointment into the lower eyelid bid. 03/03/22   Palumbo, April, MD  hydrOXYzine (ATARAX) 25 MG tablet Take 1 tablet (25 mg total) by mouth 3 (three) times daily as needed for anxiety. Patient not taking: Reported on 02/07/2022 01/07/22   Rankin, Shuvon B, NP  loratadine (CLARITIN) 10 MG tablet Take 1 tablet (10 mg total) by mouth daily. Patient not taking: Reported on 02/07/2022 01/08/22   Rankin, Shuvon B, NP  Naphazoline HCl (CLEAR EYES OP) Place 1 drop into both eyes daily as needed (For dry eyes).    [provider]  traZODone (DESYREL) 50 MG tablet Take 1 tablet (50 mg total) by mouth at bedtime as needed for sleep. Patient not taking: Reported on 02/07/2022 01/07/22   Rankin, Shuvon B, NP  atomoxetine (STRATTERA) 10 MG capsule Take 10 mg by mouth daily.  10/31/19  [provider]  omeprazole (PRILOSEC) 20 MG capsule Take 1 capsule (20 mg total) by mouth 2 (two)  times daily before a meal. 09/19/19 10/31/19  Darr, Gerilyn Pilgrim, PA-C      Allergies    Shellfish allergy    Review of Systems   Review of Systems  Constitutional:  Positive for fatigue. Negative for chills and fever.  HENT:  Negative for ear pain and sore throat.   Eyes:  Negative for pain and visual disturbance.  Respiratory:  Negative for cough and shortness of breath.   Cardiovascular:  Negative for chest pain and palpitations.  Gastrointestinal:  Negative for abdominal pain and vomiting.  Genitourinary:  Negative for dysuria and hematuria.  Musculoskeletal:  Negative for arthralgias and back pain.  Skin:  Negative for color change and rash.  Neurological:  Negative for seizures and syncope.  All other systems reviewed and are negative.   Physical Exam Updated Vital Signs BP (!) 152/88 (BP Location: Left Arm)   Pulse 81   Temp 98 F (36.7 C) (Oral)   Resp 16   Ht 5\' 11"  (1.803 m)   Wt 72.6 kg   SpO2 95%   BMI 22.32 kg/m  Physical Exam Vitals and nursing note reviewed.  Constitutional:      General: He is not in acute distress.    Appearance: He is well-developed.  HENT:     Head: Normocephalic and atraumatic.  Eyes:     Conjunctiva/sclera: Conjunctivae normal.  Cardiovascular:     Rate  and Rhythm: Normal rate and regular rhythm.     Heart sounds: No murmur heard. Pulmonary:     Effort: Pulmonary effort is normal. No respiratory distress.     Breath sounds: Normal breath sounds.  Abdominal:     Palpations: Abdomen is soft.     Tenderness: There is no abdominal tenderness.  Musculoskeletal:        General: No swelling.     Cervical back: Neck supple.  Skin:    General: Skin is warm and dry.     Capillary Refill: Capillary refill takes less than 2 seconds.  Neurological:     Mental Status: He is alert.  Psychiatric:        Mood and Affect: Mood normal.     ED Results / Procedures / Treatments   Labs (all labs ordered are listed, but only abnormal results are  displayed) Labs Reviewed - No data to display  EKG None  Radiology No results found.  Procedures Procedures    Medications Ordered in ED Medications  acetaminophen (TYLENOL) tablet 650 mg (has no administration in time range)    ED Course/ Medical Decision Making/ A&P                           Medical Decision Making Patient is a 36 year old male, here for fatigue after being outside all day.  He states he is feeling better being in the warmth, and he has no chest pain, shortness of breath, abdominal pain, urinary complaints.  He is well-appearing and he has no acute abnormalities.  He states he would like somewhere to stay.  I given resources for homelessness, and instructed to follow-up with PCP as needed.  Will give Tylenol and food given fatigue.   Final Clinical Impression(s) / ED Diagnoses Final diagnoses:  Encounter for medical screening examination    Rx / DC Orders ED Discharge Orders     None         Christyn Gutkowski, [place]ley [place], PA 04/15/22 1941    Arby Barrette, MD 04/16/22 1459

## 2022-04-15 NOTE — ED Provider Triage Note (Signed)
Emergency Medicine Provider Triage Evaluation Note  Shane Small , a 36 y.o. male  was evaluated in triage.  Pt complains of cold exposure and chest pain.  Patient was seen overnight and discharged this morning from the ED.  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  Patient with me today for chest pain regarding hour prior to arrival, comes and goes, feels sharp.  No nausea or vomiting, no radiation.  No history of PE or ACS.Marland Kitchen  Review of Systems  Per HPI I  Physical Exam  BP 135/89 (BP Location: Left Arm)   Pulse 77   Temp 97.9 F (36.6 C) (Oral)   Resp 18   SpO2 100%  Gen:   Awake, no distress   Resp:  Normal effort  MSK:   Moves extremities without difficulty  Other:  Normal cap refill, moist mucous membranes and good skin turgor.  Regular rate and rhythm  Medical Decision Making  Medically screening exam initiated at 6:08 PM.  Appropriate orders placed.  [name] was informed that the remainder of the evaluation will be completed by another provider, this initial triage assessment does not replace that evaluation, and the importance of remaining in the ED until their evaluation is complete.  Patient is not hypothermic, no signs of thermal injury from cold exposure.  He is having new chest pains we will check labs, EKG and chest x-ray.   Theron Arista, PA-C 04/15/22 3244

## 2022-04-15 NOTE — ED Triage Notes (Signed)
States that [place] to the homeless [place] and got tired so he called EMS

## 2022-04-15 NOTE — Discharge Instructions (Signed)
Please follow-up with your primary care doctor, if you start having severe chest pain, shortness of breath, worsening medical conditions please return to the ER.

## 2022-04-15 NOTE — ED Triage Notes (Signed)
Per EMS- patient had been walking to work for 2 hours and EMS was called when patient arrived to work. Patient c/o cold exposure, T. 97.6

## 2022-05-11 ENCOUNTER — Encounter (HOSPITAL_COMMUNITY): Payer: Self-pay | Admitting: Emergency Medicine

## 2022-05-11 ENCOUNTER — Other Ambulatory Visit: Payer: Self-pay

## 2022-05-11 ENCOUNTER — Emergency Department (HOSPITAL_COMMUNITY)
Admission: EM | Admit: 2022-05-11 | Discharge: 2022-05-12 | Discharge status: Against Medical Advice | Payer: Self-pay | Attending: Physician Assistant | Admitting: Physician Assistant

## 2022-05-11 DIAGNOSIS — Z5321 Procedure and treatment not carried out due to patient leaving prior to being seen by health care provider: Secondary | ICD-10-CM | POA: Insufficient documentation

## 2022-05-11 DIAGNOSIS — R21 Rash and other nonspecific skin eruption: Secondary | ICD-10-CM | POA: Insufficient documentation

## 2022-05-11 MED ORDER — LIDOCAINE HCL (PF) 1 % IJ SOLN: 10.0000 mL | Freq: Once | INTRAMUSCULAR | Status: DC

## 2022-05-11 NOTE — ED Provider Triage Note (Signed)
Emergency Medicine Provider Triage Evaluation Note  Shane Small , a 37 y.o. male  was evaluated in triage.  Pt complains of an abscess right underarm and rash all over.  Pt reports he thinks rash started after eating crawfish a month ago.   Review of Systems  Positive: rash Negative: fever  Physical Exam  BP 132/87 (BP Location: Right Arm)   Pulse 84   Temp 97.9 F (36.6 C) (Oral)   Resp 16   Wt 72 kg   SpO2 100%   BMI 22.14 kg/m  Gen:   Awake, no distress   Resp:  Normal effort  MSK:   Abscess right axilla  Other:    Medical Decision Making  Medically screening exam initiated at 10:48 PM.  Appropriate orders placed.  Armond Hang was informed that the remainder of the evaluation will be completed by another provider, this initial triage assessment does not replace that evaluation, and the importance of remaining in the ED until their evaluation is complete.     Fransico Meadow, [place] 05/12/22 0004

## 2022-05-11 NOTE — ED Triage Notes (Signed)
Skin eruptions noted over stomach and and forearms, also swollen nondraining areas to R axilla. Endorse pruritus.  Denies fever, chills.  He would like staff to know he has a crawfish taste in his mouth currently but has not eaten shellfish in a mo.  Denies SOB, trouble swallowing  Has  had benadryl today at 6pm, takes daily. Does not have a home epi pen

## 2022-05-16 ENCOUNTER — Other Ambulatory Visit: Payer: Self-pay

## 2022-05-16 ENCOUNTER — Encounter (HOSPITAL_COMMUNITY): Payer: Self-pay

## 2022-05-16 ENCOUNTER — Emergency Department (HOSPITAL_COMMUNITY)
Admission: EM | Admit: 2022-05-16 | Discharge: 2022-05-16 | Disposition: A | Discharge status: Home / Self Care | Payer: Self-pay | Attending: Emergency Medicine | Admitting: Emergency Medicine

## 2022-05-16 DIAGNOSIS — L02411 Cutaneous abscess of right axilla: Secondary | ICD-10-CM | POA: Insufficient documentation

## 2022-05-16 DIAGNOSIS — L0291 Cutaneous abscess, unspecified: Secondary | ICD-10-CM

## 2022-05-16 DIAGNOSIS — R21 Rash and other nonspecific skin eruption: Secondary | ICD-10-CM

## 2022-05-16 MED ORDER — DOXYCYCLINE HYCLATE 100 MG PO CAPS: 100.0000 mg | ORAL_CAPSULE | Freq: Two times a day (BID) | ORAL | 0 refills | Status: DC

## 2022-05-16 MED ORDER — LIDOCAINE-EPINEPHRINE (PF) 2 %-1:200000 IJ SOLN
10.0000 mL | Freq: Once | INTRAMUSCULAR | Status: AC
  Administered 2022-05-16: 10 mL via INTRADERMAL
  Filled 2022-05-16: qty 20

## 2022-05-16 NOTE — ED Provider Notes (Signed)
[facility] DEPT Provider Note   CSN: [id number] Arrival date & time: 05/16/22  1106     History  Chief Complaint  Patient presents with   Rash    Shane Small is a 37 y.o. male.  40 yoM with a chief complaint of rash to his right armpit.  Has been going on for couple weeks.  He said it was initially very irritated he had been applying different creams to it and thinks maybe they have reacted to the creams.  He has had some subjective fevers and chills with it.  Denies night sweats denies weight loss.  Seem to think it was worse with certain things that he ate.  Was worried that he had an allergy to crawfish.   Rash      Home Medications Prior to Admission medications   Medication Sig Start Date End Date Taking? Authorizing Provider  doxycycline (VIBRAMYCIN) 100 MG capsule Take 1 capsule (100 mg total) by mouth 2 (two) times daily. One po bid x 7 days 05/16/22  Yes Deno Etienne, DO  albuterol (VENTOLIN HFA) 108 (90 Base) MCG/ACT inhaler Inhale 1-2 puffs into the lungs every 6 (six) hours as needed for wheezing or shortness of breath. 03/03/22   Palumbo, April, MD  ARIPiprazole (ABILIFY) 15 MG tablet Take 0.5 tablets (7.5 mg total) by mouth daily. 04/06/22   Lucky Rathke, FNP  diphenhydrAMINE (SOMINEX) 25 MG tablet Take 25 mg by mouth daily.    [provider]  erythromycin ophthalmic ointment Place a 1/2 inch ribbon of ointment into the lower eyelid bid. 03/03/22   Palumbo, April, MD  hydrOXYzine (ATARAX) 25 MG tablet Take 1 tablet (25 mg total) by mouth 3 (three) times daily as needed for anxiety. Patient not taking: Reported on 02/07/2022 01/07/22   Rankin, Shuvon B, NP  loratadine (CLARITIN) 10 MG tablet Take 1 tablet (10 mg total) by mouth daily. Patient not taking: Reported on 02/07/2022 01/08/22   Rankin, Shuvon B, NP  Naphazoline HCl (CLEAR EYES OP) Place 1 drop into both eyes daily as needed (For dry eyes).    [provider]   traZODone (DESYREL) 50 MG tablet Take 1 tablet (50 mg total) by mouth at bedtime as needed for sleep. Patient not taking: Reported on 02/07/2022 01/07/22   Rankin, Shuvon B, NP  atomoxetine (STRATTERA) 10 MG capsule Take 10 mg by mouth daily.  10/31/19  [provider]  omeprazole (PRILOSEC) 20 MG capsule Take 1 capsule (20 mg total) by mouth 2 (two) times daily before a meal. 09/19/19 10/31/19  Darr, Edison Nasuti, PA-C      Allergies    Shellfish allergy    Review of Systems   Review of Systems  Skin:  Positive for rash.    Physical Exam Updated Vital Signs BP 121/76 (BP Location: Left Arm)   Pulse 86   Temp 98.3 F (36.8 C) (Oral)   Resp 16   Ht 5\' 10"  (1.778 m)   Wt 79 kg   SpO2 92%   BMI 24.99 kg/m  Physical Exam Vitals and nursing note reviewed.  Constitutional:      Appearance: He is well-developed.  HENT:     Head: Normocephalic and atraumatic.  Eyes:     Pupils: Pupils are equal, round, and reactive to light.  Neck:     Vascular: No JVD.  Cardiovascular:     Rate and Rhythm: Normal rate and regular rhythm.     Heart sounds: No  murmur heard.    No friction rub. No gallop.  Pulmonary:     Effort: No respiratory distress.     Breath sounds: No wheezing.  Abdominal:     General: There is no distension.     Tenderness: There is no abdominal tenderness. There is no guarding or rebound.  Musculoskeletal:        General: Normal range of motion.     Cervical back: Normal range of motion and neck supple.  Skin:    Coloration: Skin is not pale.     Findings: No rash.     Comments: Right axilla with 3 separate bands of irritated areas, slightly erythematous but mostly skin colored.  There are some areas of pustules especially about the middle area.  Small amount of fluctuance.  Neurological:     Mental Status: He is alert and oriented to person, place, and time.  Psychiatric:        Behavior: Behavior normal.     ED Results / Procedures / Treatments   Labs (all  labs ordered are listed, but only abnormal results are displayed) Labs Reviewed - No data to display  EKG None  Radiology No results found.  Procedures .Marland KitchenIncision and Drainage  Date/Time: 05/16/2022 12:42 PM  Performed by: Melene Plan, DO Authorized by: Melene Plan, DO   Consent:    Consent obtained:  Verbal   Consent given by:  Patient   Risks, benefits, and alternatives were discussed: yes     Risks discussed:  Bleeding, incomplete drainage and infection   Alternatives discussed:  No treatment, delayed treatment and alternative treatment Universal protocol:    Procedure explained and questions answered to patient or proxy's satisfaction: yes     Immediately prior to procedure, a time out was called: yes     Patient identity confirmed:  Verbally with patient Location:    Type:  Abscess Pre-procedure details:    Skin preparation:  Chlorhexidine Sedation:    Sedation type:  None Anesthesia:    Anesthesia method:  Local infiltration   Local anesthetic:  Lidocaine 2% WITH epi Procedure type:    Complexity:  Complex Procedure details:    Incision types:  Single straight   Incision depth:  Subcutaneous   Wound management:  Probed and deloculated   Drainage:  Purulent   Drainage amount:  Scant   Wound treatment:  Wound left open   Packing materials:  None Post-procedure details:    Procedure completion:  Tolerated well, no immediate complications     Medications Ordered in ED Medications  lidocaine-EPINEPHrine (XYLOCAINE W/EPI) 2 %-1:200000 (PF) injection 10 mL (10 mLs Intradermal Given 05/16/22 1213)    ED Course/ Medical Decision Making/ A&P                             Medical Decision Making Risk Prescription drug management.   36 yoM with a cc of rash to the R axilla.  ? Abscess, some pustules. Could be contact dermatitis but with pustules will attempt I&D.  I&D performed at the area of the pustule informed.  Was purulent drainage.  No obvious connection  between the other areas.  Multiple nodules, will hold off doing 10+ I&D's.  Will start him on antibiotics warm compresses have him abstain from using the topical medications as they may be causing a contact dermatitis.  PCP follow-up.  12:44 PM:  I have discussed the diagnosis/risks/treatment options with the patient.  Evaluation and diagnostic testing in the emergency department does not suggest an emergent condition requiring admission or immediate intervention beyond what has been performed at this time.  They will follow up with PCP. We also discussed returning to the ED immediately if new or worsening sx occur. We discussed the sx which are most concerning (e.g., sudden worsening pain, fever, inability to tolerate by mouth ) that necessitate immediate return. Medications administered to the patient during their visit and any new prescriptions provided to the patient are listed below.  Medications given during this visit Medications  lidocaine-EPINEPHrine (XYLOCAINE W/EPI) 2 %-1:200000 (PF) injection 10 mL (10 mLs Intradermal Given 05/16/22 1213)     The patient appears reasonably screen and/or stabilized for discharge and I doubt any other medical condition or other [facility] requiring further screening, evaluation, or treatment in the ED at this time prior to discharge.          Final Clinical Impression(s) / ED Diagnoses Final diagnoses:  Rash and nonspecific skin eruption  Abscess    Rx / DC Orders ED Discharge Orders          Ordered    doxycycline (VIBRAMYCIN) 100 MG capsule  2 times daily        05/16/22 [place], [place], DO 05/16/22 1244

## 2022-05-16 NOTE — ED Triage Notes (Signed)
Pt reports rash to R axilla x two weeks. "Itchy"

## 2022-05-16 NOTE — ED Notes (Signed)
Patient denies pain and is resting comfortably.  

## 2022-05-16 NOTE — Discharge Instructions (Signed)
Warm compresses at least 4 times a day.  Take the antibiotics as prescribed.  I would have you abstain from applying anything other than soap and water to the area.  This includes deodorant or any other topical thing that you were applying.  Please follow-up with your family doctor in the office.  Please return for rapid spreading or if you develop a fever.

## 2022-08-22 ENCOUNTER — Emergency Department (HOSPITAL_COMMUNITY)
Admission: EM | Admit: 2022-08-22 | Discharge: 2022-08-22 | Disposition: A | Discharge status: Home / Self Care | Payer: Self-pay | Attending: Emergency Medicine | Admitting: Emergency Medicine

## 2022-08-22 ENCOUNTER — Emergency Department (HOSPITAL_COMMUNITY): Payer: Self-pay

## 2022-08-22 ENCOUNTER — Other Ambulatory Visit: Payer: Self-pay

## 2022-08-22 ENCOUNTER — Encounter (HOSPITAL_COMMUNITY): Payer: Self-pay

## 2022-08-22 DIAGNOSIS — E876 Hypokalemia: Secondary | ICD-10-CM

## 2022-08-22 DIAGNOSIS — D72829 Elevated white blood cell count, unspecified: Secondary | ICD-10-CM | POA: Insufficient documentation

## 2022-08-22 DIAGNOSIS — J189 Pneumonia, unspecified organism: Secondary | ICD-10-CM

## 2022-08-22 DIAGNOSIS — E8809 Other disorders of plasma-protein metabolism, not elsewhere classified: Secondary | ICD-10-CM | POA: Insufficient documentation

## 2022-08-22 DIAGNOSIS — R000 Tachycardia, unspecified: Secondary | ICD-10-CM | POA: Insufficient documentation

## 2022-08-22 LAB — CBC WITH DIFFERENTIAL/PLATELET
Abs Immature Granulocytes: 0.1 10*3/uL — ABNORMAL HIGH (ref 0.00–0.07)
Basophils Absolute: 0 10*3/uL (ref 0.0–0.1)
Basophils Relative: 0 %
Eosinophils Absolute: 0.1 10*3/uL (ref 0.0–0.5)
Eosinophils Relative: 0 %
HCT: 38.5 % — ABNORMAL LOW (ref 39.0–52.0)
Hemoglobin: 12.6 g/dL — ABNORMAL LOW (ref 13.0–17.0)
Immature Granulocytes: 1 %
Lymphocytes Relative: 7 %
Lymphs Abs: 1 10*3/uL (ref 0.7–4.0)
MCH: 32.1 pg (ref 26.0–34.0)
MCHC: 32.7 g/dL (ref 30.0–36.0)
MCV: 98.2 fL (ref 80.0–100.0)
Monocytes Absolute: 1.2 10*3/uL — ABNORMAL HIGH (ref 0.1–1.0)
Monocytes Relative: 9 %
Neutro Abs: 11.1 10*3/uL — ABNORMAL HIGH (ref 1.7–7.7)
Neutrophils Relative %: 83 %
Platelets: 330 10*3/uL (ref 150–400)
RBC: 3.92 MIL/uL — ABNORMAL LOW (ref 4.22–5.81)
RDW: 11.7 % (ref 11.5–15.5)
WBC: 13.5 10*3/uL — ABNORMAL HIGH (ref 4.0–10.5)
nRBC: 0 % (ref 0.0–0.2)

## 2022-08-22 LAB — HEPATIC FUNCTION PANEL
ALT: 13 U/L (ref 0–44)
AST: 15 U/L (ref 15–41)
Albumin: 2.8 g/dL — ABNORMAL LOW (ref 3.5–5.0)
Alkaline Phosphatase: 44 U/L (ref 38–126)
Bilirubin, Direct: 0.2 mg/dL (ref 0.0–0.2)
Indirect Bilirubin: 0.9 mg/dL (ref 0.3–0.9)
Total Bilirubin: 1.1 mg/dL (ref 0.3–1.2)
Total Protein: 5.9 g/dL — ABNORMAL LOW (ref 6.5–8.1)

## 2022-08-22 LAB — BASIC METABOLIC PANEL
Anion gap: 8 (ref 5–15)
BUN: 13 mg/dL (ref 6–20)
CO2: 19 mmol/L — ABNORMAL LOW (ref 22–32)
Calcium: 6.4 mg/dL — CL (ref 8.9–10.3)
Chloride: 107 mmol/L (ref 98–111)
Creatinine, Ser: 0.7 mg/dL (ref 0.61–1.24)
GFR, Estimated: >60 mL/min (ref >60)
Glucose, Bld: 91 mg/dL (ref 70–99)
Potassium: 2.9 mmol/L — ABNORMAL LOW (ref 3.5–5.1)
Sodium: 134 mmol/L — ABNORMAL LOW (ref 135–145)

## 2022-08-22 LAB — I-STAT CHEM 8, ED
BUN: 13 mg/dL (ref 6–20)
Calcium, Ion: 1.12 mmol/L — ABNORMAL LOW (ref 1.15–1.40)
Chloride: 104 mmol/L (ref 98–111)
Creatinine, Ser: 0.7 mg/dL (ref 0.61–1.24)
Glucose, Bld: 106 mg/dL — ABNORMAL HIGH (ref 70–99)
HCT: 36 % — ABNORMAL LOW (ref 39.0–52.0)
Hemoglobin: 12.2 g/dL — ABNORMAL LOW (ref 13.0–17.0)
Potassium: 4.3 mmol/L (ref 3.5–5.1)
Sodium: 137 mmol/L (ref 135–145)
TCO2: 24 mmol/L (ref 22–32)

## 2022-08-22 LAB — MAGNESIUM: Magnesium: 1.4 mg/dL — ABNORMAL LOW (ref 1.7–2.4)

## 2022-08-22 LAB — D-DIMER, QUANTITATIVE: D-Dimer, Quant: 0.63 ug/mL-FEU — ABNORMAL HIGH (ref 0.00–0.50)

## 2022-08-22 LAB — PHOSPHORUS: Phosphorus: 1.8 mg/dL — ABNORMAL LOW (ref 2.5–4.6)

## 2022-08-22 LAB — GROUP A STREP BY PCR: Group A Strep by PCR: NOT DETECTED

## 2022-08-22 MED ORDER — IOHEXOL 350 MG/ML SOLN
75.0000 mL | Freq: Once | INTRAVENOUS | Status: AC | PRN
  Administered 2022-08-22: 75 mL via INTRAVENOUS

## 2022-08-22 MED ORDER — SODIUM CHLORIDE 0.9 % IV BOLUS
500.0000 mL | Freq: Once | INTRAVENOUS | Status: AC
  Administered 2022-08-22: 500 mL via INTRAVENOUS

## 2022-08-22 MED ORDER — AMOXICILLIN-POT CLAVULANATE 875-125 MG PO TABS: 1.0000 | ORAL_TABLET | Freq: Two times a day (BID) | ORAL | 0 refills | Status: DC

## 2022-08-22 MED ORDER — ACETAMINOPHEN 500 MG PO TABS
1000.0000 mg | ORAL_TABLET | Freq: Once | ORAL | Status: AC
  Administered 2022-08-22: 1000 mg via ORAL
  Filled 2022-08-22: qty 2

## 2022-08-22 MED ORDER — SODIUM CHLORIDE (PF) 0.9 % IJ SOLN
INTRAMUSCULAR | Status: AC
  Filled 2022-08-22: qty 50

## 2022-08-22 MED ORDER — POTASSIUM CHLORIDE CRYS ER 20 MEQ PO TBCR
40.0000 meq | EXTENDED_RELEASE_TABLET | Freq: Once | ORAL | Status: AC
  Administered 2022-08-22: 40 meq via ORAL
  Filled 2022-08-22: qty 2

## 2022-08-22 MED ORDER — MAGNESIUM SULFATE 2 GM/50ML IV SOLN
2.0000 g | Freq: Once | INTRAVENOUS | Status: AC
  Administered 2022-08-22: 2 g via INTRAVENOUS
  Filled 2022-08-22: qty 50

## 2022-08-22 MED ORDER — DOXYCYCLINE HYCLATE 100 MG PO CAPS: 100.0000 mg | ORAL_CAPSULE | Freq: Two times a day (BID) | ORAL | 0 refills | Status: AC

## 2022-08-22 MED ORDER — POTASSIUM CHLORIDE CRYS ER 20 MEQ PO TBCR: 20.0000 meq | EXTENDED_RELEASE_TABLET | Freq: Two times a day (BID) | ORAL | 0 refills | Status: DC

## 2022-08-22 MED ORDER — BENZONATATE 100 MG PO CAPS: 100.0000 mg | ORAL_CAPSULE | Freq: Three times a day (TID) | ORAL | 0 refills | Status: DC

## 2022-08-22 MED ORDER — POTASSIUM CHLORIDE 10 MEQ/100ML IV SOLN
10.0000 meq | INTRAVENOUS | Status: AC
  Administered 2022-08-22 (×2): 10 meq via INTRAVENOUS
  Filled 2022-08-22 (×2): qty 100

## 2022-08-22 NOTE — ED Triage Notes (Signed)
Pt to er, pt states that he has a productive cough for the past week, states that when he coughs he also gets a headache, pt talking in full sentences, resps even and unlabored, pt also c/o chills

## 2022-08-22 NOTE — Discharge Instructions (Addendum)
It was a pleasure taking care of you today!  Your labs today show concern for decreased calcium, potassium, magnesium, these were repleted in the emergency department.  Your CT scan showed concerns today for possible developing pneumonia, you will be sent a prescription for Augmentin and doxycycline, take as prescribed.  Ensure to maintain fluid intake with water, tea, broth, soup, Gatorade, Pedialyte. You will be sent home with a prescription for potassium. Take as prescribed. Attached is information for potassium rich foods.  You also be sent a prescription for [facility], this is to aid with your cough.  You may also take over-the-counter cough and cold medications as needed.  Follow up with your primary care provider in 1 week for potassium recheck.  Return to the emergency department if experiencing increasing/worsening symptoms.

## 2022-08-22 NOTE — ED Provider Notes (Signed)
[place] EMERGENCY DEPARTMENT AT [facility] Provider Note   CSN: [id number] Arrival date & time: 08/22/22  4098     History  Chief Complaint  Patient presents with   Cough    Shane Small is a 37 y.o. male who presents to the ED with concerns for cough onset 1 week. Notes that his cough has been productive with yellow sputum. Tried OTC cold meds for his symptoms. He notes that he is coming from the [facility] and there are possible sick contacts there. Has sore throat. Denies rhinorrhea, nasal congestion, trouble swallowing, chest pain, shortness of breath.   The history is provided by the patient. No language interpreter was used.       Home Medications Prior to Admission medications   Medication Sig Start Date End Date Taking? Authorizing Provider  amoxicillin-clavulanate (AUGMENTIN) 875-125 MG tablet Take 1 tablet by mouth every 12 (twelve) hours. 08/22/22  Yes China Deitrick A, PA-C  benzonatate (TESSALON) 100 MG capsule Take 1 capsule (100 mg total) by mouth every 8 (eight) hours. 08/22/22  Yes Deeksha Cotrell A, PA-C  potassium chloride SA (KLOR-CON M) 20 MEQ tablet Take 1 tablet (20 mEq total) by mouth 2 (two) times daily for 5 days. 08/22/22 08/27/22 Yes Kahmari Koller A, PA-C  albuterol (VENTOLIN HFA) 108 (90 Base) MCG/ACT inhaler Inhale 1-2 puffs into the lungs every 6 (six) hours as needed for wheezing or shortness of breath. 03/03/22   Palumbo, April, MD  ARIPiprazole (ABILIFY) 15 MG tablet Take 0.5 tablets (7.5 mg total) by mouth daily. 04/06/22   Lenard Lance, FNP  diphenhydrAMINE (SOMINEX) 25 MG tablet Take 25 mg by mouth daily.    [provider]  doxycycline (VIBRAMYCIN) 100 MG capsule Take 1 capsule (100 mg total) by mouth 2 (two) times daily for 5 days. One po bid x 7 days 08/22/22 08/27/22  Kaylia Winborne A, PA-C  erythromycin ophthalmic ointment Place a 1/2 inch ribbon of ointment into the lower eyelid bid. 03/03/22   Palumbo, April, MD  hydrOXYzine  (ATARAX) 25 MG tablet Take 1 tablet (25 mg total) by mouth 3 (three) times daily as needed for anxiety. Patient not taking: Reported on 02/07/2022 01/07/22   Rankin, Shuvon B, NP  loratadine (CLARITIN) 10 MG tablet Take 1 tablet (10 mg total) by mouth daily. Patient not taking: Reported on 02/07/2022 01/08/22   Rankin, Shuvon B, NP  Naphazoline HCl (CLEAR EYES OP) Place 1 drop into both eyes daily as needed (For dry eyes).    [provider]  traZODone (DESYREL) 50 MG tablet Take 1 tablet (50 mg total) by mouth at bedtime as needed for sleep. Patient not taking: Reported on 02/07/2022 01/07/22   Rankin, Shuvon B, NP  atomoxetine (STRATTERA) 10 MG capsule Take 10 mg by mouth daily.  10/31/19  [provider]  omeprazole (PRILOSEC) 20 MG capsule Take 1 capsule (20 mg total) by mouth 2 (two) times daily before a meal. 09/19/19 10/31/19  Darr, Gerilyn Pilgrim, PA-C      Allergies    Shellfish allergy    Review of Systems   Review of Systems  Respiratory:  Positive for cough.   All other systems reviewed and are negative.   Physical Exam Updated Vital Signs BP (!) 140/103 (BP Location: Right Arm)   Pulse (!) 113   Temp 98.7 F (37.1 C) (Oral)   Resp 18   Ht  (1.803 m)   Wt 68 kg   SpO2 92%  BMI 20.92 kg/m  Physical Exam Vitals and nursing note reviewed.  Constitutional:      General: He is not in acute distress.    Appearance: Normal appearance.  HENT:     Mouth/Throat:     Mouth: Mucous membranes are moist.     Pharynx: Oropharynx is clear. Uvula midline. No posterior oropharyngeal erythema.     Tonsils: No tonsillar exudate.     Comments: Uvula midline without swelling. No posterior pharyngeal erythema or tonsillar exudate noted. Patent airway. Pt able to speak in clear complete sentences. Tolerating oral secretions. Eyes:     General: No scleral icterus.    Extraocular Movements: Extraocular movements intact.  Cardiovascular:     Rate and Rhythm: Regular rhythm.  Tachycardia present.     Pulses: Normal pulses.     Heart sounds: Normal heart sounds.  Pulmonary:     Effort: Pulmonary effort is normal. No respiratory distress.     Breath sounds: Normal breath sounds.  Abdominal:     Palpations: Abdomen is soft. There is no mass.     Tenderness: There is no abdominal tenderness.  Musculoskeletal:        General: Normal range of motion.     Cervical back: Neck supple.  Skin:    General: Skin is warm and dry.     Findings: No rash.  Neurological:     Mental Status: He is alert.     Sensory: Sensation is intact.     Motor: Motor function is intact.  Psychiatric:        Behavior: Behavior normal.     ED Results / Procedures / Treatments   Labs (all labs ordered are listed, but only abnormal results are displayed) Labs Reviewed  D-DIMER, QUANTITATIVE - Abnormal; Notable for the following components:      Result Value   D-Dimer, Quant 0.63 (*)    All other components within normal limits  CBC WITH DIFFERENTIAL/PLATELET - Abnormal; Notable for the following components:   WBC 13.5 (*)    RBC 3.92 (*)    Hemoglobin 12.6 (*)    HCT 38.5 (*)    Neutro Abs 11.1 (*)    Monocytes Absolute 1.2 (*)    Abs Immature Granulocytes 0.10 (*)    All other components within normal limits  BASIC METABOLIC PANEL - Abnormal; Notable for the following components:   Sodium 134 (*)    Potassium 2.9 (*)    CO2 19 (*)    Calcium 6.4 (*)    All other components within normal limits  MAGNESIUM - Abnormal; Notable for the following components:   Magnesium 1.4 (*)    All other components within normal limits  PHOSPHORUS - Abnormal; Notable for the following components:   Phosphorus 1.8 (*)    All other components within normal limits  HEPATIC FUNCTION PANEL - Abnormal; Notable for the following components:   Total Protein 5.9 (*)    Albumin 2.8 (*)    All other components within normal limits  I-STAT CHEM 8, ED - Abnormal; Notable for the following  components:   Glucose, Bld 106 (*)    Calcium, Ion 1.12 (*)    Hemoglobin 12.2 (*)    HCT 36.0 (*)    All other components within normal limits  GROUP A STREP BY PCR  CALCIUM, IONIZED    EKG EKG Interpretation  Date/Time:  Wednesday August 22 2022 09:21:50 EDT Ventricular Rate:  87 PR Interval:  163 QRS Duration: 81 QT  Interval:  343 QTC Calculation: 413 R Axis:   92 Text Interpretation: Sinus rhythm RAE, consider biatrial enlargement Borderline right axis deviation No significant change since last tracing Confirmed by Margarita Grizzle [phone]) on 08/22/2022 9:48:58 AM  Radiology CT Angio Chest PE W and/or Wo Contrast  Result Date: 08/22/2022 CLINICAL DATA:  Evaluate for pulmonary embolism. Productive cough. Positive D-dimer. EXAM: CT ANGIOGRAPHY CHEST WITH CONTRAST TECHNIQUE: Multidetector CT imaging of the chest was performed using the standard protocol during bolus administration of intravenous contrast. Multiplanar CT image reconstructions and MIPs were obtained to evaluate the vascular anatomy. RADIATION DOSE REDUCTION: This exam was performed according to the departmental dose-optimization program which includes automated exposure control, adjustment of the mA and/or kV according to patient size and/or use of iterative reconstruction technique. CONTRAST:  75mL OMNIPAQUE IOHEXOL 350 MG/ML SOLN COMPARISON:  None FINDINGS: Cardiovascular: Satisfactory opacification of the pulmonary arteries to the segmental level. No evidence of pulmonary embolism. Normal heart size. No pericardial effusion. Mediastinum/Nodes: Thyroid gland, trachea and esophagus are unremarkable. Enlarged right hilar lymph node is identified measuring 1.7 cm, image 78/5. No mediastinal or axillary adenopathy. Lungs/Pleura: No pleural effusion identified. No pneumothorax. Tree-in-bud nodularity identified scattered throughout the central right upper lobe and central right lower lobe. Patchy airspace densities noted within the  medial right upper lobe. Diffuse bronchial wall thickening identified throughout the right lung. Nonspecific solid nodule within the posteromedial right lower lobe measures 5 mm, image 107/3. Upper Abdomen: No acute abnormality. Musculoskeletal: No chest wall abnormality. No acute or significant osseous findings. Review of the MIP images confirms the above findings. IMPRESSION: 1. No evidence for acute pulmonary embolism. 2. Tree-in-bud nodularity scattered throughout the central right upper lobe and central right lower lobe with patchy airspace densities within the medial right upper lobe. Findings are favored to represent an inflammatory or infectious process. 3. Enlarged right hilar lymph node is likely reactive. 4. Nonspecific solid nodule within the posteromedial right lower lobe measures 5 mm. Per Fleischner Society Guidelines, no routine follow-up imaging is recommended. These guidelines do not apply to immunocompromised patients and patients with cancer. Follow up in patients with significant comorbidities as clinically warranted. For lung cancer screening, adhere to Lung-RADS guidelines. Reference: Radiology. 2017; 284(1):228-43. Electronically Signed   By: Signa Kell M.D.   On: 08/22/2022 11:17   DG Chest 2 View  Result Date: 08/22/2022 CLINICAL DATA:  Provided history: Cough for 1 week. History of asthma. EXAM: CHEST - 2 VIEW COMPARISON:  Prior chest radiographs 04/15/2022 and earlier. FINDINGS: Heart size within normal limits. No appreciable airspace consolidation. No evidence of pleural effusion or pneumothorax. No acute osseous abnormality identified. IMPRESSION: No evidence of active cardiopulmonary disease. Electronically Signed   By: Jackey Loge D.O.   On: 08/22/2022 08:02    Procedures Procedures    Medications Ordered in ED Medications  sodium chloride 0.9 % bolus 500 mL (0 mLs Intravenous Stopped 08/22/22 0844)  acetaminophen (TYLENOL) tablet 1,000 mg (1,000 mg Oral Given 08/22/22  0805)  sodium chloride 0.9 % bolus 500 mL (0 mLs Intravenous Stopped 08/22/22 0947)  potassium chloride SA (KLOR-CON M) CR tablet 40 mEq (40 mEq Oral Given 08/22/22 0944)  potassium chloride 10 mEq in 100 mL IVPB (0 mEq Intravenous Stopped 08/22/22 1208)  magnesium sulfate IVPB 2 g 50 mL (0 g Intravenous Stopped 08/22/22 1105)  iohexol (OMNIPAQUE) 350 MG/ML injection 75 mL (75 mLs Intravenous Contrast Given 08/22/22 1045)    ED Course/ Medical Decision Making/ A&P Clinical  Course as of 08/22/22 1502  Wed Aug 22, 2022  4098 Re-evaluated and noted that he sits for prolonged periods of time at [employer]. Denies PMHx of PE/DVT, anticoagulant use, recent surgery. No chest pain, shortness of breath.  [SB]  0932 Calcium(!!): 6.4 [SB]  0932 D-Dimer, Quant(!): 0.63 [SB]  0932 Potassium(!): 2.9 [SB]  1056 Corrected calcium for hypoalbumin is 8.2 [SB]  1427 Pt re-evaluated and noted improvement of symptoms with treatment regimen in the ED. discussed with patient lab and imaging findings. [SB]  1437 Patient ambulated throughout the emergency department with oxygen saturation remaining at 96%.  Patient asymptomatic. [SB]  1446 Re-evaluated and noted improvement of symptoms with treatment regimen. Discussed discharge treatment plan. Pt agreeable at this time. Pt appears safe for discharge. [SB]    Clinical Course User Index [SB] Saraiah Bhat A, PA-C                             Medical Decision Making Amount and/or Complexity of Data Reviewed Labs: ordered. Decision-making details documented in ED Course. Radiology: ordered.  Risk OTC drugs. Prescription drug management.   Pt presents with productive cough onset 1 week. Possible sick contacts at [facility]. Vital signs, pt afebrile, tachycardic to 113. On exam, pt with Uvula midline without swelling. No posterior pharyngeal erythema or tonsillar exudate noted. Patent airway. Pt able to speak in clear complete sentences. Tolerating oral secretions. No  acute cardiovascular, respiratory, abdominal exam findings. Differential diagnosis includes viral URI with cough, viral pharyngitis, strep pharyngitis, PNA, ACS, PE, electrolyte abnormality.    Labs:  I ordered, and personally interpreted labs.  The pertinent results include:   Strep swab negative CBC with leukocytosis at 13.5 otherwise unremarkable BMP with hypokalemia at 2.9, hypocalcemia at 6.4 Dimer elevated slightly at 0.63 Hypomagnesia noted at 1.4 Hypophosphorus noted at 1.8 Hypoalbuminemia noted at 2.8 on hepatic function panel otherwise unremarkable.  Calcium corrected to 8.2 in the setting of hypoalbuminemia  I-STAT Chem-8 demonstrates ionized calcium at 1.12, slightly decreased.  Imaging: I ordered imaging studies including Chest x-ray I independently visualized and interpreted imaging which showed: no acute findings I agree with the radiologist interpretation  Medications:  I ordered medication including IV fluids, Tylenol, magnesium, potassium for symptom management Reevaluation of the patient after these medicines and interventions, I reevaluated the patient and found that they have improved I have reviewed the patients home medicines and have made adjustments as needed   Disposition: Presentation suspicious for hypomagnesium, hypokalemia, hypocalcemia in the setting of decreased nutrition, patient is currently homeless.  Also notable for pneumonia.  No concerns at this time for PE, ACS, strep pharyngitis, viral pharyngitis, viral URI with cough.  Patient ambulatory in the emergency department with oxygen remaining at 96% on room air and asymptomatic.  After consideration of the diagnostic results and the patients response to treatment, I feel that the patient would benefit from Discharge home.  Patient provided with a prescription for doxycycline, Augmentin, Tessalon Perles.  Also provided with a prescription for potassium and instructed to follow-up with his primary care  provider in 1 week for potassium recheck.  Supportive care measures and strict return precautions discussed with patient at bedside. Pt acknowledges and verbalizes understanding. Pt appears safe for discharge. Follow up as indicated in discharge paperwork.    This chart was dictated using voice recognition software, Dragon. Despite the best efforts of this provider to proofread and correct errors, errors may still  occur which can change documentation meaning.  Final Clinical Impression(s) / ED Diagnoses Final diagnoses:  Community acquired pneumonia, unspecified laterality  Hypomagnesemia  Hypokalemia  Hypocalcemia    Rx / DC Orders ED Discharge Orders          Ordered    potassium chloride SA (KLOR-CON M) 20 MEQ tablet  2 times daily        08/22/22 1449    doxycycline (VIBRAMYCIN) 100 MG capsule  2 times daily        08/22/22 1449    amoxicillin-clavulanate (AUGMENTIN) 875-125 MG tablet  Every 12 hours        08/22/22 1449    benzonatate (TESSALON) 100 MG capsule  Every 8 hours        08/22/22 1449              Carson Meche A, PA-C 08/22/22 1502    Margarita Grizzle, MD 08/23/22 1704

## 2022-08-24 LAB — CALCIUM, IONIZED: Calcium, Ionized, Serum: 4.4 mg/dL — ABNORMAL LOW (ref 4.5–5.6)

## 2022-10-26 ENCOUNTER — Emergency Department (HOSPITAL_COMMUNITY)
Admission: EM | Admit: 2022-10-26 | Discharge: 2022-10-26 | Attending: Emergency Medicine | Admitting: Emergency Medicine

## 2022-10-26 ENCOUNTER — Other Ambulatory Visit: Payer: Self-pay

## 2022-10-26 ENCOUNTER — Encounter (HOSPITAL_COMMUNITY): Payer: Self-pay | Admitting: Emergency Medicine

## 2022-10-26 DIAGNOSIS — S0181XA Laceration without foreign body of other part of head, initial encounter: Secondary | ICD-10-CM | POA: Diagnosis not present

## 2022-10-26 MED ORDER — LIDOCAINE-EPINEPHRINE-TETRACAINE (LET) TOPICAL GEL
3.0000 mL | Freq: Once | TOPICAL | Status: AC
  Administered 2022-10-26: 3 mL via TOPICAL
  Filled 2022-10-26: qty 3

## 2022-10-26 MED ORDER — IBUPROFEN 200 MG PO TABS
600.0000 mg | ORAL_TABLET | Freq: Once | ORAL | Status: AC
  Administered 2022-10-26: 600 mg via ORAL
  Filled 2022-10-26: qty 3

## 2022-10-26 MED ORDER — LIDOCAINE HCL 2 % IJ SOLN
5.0000 mL | Freq: Once | INTRAMUSCULAR | Status: AC
  Administered 2022-10-26: 100 mg via INTRADERMAL
  Filled 2022-10-26: qty 20

## 2022-10-26 MED ORDER — IBUPROFEN 600 MG PO TABS: 600.0000 mg | ORAL_TABLET | Freq: Four times a day (QID) | ORAL | 0 refills | Status: DC | PRN
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
## 2022-10-26 NOTE — Discharge Instructions (Addendum)
You were seen in the ED for a laceration. The laceration was repaired with 8 sutures. These should be taken out in 7-10 days depending on healing of the area. You can take Tylenol or Ibuprofen for pain as needed. If any other symptoms develop or there is evidence of infection, seek medical care.
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
## 2022-11-29 ENCOUNTER — Encounter (HOSPITAL_COMMUNITY): Payer: Self-pay | Admitting: Behavioral Health

## 2022-11-29 ENCOUNTER — Ambulatory Visit (HOSPITAL_COMMUNITY)
Admission: EM | Admit: 2022-11-29 | Discharge: 2022-11-29 | Disposition: A | Discharge status: Other Acute Inpatient Hospital | Payer: MEDICAID | Attending: Behavioral Health | Admitting: Behavioral Health

## 2022-11-29 ENCOUNTER — Other Ambulatory Visit: Payer: Self-pay

## 2022-11-29 ENCOUNTER — Inpatient Hospital Stay
Admission: AD | Admit: 2022-11-29 | Discharge: 2022-12-10 | DRG: 885 | Disposition: A | Discharge status: Home / Self Care | Payer: MEDICAID | Source: Intra-hospital | Attending: Psychiatry | Admitting: Psychiatry

## 2022-11-29 ENCOUNTER — Encounter: Payer: Self-pay | Admitting: Behavioral Health

## 2022-11-29 DIAGNOSIS — F321 Major depressive disorder, single episode, moderate: Secondary | ICD-10-CM | POA: Diagnosis present

## 2022-11-29 DIAGNOSIS — Z800 Family history of malignant neoplasm of digestive organs: Secondary | ICD-10-CM

## 2022-11-29 DIAGNOSIS — F17210 Nicotine dependence, cigarettes, uncomplicated: Secondary | ICD-10-CM | POA: Diagnosis present

## 2022-11-29 DIAGNOSIS — R682 Dry mouth, unspecified: Secondary | ICD-10-CM | POA: Diagnosis not present

## 2022-11-29 DIAGNOSIS — F200 Paranoid schizophrenia: Principal | ICD-10-CM | POA: Diagnosis present

## 2022-11-29 DIAGNOSIS — Z560 Unemployment, unspecified: Secondary | ICD-10-CM | POA: Insufficient documentation

## 2022-11-29 DIAGNOSIS — Z8673 Personal history of transient ischemic attack (TIA), and cerebral infarction without residual deficits: Secondary | ICD-10-CM | POA: Diagnosis not present

## 2022-11-29 DIAGNOSIS — J3489 Other specified disorders of nose and nasal sinuses: Secondary | ICD-10-CM | POA: Diagnosis present

## 2022-11-29 DIAGNOSIS — Z825 Family history of asthma and other chronic lower respiratory diseases: Secondary | ICD-10-CM | POA: Diagnosis not present

## 2022-11-29 DIAGNOSIS — F1590 Other stimulant use, unspecified, uncomplicated: Secondary | ICD-10-CM | POA: Diagnosis present

## 2022-11-29 DIAGNOSIS — Z5900 Homelessness unspecified: Secondary | ICD-10-CM | POA: Diagnosis not present

## 2022-11-29 DIAGNOSIS — J45909 Unspecified asthma, uncomplicated: Secondary | ICD-10-CM | POA: Diagnosis present

## 2022-11-29 DIAGNOSIS — T50915A Adverse effect of multiple unspecified drugs, medicaments and biological substances, initial encounter: Secondary | ICD-10-CM | POA: Diagnosis not present

## 2022-11-29 DIAGNOSIS — F1510 Other stimulant abuse, uncomplicated: Secondary | ICD-10-CM | POA: Diagnosis present

## 2022-11-29 DIAGNOSIS — Z833 Family history of diabetes mellitus: Secondary | ICD-10-CM | POA: Diagnosis not present

## 2022-11-29 DIAGNOSIS — G4700 Insomnia, unspecified: Secondary | ICD-10-CM | POA: Diagnosis present

## 2022-11-29 DIAGNOSIS — Z79899 Other long term (current) drug therapy: Secondary | ICD-10-CM | POA: Diagnosis not present

## 2022-11-29 DIAGNOSIS — Z8249 Family history of ischemic heart disease and other diseases of the circulatory system: Secondary | ICD-10-CM

## 2022-11-29 LAB — COMPREHENSIVE METABOLIC PANEL
ALT: 34 U/L (ref 0–44)
AST: 47 U/L — ABNORMAL HIGH (ref 15–41)
Albumin: 4.6 g/dL (ref 3.5–5.0)
Alkaline Phosphatase: 76 U/L (ref 38–126)
Anion gap: 9 (ref 5–15)
BUN: 13 mg/dL (ref 6–20)
CO2: 27 mmol/L (ref 22–32)
Calcium: 9.4 mg/dL (ref 8.9–10.3)
Chloride: 101 mmol/L (ref 98–111)
Creatinine, Ser: 0.89 mg/dL (ref 0.61–1.24)
GFR, Estimated: >60 mL/min (ref >60)
Glucose, Bld: 94 mg/dL (ref 70–99)
Potassium: 3.5 mmol/L (ref 3.5–5.1)
Sodium: 137 mmol/L (ref 135–145)
Total Bilirubin: 1 mg/dL (ref 0.3–1.2)
Total Protein: 8.6 g/dL — ABNORMAL HIGH (ref 6.5–8.1)

## 2022-11-29 LAB — CBC WITH DIFFERENTIAL/PLATELET
Abs Immature Granulocytes: 0.03 10*3/uL (ref 0.00–0.07)
Basophils Absolute: 0 10*3/uL (ref 0.0–0.1)
Basophils Relative: 0 %
Eosinophils Absolute: 0.2 10*3/uL (ref 0.0–0.5)
Eosinophils Relative: 3 %
HCT: 43.8 % (ref 39.0–52.0)
Hemoglobin: 14.4 g/dL (ref 13.0–17.0)
Immature Granulocytes: 1 %
Lymphocytes Relative: 28 %
Lymphs Abs: 1.8 10*3/uL (ref 0.7–4.0)
MCH: 32.7 pg (ref 26.0–34.0)
MCHC: 32.9 g/dL (ref 30.0–36.0)
MCV: 99.5 fL (ref 80.0–100.0)
Monocytes Absolute: 0.5 10*3/uL (ref 0.1–1.0)
Monocytes Relative: 8 %
Neutro Abs: 3.8 10*3/uL (ref 1.7–7.7)
Neutrophils Relative %: 60 %
Platelets: 310 10*3/uL (ref 150–400)
RBC: 4.4 MIL/uL (ref 4.22–5.81)
RDW: 11.8 % (ref 11.5–15.5)
WBC: 6.3 10*3/uL (ref 4.0–10.5)
nRBC: 0 % (ref 0.0–0.2)

## 2022-11-29 LAB — LIPID PANEL
Cholesterol: 201 mg/dL — ABNORMAL HIGH (ref 0–200)
HDL: 73 mg/dL (ref >40)
LDL Cholesterol: 114 mg/dL — ABNORMAL HIGH (ref 0–99)
Total CHOL/HDL Ratio: 2.8 RATIO
Triglycerides: 69 mg/dL (ref <150)
VLDL: 14 mg/dL (ref 0–40)

## 2022-11-29 LAB — TSH: TSH: 0.949 u[IU]/mL (ref 0.350–4.500)

## 2022-11-29 LAB — HEMOGLOBIN A1C
Hgb A1c MFr Bld: 4.6 % — ABNORMAL LOW (ref 4.8–5.6)
Mean Plasma Glucose: 85.32 mg/dL

## 2022-11-29 LAB — MAGNESIUM: Magnesium: 2.3 mg/dL (ref 1.7–2.4)

## 2022-11-29 LAB — ETHANOL: Alcohol, Ethyl (B): <10 mg/dL (ref <10)

## 2022-11-29 MED ORDER — LORAZEPAM 2 MG/ML IJ SOLN: 2.0000 mg | Freq: Three times a day (TID) | INTRAMUSCULAR | Status: DC | PRN

## 2022-11-29 MED ORDER — OLANZAPINE 5 MG PO TBDP
5.0000 mg | ORAL_TABLET | Freq: Every day | ORAL | Status: DC
  Filled 2022-11-29: qty 1

## 2022-11-29 MED ORDER — DIPHENHYDRAMINE HCL 50 MG/ML IJ SOLN: 50.0000 mg | Freq: Three times a day (TID) | INTRAMUSCULAR | Status: DC | PRN

## 2022-11-29 MED ORDER — MAGNESIUM HYDROXIDE 400 MG/5ML PO SUSP: 30.0000 mL | Freq: Every day | ORAL | Status: DC | PRN

## 2022-11-29 MED ORDER — ACETAMINOPHEN 325 MG PO TABS
650.0000 mg | ORAL_TABLET | Freq: Four times a day (QID) | ORAL | Status: DC | PRN
  Filled 2022-11-29: qty 2

## 2022-11-29 MED ORDER — ALUM & MAG HYDROXIDE-SIMETH 200-200-20 MG/5ML PO SUSP: 30.0000 mL | ORAL | Status: DC | PRN

## 2022-11-29 MED ORDER — HALOPERIDOL LACTATE 5 MG/ML IJ SOLN: 5.0000 mg | Freq: Three times a day (TID) | INTRAMUSCULAR | Status: DC | PRN

## 2022-11-29 MED ORDER — TRAZODONE HCL 50 MG PO TABS: 50.0000 mg | ORAL_TABLET | Freq: Every evening | ORAL | Status: DC | PRN

## 2022-11-29 MED ORDER — HYDROXYZINE HCL 25 MG PO TABS: 25.0000 mg | ORAL_TABLET | Freq: Three times a day (TID) | ORAL | Status: DC | PRN

## 2022-11-29 MED ORDER — TRAZODONE HCL 50 MG PO TABS
50.0000 mg | ORAL_TABLET | Freq: Every evening | ORAL | Status: DC | PRN
  Administered 2022-12-01 – 2022-12-09 (×8): 50 mg via ORAL
  Filled 2022-11-29 (×8): qty 1

## 2022-11-29 MED ORDER — LORAZEPAM 2 MG PO TABS: 2.0000 mg | ORAL_TABLET | Freq: Three times a day (TID) | ORAL | Status: DC | PRN

## 2022-11-29 MED ORDER — HALOPERIDOL 5 MG PO TABS: 5.0000 mg | ORAL_TABLET | Freq: Three times a day (TID) | ORAL | Status: DC | PRN

## 2022-11-29 MED ORDER — DIPHENHYDRAMINE HCL 25 MG PO CAPS: 50.0000 mg | ORAL_CAPSULE | Freq: Three times a day (TID) | ORAL | Status: DC | PRN

## 2022-11-29 MED ORDER — ACETAMINOPHEN 325 MG PO TABS: 650.0000 mg | ORAL_TABLET | Freq: Four times a day (QID) | ORAL | Status: DC | PRN

## 2022-11-29 MED ORDER — HYDROXYZINE HCL 25 MG PO TABS
25.0000 mg | ORAL_TABLET | Freq: Three times a day (TID) | ORAL | Status: DC | PRN
  Administered 2022-11-30 – 2022-12-03 (×4): 25 mg via ORAL
  Filled 2022-11-29 (×4): qty 1

## 2022-11-29 NOTE — Group Note (Signed)
Date:  11/29/2022 Time:  9:09 PM  Group Topic/Focus:  Wrap-Up Group:   The focus of this group is to help patients review their daily goal of treatment and discuss progress on daily workbooks.    Participation Level:  Active  Participation Quality:  Appropriate  Affect:  Appropriate  Cognitive:  Alert and Appropriate  Insight: Appropriate and Good  Engagement in Group:  Developing/Improving  Modes of Intervention:  Clarification, Education, Rapport Building, and Socialization  Additional Comments:     Shane Small 11/29/2022, 9:09 PM

## 2022-11-29 NOTE — Progress Notes (Signed)
   11/29/22 1115  [facility] Triage Screening (Walk-ins at [facility] only)  How Did You Hear About [place]? Legal System  What Is the Reason for Your Visit/Call Today? Pt reports to [facility] per [facility]. Pt states, "I would like to speak to a therapist, that is why I am here." Pt reports concerns with worsening anxiety and depression for the past 7 years. Pt also states, "I have been on antipsychoic meds, but have been off for 2-3 years." Pt reports to have been diagnosed with Schizophrena. Pt reports a history of tobacco and alcohol for roughly 14 years. Pt reports his last use of tobacco was last night. Pt denies SI and HI.  How Long Has This Been Causing You Problems? > than 6 months  Have You Recently Had Any Thoughts About Hurting Yourself? No  Are You Planning to Commit Suicide/Harm Yourself At This time? No  Have you Recently Had Thoughts About Hurting Someone Karolee Ohs? No  Are You Planning To Harm Someone At This Time? No  Are you currently experiencing any auditory, visual or other hallucinations? Yes  Please explain the hallucinations you are currently experiencing: pt states "they tell me to do things"  Have You Used Any Alcohol or Drugs in the Past 24 Hours? Yes  How long ago did you use Drugs or Alcohol? tobacco  What Did You Use and How Much? n/z  Do you have any current medical co-morbidities that require immediate attention? Yes  Please describe current medical co-morbidities that require immediate attention: pt reports having std  Clinician description of patient physical appearance/behavior: poorly groomed, calm  What Do You Feel Would Help You the Most Today? Stress Management;Treatment for Depression or other mood problem  If access to [facility] Urgent Care was not available, would you have sought care in the Emergency Department? No  Determination of Need Urgent (48 hours)  Options For Referral Medication Management;Outpatient Therapy
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
## 2022-11-29 NOTE — Plan of Care (Signed)
  Problem: Education: Goal: Emotional status will improve Outcome: Progressing Goal: Mental status will improve Outcome: Progressing   Problem: Coping: Goal: Ability to verbalize frustrations and anger appropriately will improve Outcome: Progressing   Problem: Safety: Goal: Periods of time without injury will increase Outcome: Progressing

## 2022-11-29 NOTE — ED Provider Notes (Signed)
[facility] Continuous Assessment Admission H&P  Date: 11/29/22 Patient Name: Shane Small MRN: [id number] Chief Complaint: "I'm having an issue with a former student at [employer]"  Diagnoses:  Final diagnoses:  Schizophrenia, paranoid (HCC)   HPI: Shane Small is a 37 y.o. male patient with a past psychiatric history of paranoid schizophrenia, substance-induced psychotic disorder, polysubstance abuse, GAD, cannabis use disorder, schizoaffective disorder, bipolar type, delusions, homicidal ideations, and methamphetamine use who presented to [facility] voluntarily via [employer] [facility] initially stating that he is here to speak to a therapist for concerns of worsening anxiety and depression.   Patient assessed face-to-face by this provider, consulted with Dr. Lucianne Muss, and chart reviewed on 11/29/22. Sydell Axon, [facility] present during assessment. On evaluation, Shane Small is seated in assessment area in no acute distress. Patient is alert and oriented x4, calm, cooperative, and pleasant. Speech is clear and coherent, normal rate and decreased volume. Eye contact is fair. Mood is euthymic with flat affect. Thought process is disorganized with tangential thought content that consists of paranoid ideations, delusions, and ideas of reference. Patient denies suicidal and homicidal ideations and is able to verbally contract for safety. Patient denies a history of suicide attempts or self-harm. Patient reports a past psychiatric hospitalization at [facility] although he is unsure when this was. Per chart review, patient was admitted to [facility] in May 2018 and has also presented to [facility], [facility], and [facility] multiple times from 2018-2023 for mental health concerns. Patient denies current auditory and visual hallucinations, however reports every day he hears a woman's voice that "tells me go there, don't do that, pushing me around to do stuff." Patient reports "always" experiencing symptoms of paranoia of people  following him "because I'm right about what is going on." Patient reports he believes people are plotting against him and believes companies, like [employer], are conspiring against him. Patient reports he was brought in by [facility] campus police after he was found on campus today. Patient states he is not a Dietitian and was on campus because he was having issues with the things that a former Consulting civil engineer (his child's mother) studied and a project that he believes is currently being used against him in a harmful way. Patient reports "people are leaving stuff in certain locations and getting mad at me about my EBT card." Patient appears preoccupied with ideas of reference about public busses and construction markers on the ground indicating things to him. Patient reports feeling "pressure in my head" when he sits on a certain side of the bus, eats certain things, or walks on a certain side of the street. Objectively, there is evidence of psychosis and delusional thinking. Patient does not appear to be responding to internal or external stimuli at this time.  Patient reports poor sleep (3-4 hours/night) and fair appetite. Patient reports he is currently homeless in [place] and unemployed. Patient states he has no family support but did speak with his mother several days ago. Patient denies access to weapons/firearms. Patient reports a history of alcohol use, however states he only drinks 1x/week or every other week now. Patient unable to identify the amount of alcohol he consumes on those days and states "it depends, beer or liquor." Patient denies use of illicit substances. Patient denies having current outpatient psychiatric services in place for therapy or medication management and states he used to go to [facility]. Patient reports he has not taken any psychotropic medication in 2-3 years. Patient reports he used to  take Abilify but doesn't feel it was helpful and believes he copes better and is able to keep  employment by not being on the medication. Patient states he is open to trying medication to help with anxiety but does not want to take any antipsychotic medication. Per chart review, patient's medication history includes: oral Abilify (2018, 2021, 2023), Abilify Maintena (2021), Cogentin (2018, 2023), Buspar (2021), Zyprexa (2018, 2023), trazodone (2018, 2023), and Ingrezza (2021).   Patient offered support and encouragement. Discussed with patient recommendations for inpatient psychiatric treatment. Discussed with patient admission to the continuous observation while awaiting inpatient psychiatric placement. Discussed with patient starting Zyprexa 5mg  daily. Patient is in agreement with inpatient psychiatric treatment but continues to state he does not want to take any antipsychotic medication.  Total Time spent with patient: 1 hour  Musculoskeletal  Strength & Muscle Tone: within normal limits Gait & Station: normal Patient leans: N/A  Psychiatric Specialty Exam  Presentation General Appearance:  Appropriate for Environment  Eye Contact: Fair  Speech: Clear and Coherent; Normal Rate  Speech Volume: Decreased  Handedness: Right   Mood and Affect  Mood: Euthymic  Affect: Flat   Thought Process  Thought Processes: Disorganized  Descriptions of Associations:Tangential  Orientation:Full (Time, Place and Person)  Thought Content:Paranoid Ideation; Tangential; Illogical; Delusions (Ideas of reference)  Diagnosis of Schizophrenia or Schizoaffective disorder in past: Yes  Duration of Psychotic Symptoms: Greater than six months  Hallucinations:Hallucinations: Auditory Description of Auditory Hallucinations: Patient reports hearing a woman's voice daily that "tells me go there, don't do that, pushing me around to do stuff"  Ideas of Reference:Paranoia; Delusions  Suicidal Thoughts:Suicidal Thoughts: No  Homicidal Thoughts:Homicidal Thoughts: No   Sensorium   Memory: Immediate Fair; Recent Fair; Remote Fair  Judgment: Poor  Insight: Lacking   Executive Functions  Concentration: Fair  Attention Span: Fair  Recall: Fair  Fund of Knowledge: Fair  Language: Good   Psychomotor Activity  Psychomotor Activity: Psychomotor Activity: Normal   Assets  Assets: Communication Skills; Desire for Improvement; Financial Resources/Insurance; Physical Health; Resilience   Sleep  Sleep: Poor Number of Hours of Sleep: 3-4   Nutritional Assessment (For OBS and FBC admissions only) Has the patient had a weight loss or gain of 10 pounds or more in the last 3 months?: No Has the patient had a decrease in food intake/or appetite?: No Does the patient have dental problems?: No Does the patient have eating habits or behaviors that may be indicators of an eating disorder including binging or inducing vomiting?: No Has the patient recently lost weight without trying?: 0 Has the patient been eating poorly because of a decreased appetite?: 0 Malnutrition Screening Tool Score: 0    Physical Exam Vitals and nursing note reviewed.  Constitutional:      General: He is not in acute distress.    Appearance: Normal appearance. He is not ill-appearing.  HENT:     Head: Normocephalic and atraumatic.     Nose: Nose normal.  Eyes:     General:        Right eye: No discharge.        Left eye: No discharge.     Conjunctiva/sclera: Conjunctivae normal.  Cardiovascular:     Rate and Rhythm: Normal rate.  Pulmonary:     Effort: Pulmonary effort is normal. No respiratory distress.  Musculoskeletal:        General: Normal range of motion.     Cervical back: Normal range of motion.  Skin:  General: Skin is warm and dry.  Neurological:     General: No focal deficit present.     Mental Status: He is alert and oriented to person, place, and time. Mental status is at baseline.  Psychiatric:        Attention and Perception: Attention normal.  He perceives auditory hallucinations.        Mood and Affect: Mood normal. Affect is flat.        Speech: Speech normal.        Behavior: Behavior normal. Behavior is cooperative.        Thought Content: Thought content is paranoid and delusional (Ideas of reference). Thought content does not include homicidal or suicidal ideation. Thought content does not include homicidal or suicidal plan.        Cognition and Memory: Cognition and memory normal.     Comments: Judgment: Poor    Review of Systems  Constitutional: Negative.   HENT: Negative.    Eyes: Negative.   Respiratory: Negative.    Cardiovascular: Negative.   Gastrointestinal: Negative.   Genitourinary: Negative.   Musculoskeletal: Negative.   Skin: Negative.   Neurological: Negative.   Endo/Heme/Allergies: Negative.   Psychiatric/Behavioral:  Positive for depression and hallucinations. Negative for memory loss, substance abuse and suicidal ideas. The patient has insomnia. The patient is not nervous/anxious.        Paranoia, delusions, ideas of reference    Blood pressure (!) 140/88, pulse 94, temperature 98.9 F (37.2 C), temperature source Oral, resp. rate 20, SpO2 100%. There is no height or weight on file to calculate BMI.  Past Psychiatric History: Paranoid schizophrenia, substance-induced psychotic disorder, polysubstance abuse, GAD, cannabis use disorder, schizoaffective disorder, bipolar type, delusions, homicidal ideations, methamphetamine use  Is the patient at risk to self? Yes  Has the patient been a risk to self in the past 6 months? Yes .    Has the patient been a risk to self within the distant past? Yes   Is the patient a risk to others? Yes   Has the patient been a risk to others in the past 6 months? Yes   Has the patient been a risk to others within the distant past? Yes   Past Medical History:  Past Medical History:  Diagnosis Date   Anxiety    Asthma    Daily headache    Depression    Paranoid  schizophrenia (HCC)    Schizophrenia (HCC)    Stroke (HCC)    Family History:  Family History  Problem Relation Age of Onset   Asthma Mother    Diabetes Father    Hypertension Father    Colon cancer Father 41   Social History:  Social History   Tobacco Use   Smoking status: Every Day    Types: Cigarettes   Smokeless tobacco: Never  Vaping Use   Vaping status: Former  Substance Use Topics   Alcohol use: Yes    Alcohol/week: 56.0 standard drinks of alcohol    Types: 28 Cans of beer, 28 Shots of liquor per week    Comment: occasionally   Drug use: Yes    Comment: CBD   Last Labs:  Admission on 11/29/2022  Component Date Value Ref Range Status   WBC 11/29/2022 6.3  4.0 - 10.5 K/uL Final   RBC 11/29/2022 4.40  4.22 - 5.81 MIL/uL Final   Hemoglobin 11/29/2022 14.4  13.0 - 17.0 g/dL Final   HCT 57/84/6962 43.8  39.0 - 52.0 %  Final   MCV 11/29/2022 99.5  80.0 - 100.0 fL Final   MCH 11/29/2022 32.7  26.0 - 34.0 pg Final   MCHC 11/29/2022 32.9  30.0 - 36.0 g/dL Final   RDW 29/56/2130 11.8  11.5 - 15.5 % Final   Platelets 11/29/2022 310  150 - 400 K/uL Final   nRBC 11/29/2022 0.0  0.0 - 0.2 % Final   Neutrophils Relative % 11/29/2022 60  % Final   Neutro Abs 11/29/2022 3.8  1.7 - 7.7 K/uL Final   Lymphocytes Relative 11/29/2022 28  % Final   Lymphs Abs 11/29/2022 1.8  0.7 - 4.0 K/uL Final   Monocytes Relative 11/29/2022 8  % Final   Monocytes Absolute 11/29/2022 0.5  0.1 - 1.0 K/uL Final   Eosinophils Relative 11/29/2022 3  % Final   Eosinophils Absolute 11/29/2022 0.2  0.0 - 0.5 K/uL Final   Basophils Relative 11/29/2022 0  % Final   Basophils Absolute 11/29/2022 0.0  0.0 - 0.1 K/uL Final   Immature Granulocytes 11/29/2022 1  % Final   Abs Immature Granulocytes 11/29/2022 0.03  0.00 - 0.07 K/uL Final   Performed at [facility], [address], [place], [place] [address]   Sodium 11/29/2022 137  135 - 145 mmol/L Final   Potassium 11/29/2022 3.5  3.5 - 5.1 mmol/L  Final   Chloride 11/29/2022 101  98 - 111 mmol/L Final   CO2 11/29/2022 27  22 - 32 mmol/L Final   Glucose, Bld 11/29/2022 94  70 - 99 mg/dL Final   Glucose reference range applies only to samples taken after fasting for at least 8 hours.   BUN 11/29/2022 13  6 - 20 mg/dL Final   Creatinine, Ser 11/29/2022 0.89  0.61 - 1.24 mg/dL Final   Calcium 46/96/2952 9.4  8.9 - 10.3 mg/dL Final   Total Protein 84/13/2440 8.6 (H)  6.5 - 8.1 g/dL Final   Albumin 02/24/2535 4.6  3.5 - 5.0 g/dL Final   AST 64/40/3474 47 (H)  15 - 41 U/L Final   ALT 11/29/2022 34  0 - 44 U/L Final   Alkaline Phosphatase 11/29/2022 76  38 - 126 U/L Final   Total Bilirubin 11/29/2022 1.0  0.3 - 1.2 mg/dL Final   GFR, Estimated 11/29/2022 >60  >60 mL/min Final   Comment: (NOTE) Calculated using the CKD-EPI Creatinine Equation (2021)    Anion gap 11/29/2022 9  5 - 15 Final   Performed at [facility], [address] [place], [place] [address]   Magnesium 11/29/2022 2.3  1.7 - 2.4 mg/dL Final   Performed at [facility], [address], [place], [place] [address]  Admission on 08/22/2022, Discharged on 08/22/2022  Component Date Value Ref Range Status   Group A Strep by PCR 08/22/2022 NOT DETECTED  NOT DETECTED Final   Performed at [facility], [address], [place], [place] [address]   D-Dimer, Quant 08/22/2022 0.63 (H)  0.00 - 0.50 ug/mL-FEU Final   Comment: (NOTE) At the manufacturer cut-off value of 0.5 g/mL FEU, this assay has a negative predictive value of 95-100%.This assay is intended for use in conjunction with a clinical pretest probability (PTP) assessment model to exclude pulmonary embolism (PE) and deep venous thrombosis (DVT) in outpatients suspected of PE or DVT. Results should be correlated with clinical presentation. Performed at [facility], [address] [place], [place] [address]    WBC 08/22/2022 13.5 (H)  4.0 - 10.5 K/uL Final   RBC  08/22/2022 3.92 (L)  4.22 - 5.81 MIL/uL Final   Hemoglobin 08/22/2022 12.6 (L)  13.0 - 17.0 g/dL Final   HCT 27/25/3664 38.5 (L)  39.0 - 52.0 % Final   MCV 08/22/2022 98.2  80.0 - 100.0 fL Final   MCH 08/22/2022 32.1  26.0 - 34.0 pg Final   MCHC 08/22/2022 32.7  30.0 - 36.0 g/dL Final   RDW 40/34/7425 11.7  11.5 - 15.5 % Final   Platelets 08/22/2022 330  150 - 400 K/uL Final   nRBC 08/22/2022 0.0  0.0 - 0.2 % Final   Neutrophils Relative % 08/22/2022 83  % Final   Neutro Abs 08/22/2022 11.1 (H)  1.7 - 7.7 K/uL Final   Lymphocytes Relative 08/22/2022 7  % Final   Lymphs Abs 08/22/2022 1.0  0.7 - 4.0 K/uL Final   Monocytes Relative 08/22/2022 9  % Final   Monocytes Absolute 08/22/2022 1.2 (H)  0.1 - 1.0 K/uL Final   Eosinophils Relative 08/22/2022 0  % Final   Eosinophils Absolute 08/22/2022 0.1  0.0 - 0.5 K/uL Final   Basophils Relative 08/22/2022 0  % Final   Basophils Absolute 08/22/2022 0.0  0.0 - 0.1 K/uL Final   Immature Granulocytes 08/22/2022 1  % Final   Abs Immature Granulocytes 08/22/2022 0.10 (H)  0.00 - 0.07 K/uL Final   Performed at [facility], [address], [place], [place] [address]   Sodium 08/22/2022 134 (L)  135 - 145 mmol/L Final   Potassium 08/22/2022 2.9 (L)  3.5 - 5.1 mmol/L Final   Chloride 08/22/2022 107  98 - 111 mmol/L Final   CO2 08/22/2022 19 (L)  22 - 32 mmol/L Final   Glucose, Bld 08/22/2022 91  70 - 99 mg/dL Final   Glucose reference range applies only to samples taken after fasting for at least 8 hours.   BUN 08/22/2022 13  6 - 20 mg/dL Final   Creatinine, Ser 08/22/2022 0.70  0.61 - 1.24 mg/dL Final   Calcium 75/64/3329 6.4 (LL)  8.9 - 10.3 mg/dL Final   Comment: CRITICAL RESULT CALLED TO, READ BACK BY AND VERIFIED WITH GRANT, C RN @ 0930 08/22/22 MULLINS, T    GFR, Estimated 08/22/2022 >60  >60 mL/min Final   Comment: (NOTE) Calculated using the CKD-EPI Creatinine Equation (2021)    Anion gap 08/22/2022 8  5 - 15 Final    Performed at [facility], [address] [place], [place] [address]   Magnesium 08/22/2022 1.4 (L)  1.7 - 2.4 mg/dL Final   Performed at [facility], [address], [place], [place] [address]   Phosphorus 08/22/2022 1.8 (L)  2.5 - 4.6 mg/dL Final   Performed at [facility], [address], [place], [place] [address]   Total Protein 08/22/2022 5.9 (L)  6.5 - 8.1 g/dL Final   Albumin 10/93/2355 2.8 (L)  3.5 - 5.0 g/dL Final   AST 73/22/0254 15  15 - 41 U/L Final   ALT 08/22/2022 13  0 - 44 U/L Final   Alkaline Phosphatase 08/22/2022 44  38 - 126 U/L Final   Total Bilirubin 08/22/2022 1.1  0.3 - 1.2 mg/dL Final   Bilirubin, Direct 08/22/2022 0.2  0.0 - 0.2 mg/dL Final   Indirect Bilirubin 08/22/2022 0.9  0.3 - 0.9 mg/dL Final   Performed at [facility], [address] [place], [place] [address]  Calcium, Ionized, Serum 08/22/2022 4.4 (L)  4.5 - 5.6 mg/dL Final   Comment: (NOTE) Performed At: [facility] [address] [place], [place] [id number] Jolene Schimke MD MV:[id number]    Sodium 08/22/2022 137  135 - 145 mmol/L Final   Potassium 08/22/2022 4.3  3.5 - 5.1 mmol/L Final   Chloride 08/22/2022 104  98 - 111 mmol/L Final   BUN 08/22/2022 13  6 - 20 mg/dL Final   Creatinine, Ser 08/22/2022 0.70  0.61 - 1.24 mg/dL Final   Glucose, Bld 84/13/2440 106 (H)  70 - 99 mg/dL Final   Glucose reference range applies only to samples taken after fasting for at least 8 hours.   Calcium, Ion 08/22/2022 1.12 (L)  1.15 - 1.40 mmol/L Final   TCO2 08/22/2022 24  22 - 32 mmol/L Final   Hemoglobin 08/22/2022 12.2 (L)  13.0 - 17.0 g/dL Final   HCT 02/24/2535 36.0 (L)  39.0 - 52.0 % Final    Allergies: Patient has no known allergies.  Medications:  Facility Ordered Medications  Medication   acetaminophen (TYLENOL) tablet 650 mg   alum & mag hydroxide-simeth (MAALOX/MYLANTA) 200-200-20 MG/5ML suspension 30 mL    magnesium hydroxide (MILK OF MAGNESIA) suspension 30 mL   hydrOXYzine (ATARAX) tablet 25 mg   traZODone (DESYREL) tablet 50 mg   OLANZapine zydis (ZYPREXA) disintegrating tablet 5 mg      Medical Decision Making  Shane Small was admitted to [facility] continuous assessment unit for Schizophrenia, paranoid [facility]), crisis management, and stabilization. Patient will be placed under IVC. Affidavit and petition for IVC and first exam completed.  Routine labs ordered, which include Lab Orders         CBC with Differential/Platelet         Comprehensive metabolic panel         Hemoglobin A1c         Magnesium         Ethanol         Lipid panel         TSH         Prolactin         POCT Urine Drug Screen - (I-Screen)    -EKG Medication Management: Medications started Meds ordered this encounter  Medications   acetaminophen (TYLENOL) tablet 650 mg   alum & mag hydroxide-simeth (MAALOX/MYLANTA) 200-200-20 MG/5ML suspension 30 mL   magnesium hydroxide (MILK OF MAGNESIA) suspension 30 mL   hydrOXYzine (ATARAX) tablet 25 mg   traZODone (DESYREL) tablet 50 mg   OLANZapine zydis (ZYPREXA) disintegrating tablet 5 mg    Will maintain observation checks every 15 minutes for safety. Psychosocial education regarding relapse prevention and self-care; social and communication  Social work will consult with family for collateral information and discuss discharge and follow up plan.   Recommendations  Based on my evaluation the patient does not appear to have an emergency medical condition.  -Patient will be placed under IVC, affidavit and petition for IVC and first exam completed -Start Zyprexa 5mg  daily -Recommend inpatient psychiatric treatment. Secure message sent to Rona Ravens, RN/[facility] to review for [facility]. Patient has been accepted to [facility] BMU room 307 pending lab work and faxed IVC. Accepting provider is Dr. Marlou Porch.   Sunday Corn,  NP 11/29/22  3:31 PM

## 2022-11-29 NOTE — Progress Notes (Signed)
   11/29/22 1115  [facility] Triage Screening (Walk-ins at [facility] only)  How Did You Hear About [place]? Legal System  What Is the Reason for Your Visit/Call Today? Pt reports to [facility] per [facility]. Pt reports concerns with worsening anxiety and depression for the past 7 years. Pt also states, "I have been on antipsychoic meds, but have been off for 2-3 years." Pt reports to have been diagnosed with Schizophrena. Pt reports a history of tobacco and alcohol for roughly 14 years. Pt reports his last use of tobacco was last night. Pt denies SI and HI.  How Long Has This Been Causing You Problems? > than 6 months  Have You Recently Had Any Thoughts About Hurting Yourself? No  Are You Planning to Commit Suicide/Harm Yourself At This time? No  Have you Recently Had Thoughts About Hurting Someone Karolee Ohs? No  Are You Planning To Harm Someone At This Time? No  Are you currently experiencing any auditory, visual or other hallucinations? Yes  Please explain the hallucinations you are currently experiencing: pt states "they tell me to do things"  Have You Used Any Alcohol or Drugs in the Past 24 Hours? Yes  How long ago did you use Drugs or Alcohol? tobacco  What Did You Use and How Much? n/z  Do you have any current medical co-morbidities that require immediate attention? Yes  Please describe current medical co-morbidities that require immediate attention: pt reports having std  Clinician description of patient physical appearance/behavior: poorly groomed, calm  What Do You Feel Would Help You the Most Today? Stress Management;Treatment for Depression or other mood problem  If access to [facility] was not available, would you have sought care in the Emergency Department? No  Determination of Need Urgent (48 hours)  Options For Referral Medication Management;Outpatient Therapy

## 2022-11-29 NOTE — ED Notes (Signed)
Nurse to nurse report given to Laurier Nancy, RN. Sheriff called to transport pt to BMU.

## 2022-11-29 NOTE — ED Provider Notes (Signed)
FBC/OBS ASAP Discharge Summary  Date and Time: 11/29/2022 6:20 PM  Name: Shane Small  MRN:  [id number]   Discharge Diagnoses:  Final diagnoses:  Schizophrenia, paranoid (HCC)   Subjective: "I'm having an issue with a former student at [employer]  Stay Summary: Per [facility] Admission H&P 11/29/22 1339: "HPI: Shane Small is a 37 y.o. male patient with a past psychiatric history of paranoid schizophrenia, substance-induced psychotic disorder, polysubstance abuse, GAD, cannabis use disorder, schizoaffective disorder, bipolar type, delusions, homicidal ideations, and methamphetamine use who presented to [facility] voluntarily via [employer] [facility] initially stating that he is here to speak to a therapist for concerns of worsening anxiety and depression.    Patient assessed face-to-face by this provider, consulted with Dr. Lucianne Muss, and chart reviewed on 11/29/22. Sydell Axon, [facility] present during assessment. On evaluation, Shane Small is seated in assessment area in no acute distress. Patient is alert and oriented x4, calm, cooperative, and pleasant. Speech is clear and coherent, normal rate and decreased volume. Eye contact is fair. Mood is euthymic with flat affect. Thought process is disorganized with tangential thought content that consists of paranoid ideations, delusions, and ideas of reference. Patient denies suicidal and homicidal ideations and is able to verbally contract for safety. Patient denies a history of suicide attempts or self-harm. Patient reports a past psychiatric hospitalization at [facility] although he is unsure when this was. Per chart review, patient was admitted to [facility] in May 2018 and has also presented to [facility], and [facility] multiple times from 2018-2023 for mental health concerns. Patient denies current auditory and visual hallucinations, however reports every day he hears a woman's voice that "tells me go there, don't do that, pushing me around to do stuff." Patient reports "always"  experiencing symptoms of paranoia of people following him "because I'm right about what is going on." Patient reports he believes people are plotting against him and believes companies, like [employer], are conspiring against him. Patient reports he was brought in by [facility] police after he was found on campus today. Patient states he is not a Dietitian and was on campus because he was having issues with the things that a former Consulting civil engineer (his child's mother) studied and a project that he believes is currently being used against him in a harmful way. Patient reports "people are leaving stuff in certain locations and getting mad at me about my EBT card." Patient appears preoccupied with ideas of reference about public busses and construction markers on the ground indicating things to him. Patient reports feeling "pressure in my head" when he sits on a certain side of the bus, eats certain things, or walks on a certain side of the street. Objectively, there is evidence of psychosis and delusional thinking. Patient does not appear to be responding to internal or external stimuli at this time.   Patient reports poor sleep (3-4 hours/night) and fair appetite. Patient reports he is currently homeless in [place] and unemployed. Patient states he has no family support but did speak with his mother several days ago. Patient denies access to weapons/firearms. Patient reports a history of alcohol use, however states he only drinks 1x/week or every other week now. Patient unable to identify the amount of alcohol he consumes on those days and states "it depends, beer or liquor." Patient denies use of illicit substances. Patient denies having current outpatient psychiatric services in place for therapy or medication management and states he used to go to [facility]. Patient reports  he has not taken any psychotropic medication in 2-3 years. Patient reports he used to take Abilify but doesn't feel it was helpful and believes  he copes better and is able to keep employment by not being on the medication. Patient states he is open to trying medication to help with anxiety but does not want to take any antipsychotic medication. Per chart review, patient's medication history includes: oral Abilify (2018, 2021, 2023), Abilify Maintena (2021), Cogentin (2018, 2023), Buspar (2021), Zyprexa (2018, 2023), trazodone (2018, 2023), and Ingrezza (2021).    Patient offered support and encouragement. Discussed with patient recommendations for inpatient psychiatric treatment. Discussed with patient admission to the continuous observation while awaiting inpatient psychiatric placement. Discussed with patient starting Zyprexa 5mg  daily. Patient is in agreement with inpatient psychiatric treatment but continues to state he does not want to take any antipsychotic medication."  Total Time spent with patient: 15 minutes  Past Psychiatric History: Paranoid schizophrenia, substance-induced psychotic disorder, polysubstance abuse, GAD, cannabis use disorder, schizoaffective disorder, bipolar type, delusions, homicidal ideations, methamphetamine use  Past Medical History:  Past Medical History:  Diagnosis Date   Anxiety    Asthma    Daily headache    Depression    Paranoid schizophrenia (HCC)    Schizophrenia (HCC)    Stroke (HCC)    Family History:  Family History  Problem Relation Age of Onset   Asthma Mother    Diabetes Father    Hypertension Father    Colon cancer Father 91   Family Psychiatric History: None reported  Social History:  Social History   Tobacco Use   Smoking status: Every Day    Types: Cigarettes   Smokeless tobacco: Never  Vaping Use   Vaping status: Former  Substance Use Topics   Alcohol use: Yes    Alcohol/week: 56.0 standard drinks of alcohol    Types: 28 Cans of beer, 28 Shots of liquor per week    Comment: occasionally   Drug use: Yes    Comment: CBD   Tobacco Cessation:  Prescription not  provided because: Patient transferring to [facility]  for inpatient psychiatric treatment  Current Medications:  Current Facility-Administered Medications  Medication Dose Route Frequency Provider Last Rate Last Admin   acetaminophen (TYLENOL) tablet 650 mg  650 mg Oral Q6H PRN Sunday Corn, NP       alum & mag hydroxide-simeth (MAALOX/MYLANTA) 200-200-20 MG/5ML suspension 30 mL  30 mL Oral Q4H PRN Sunday Corn, NP       hydrOXYzine (ATARAX) tablet 25 mg  25 mg Oral TID PRN Sunday Corn, NP       magnesium hydroxide (MILK OF MAGNESIA) suspension 30 mL  30 mL Oral Daily PRN Sunday Corn, NP       OLANZapine zydis (ZYPREXA) disintegrating tablet 5 mg  5 mg Oral Daily Sunday Corn, NP       traZODone (DESYREL) tablet 50 mg  50 mg Oral QHS PRN Sunday Corn, NP       Current Outpatient Medications  Medication Sig Dispense Refill   diphenhydrAMINE (BENADRYL) 25 mg capsule Take 25 mg by mouth every 8 (eight) hours as needed for allergies or sleep.     Multiple Vitamin (MULTIVITAMIN WITH MINERALS) TABS tablet Take 1 tablet by mouth daily.      PTA Medications:  Facility Ordered Medications  Medication   acetaminophen (TYLENOL) tablet 650 mg   alum & mag hydroxide-simeth (MAALOX/MYLANTA) 200-200-20 MG/5ML suspension 30 mL  magnesium hydroxide (MILK OF MAGNESIA) suspension 30 mL   hydrOXYzine (ATARAX) tablet 25 mg   traZODone (DESYREL) tablet 50 mg   OLANZapine zydis (ZYPREXA) disintegrating tablet 5 mg       01/07/2022   10:48 AM 04/08/2020   10:14 AM  Depression screen PHQ 2/9  Decreased Interest 0 2  Down, Depressed, Hopeless 1 1  PHQ - 2 Score 1 3  Altered sleeping 1 1  Tired, decreased energy 0 2  Change in appetite 0 2  Feeling bad or failure about yourself  1 1  Trouble concentrating 1 3  Moving slowly or fidgety/restless 0 1  Suicidal thoughts 0 0  PHQ-9 Score 4 13  Difficult doing work/chores Somewhat difficult Somewhat difficult    Flowsheet Row  ED from 11/29/2022 in [facility] ED from 08/22/2022 in [facility] Emergency Department at [facility] ED from 05/16/2022 in [facility] Emergency Department [facility]  C-SSRS RISK CATEGORY No Risk No Risk No Risk       Musculoskeletal  Strength & Muscle Tone: within normal limits Gait & Station: normal Patient leans: N/A  Psychiatric Specialty Exam  Presentation  General Appearance:  Appropriate for Environment  Eye Contact: Fair  Speech: Clear and Coherent; Normal Rate  Speech Volume: Decreased  Handedness: Right   Mood and Affect  Mood: Euthymic  Affect: Flat   Thought Process  Thought Processes: Disorganized  Descriptions of Associations:Tangential  Orientation:Full (Time, Place and Person)  Thought Content:Paranoid Ideation; Tangential; Illogical; Delusions (Ideas of reference)  Diagnosis of Schizophrenia or Schizoaffective disorder in past: Yes  Duration of Psychotic Symptoms: Greater than six months   Hallucinations:Hallucinations: Auditory Description of Auditory Hallucinations: Patient reports hearing a woman's voice daily that "tells me go there, don't do that, pushing me around to do stuff"  Ideas of Reference:Paranoia; Delusions  Suicidal Thoughts:Suicidal Thoughts: No  Homicidal Thoughts:Homicidal Thoughts: No   Sensorium  Memory: Immediate Fair; Recent Fair; Remote Fair  Judgment: Poor  Insight: Lacking   Executive Functions  Concentration: Fair  Attention Span: Fair  Recall: Fair  Fund of Knowledge: Fair  Language: Good   Psychomotor Activity  Psychomotor Activity: Psychomotor Activity: Normal   Assets  Assets: Communication Skills; Desire for Improvement; Financial Resources/Insurance; Physical Health; Resilience   Sleep  Sleep: Sleep: Poor Number of Hours of Sleep: 0 (3-4)   Nutritional Assessment (For OBS and FBC admissions only) Has the patient  had a weight loss or gain of 10 pounds or more in the last 3 months?: No Has the patient had a decrease in food intake/or appetite?: No Does the patient have dental problems?: No Does the patient have eating habits or behaviors that may be indicators of an eating disorder including binging or inducing vomiting?: No Has the patient recently lost weight without trying?: 0 Has the patient been eating poorly because of a decreased appetite?: 0 Malnutrition Screening Tool Score: 0    Physical Exam  Physical Exam Vitals and nursing note reviewed.  Constitutional:      General: He is not in acute distress.    Appearance: Normal appearance. He is not ill-appearing.  HENT:     Head: Normocephalic and atraumatic.     Nose: Nose normal.  Eyes:     General:        Right eye: No discharge.        Left eye: No discharge.     Conjunctiva/sclera: Conjunctivae normal.  Cardiovascular:  Rate and Rhythm: Normal rate.  Pulmonary:     Effort: Pulmonary effort is normal. No respiratory distress.  Musculoskeletal:        General: Normal range of motion.     Cervical back: Normal range of motion.  Skin:    General: Skin is warm and dry.  Neurological:     General: No focal deficit present.     Mental Status: He is alert and oriented to person, place, and time. Mental status is at baseline.  Psychiatric:        Attention and Perception: Attention normal. He perceives auditory hallucinations.        Mood and Affect: Mood normal. Affect is flat.        Speech: Speech normal.        Behavior: Behavior normal. Behavior is cooperative.        Thought Content: Thought content is paranoid and delusional (Ideas of reference). Thought content does not include homicidal or suicidal ideation. Thought content does not include homicidal or suicidal plan.        Cognition and Memory: Cognition and memory normal.     Comments: Judgment: Poor    Review of Systems  Constitutional: Negative.   HENT:  Negative.    Eyes: Negative.   Respiratory: Negative.    Cardiovascular: Negative.   Gastrointestinal: Negative.   Genitourinary: Negative.   Musculoskeletal: Negative.   Skin: Negative.   Neurological: Negative.   Endo/Heme/Allergies: Negative.   Psychiatric/Behavioral:  Positive for depression and hallucinations. Negative for memory loss, substance abuse and suicidal ideas. The patient has insomnia. The patient is not nervous/anxious.        Paranoia, delusions, ideas of reference   Blood pressure (!) 140/88, pulse 94, temperature 98.9 F (37.2 C), resp. rate 20, SpO2 100%. There is no height or weight on file to calculate BMI.  Demographic Factors:  Male, Low socioeconomic status, Living alone, and Unemployed  Loss Factors: NA  Historical Factors: NA  Risk Reduction Factors:   NA  Continued Clinical Symptoms:  Schizophrenia:   Command hallucinatons Less than 41 years old Paranoid or undifferentiated type More than one psychiatric diagnosis Currently Psychotic Previous Psychiatric Diagnoses and Treatments  Cognitive Features That Contribute To Risk:  None    Suicide Risk:  Mild:  Suicidal ideation of limited frequency, intensity, duration, and specificity.  There are no identifiable plans, no associated intent, mild dysphoria and related symptoms, good self-control (both objective and subjective assessment), few other risk factors, and identifiable protective factors, including available and accessible social support.  Plan Of Care/Follow-up recommendations:  Other:  Recommend inpatient psychiatric treatment -Patient placed under IVC, first exam completed -Start Zyprexa 5mg  daily  Disposition: Patient has been accepted to [facility] BMU room 307, accepting provider is Dr. Marlou Porch. EMTALA completed. Patient will be transferred via [facility]. Continue IVC.   Sunday Corn, NP 11/29/2022, 6:20 PM

## 2022-11-29 NOTE — ED Notes (Signed)
Patient A&O x 4, ambulatory. Patient transferred to BMU in no acute distress. Patient denied SI/HI, A/VH upon discharge. Pt belongings returned to patient from locker #3 intact. Patient escorted to [place] via staff for transport to BMU. Safety maintained.

## 2022-11-29 NOTE — Plan of Care (Signed)
Patient new to the unit tonight, hasn't had time to progress  Problem: Education: Goal: Knowledge of [facility] Education information/materials will improve Outcome: Not Progressing Goal: Emotional status will improve Outcome: Not Progressing Goal: Mental status will improve Outcome: Not Progressing Goal: Verbalization of understanding the information provided will improve Outcome: Not Progressing   Problem: Activity: Goal: Interest or engagement in activities will improve Outcome: Not Progressing Goal: Sleeping patterns will improve Outcome: Not Progressing   Problem: Coping: Goal: Ability to verbalize frustrations and anger appropriately will improve Outcome: Not Progressing Goal: Ability to demonstrate self-control will improve Outcome: Not Progressing   Problem: Health Behavior/Discharge Planning: Goal: Identification of resources available to assist in meeting health care needs will improve Outcome: Not Progressing Goal: Compliance with treatment plan for underlying cause of condition will improve Outcome: Not Progressing   Problem: Physical Regulation: Goal: Ability to maintain clinical measurements within normal limits will improve Outcome: Not Progressing   Problem: Safety: Goal: Periods of time without injury will increase Outcome: Not Progressing   Problem: Education: Goal: Knowledge of General Education information will improve Description: Including pain rating scale, medication(s)/side effects and non-pharmacologic comfort measures Outcome: Not Progressing   Problem: Health Behavior/Discharge Planning: Goal: Ability to manage health-related needs will improve Outcome: Not Progressing   Problem: Clinical Measurements: Goal: Ability to maintain clinical measurements within normal limits will improve Outcome: Not Progressing Goal: Will remain free from infection Outcome: Not Progressing Goal: Diagnostic test results will improve Outcome: Not  Progressing Goal: Respiratory complications will improve Outcome: Not Progressing Goal: Cardiovascular complication will be avoided Outcome: Not Progressing   Problem: Activity: Goal: Risk for activity intolerance will decrease Outcome: Not Progressing   Problem: Nutrition: Goal: Adequate nutrition will be maintained Outcome: Not Progressing   Problem: Coping: Goal: Level of anxiety will decrease Outcome: Not Progressing   Problem: Elimination: Goal: Will not experience complications related to bowel motility Outcome: Not Progressing Goal: Will not experience complications related to urinary retention Outcome: Not Progressing   Problem: Pain Managment: Goal: General experience of comfort will improve Outcome: Not Progressing   Problem: Safety: Goal: Ability to remain free from injury will improve Outcome: Not Progressing   Problem: Skin Integrity: Goal: Risk for impaired skin integrity will decrease Outcome: Not Progressing

## 2022-11-29 NOTE — Tx Team (Signed)
Initial Treatment Plan 11/29/2022 7:49 PM Shane Small ZOX:[id number]    PATIENT STRESSORS: Medication change or noncompliance   Substance abuse     PATIENT STRENGTHS: Motivation for treatment/growth  Supportive family/friends    PATIENT IDENTIFIED PROBLEMS: Acute Psychosis  Paranoia  Anxiety                 DISCHARGE CRITERIA:  Improved stabilization in mood, thinking, and/or behavior Verbal commitment to aftercare and medication compliance  PRELIMINARY DISCHARGE PLAN: Outpatient therapy Return to previous living arrangement  PATIENT/FAMILY INVOLVEMENT: This treatment plan has been presented to and reviewed with the patient, Shane Small.  The patient has been given the opportunity to ask questions and make suggestions.  Elmyra Ricks, RN 11/29/2022, 7:49 PM

## 2022-11-30 DIAGNOSIS — F200 Paranoid schizophrenia: Secondary | ICD-10-CM

## 2022-11-30 MED ORDER — OLANZAPINE 10 MG PO TABS
10.0000 mg | ORAL_TABLET | Freq: Four times a day (QID) | ORAL | Status: DC | PRN
  Filled 2022-11-30: qty 1

## 2022-11-30 MED ORDER — MIRTAZAPINE 15 MG PO TABS
15.0000 mg | ORAL_TABLET | Freq: Every day | ORAL | Status: DC
  Administered 2022-11-30 – 2022-12-09 (×10): 15 mg via ORAL
  Filled 2022-11-30 (×10): qty 1
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
## 2022-11-30 NOTE — BH IP Treatment Plan (Signed)
Interdisciplinary Treatment and Diagnostic Plan Update  11/30/2022 Time of Session: 9:42 AM  Shane Small [place] MRN: [id number]  Principal Diagnosis: Paranoid schizophrenia [facility])  Secondary Diagnoses: Principal Problem:   Paranoid schizophrenia (HCC)   Current Medications:  Current Facility-Administered Medications  Medication Dose Route Frequency Provider Last Rate Last Admin   acetaminophen (TYLENOL) tablet 650 mg  650 mg Oral Q6H PRN Sunday Corn, NP       alum & mag hydroxide-simeth (MAALOX/MYLANTA) 200-200-20 MG/5ML suspension 30 mL  30 mL Oral Q4H PRN Sunday Corn, NP       diphenhydrAMINE (BENADRYL) capsule 50 mg  50 mg Oral TID PRN Sunday Corn, NP       Or   diphenhydrAMINE (BENADRYL) injection 50 mg  50 mg Intramuscular TID PRN Sunday Corn, NP       haloperidol (HALDOL) tablet 5 mg  5 mg Oral TID PRN Sunday Corn, NP       Or   haloperidol lactate (HALDOL) injection 5 mg  5 mg Intramuscular TID PRN Sunday Corn, NP       hydrOXYzine (ATARAX) tablet 25 mg  25 mg Oral TID PRN Sunday Corn, NP   25 mg at 11/30/22 0903   LORazepam (ATIVAN) tablet 2 mg  2 mg Oral TID PRN Sunday Corn, NP       Or   LORazepam (ATIVAN) injection 2 mg  2 mg Intramuscular TID PRN Sunday Corn, NP       magnesium hydroxide (MILK OF MAGNESIA) suspension 30 mL  30 mL Oral Daily PRN Sunday Corn, NP       mirtazapine (REMERON) tablet 15 mg  15 mg Oral QHS Herrick, Richard Edward, DO       OLANZapine [facility]) tablet 10 mg  10 mg Oral Q6H PRN Sarina Ill, DO       traZODone (DESYREL) tablet 50 mg  50 mg Oral QHS PRN Sunday Corn, NP       PTA Medications: Medications Prior to Admission  Medication Sig Dispense Refill Last Dose   diphenhydrAMINE (BENADRYL) 25 mg capsule Take 25 mg by mouth every 8 (eight) hours as needed for allergies or sleep.      Multiple Vitamin (MULTIVITAMIN WITH MINERALS) TABS tablet Take 1 tablet by mouth daily.        Patient Stressors: Medication change or noncompliance   Substance abuse    Patient Strengths: Motivation for treatment/growth  Supportive family/friends   Treatment Modalities: Medication Management, Group therapy, Case management,  1 to 1 session with clinician, Psychoeducation, Recreational therapy.   Physician Treatment Plan for Primary Diagnosis: Paranoid schizophrenia (HCC) Long Term Goal(s): Improvement in symptoms so as ready for discharge   Short Term Goals: Ability to identify changes in lifestyle to reduce recurrence of condition will improve  Medication Management: Evaluate patient's response, side effects, and tolerance of medication regimen.  Therapeutic Interventions: 1 to 1 sessions, Unit Group sessions and Medication administration.  Evaluation of Outcomes: Progressing  Physician Treatment Plan for Secondary Diagnosis: Principal Problem:   Paranoid schizophrenia (HCC)  Long Term Goal(s): Improvement in symptoms so as ready for discharge   Short Term Goals: Ability to identify changes in lifestyle to reduce recurrence of condition will improve     Medication Management: Evaluate patient's response, side effects, and tolerance of medication regimen.  Therapeutic Interventions: 1 to 1 sessions, Unit Group sessions and Medication administration.  Evaluation of Outcomes: Progressing  RN Treatment Plan for Primary Diagnosis: Paranoid schizophrenia (HCC) Long Term Goal(s): Knowledge of disease and therapeutic regimen to maintain health will improve  Short Term Goals: Ability to remain free from injury will improve, Ability to verbalize frustration and anger appropriately will improve, Ability to demonstrate self-control, Ability to participate in decision making will improve, Ability to verbalize feelings will improve, Ability to disclose and discuss suicidal ideas, Ability to identify and develop effective coping behaviors will improve, and Compliance with  prescribed medications will improve  Medication Management: RN will administer medications as ordered by provider, will assess and evaluate patient's response and provide education to patient for prescribed medication. RN will report any adverse and/or side effects to prescribing provider.  Therapeutic Interventions: 1 on 1 counseling sessions, Psychoeducation, Medication administration, Evaluate responses to treatment, Monitor vital signs and CBGs as ordered, Perform/monitor CIWA, COWS, AIMS and Fall Risk screenings as ordered, Perform wound care treatments as ordered.  Evaluation of Outcomes: Progressing   LCSW Treatment Plan for Primary Diagnosis: Paranoid schizophrenia (HCC) Long Term Goal(s): Safe transition to appropriate next level of care at discharge, Engage patient in therapeutic group addressing interpersonal concerns.  Short Term Goals: Engage patient in aftercare planning with referrals and resources, Increase social support, Increase ability to appropriately verbalize feelings, Increase emotional regulation, Facilitate acceptance of mental health diagnosis and concerns, and Increase skills for wellness and recovery  Therapeutic Interventions: Assess for all discharge needs, 1 to 1 time with Social worker, Explore available resources and support systems, Assess for adequacy in community support network, Educate family and significant other(s) on suicide prevention, Complete Psychosocial Assessment, Interpersonal group therapy.  Evaluation of Outcomes: Progressing   Progress in Treatment: Attending groups: Yes. Participating in groups: Yes. Taking medication as prescribed: Yes. Toleration medication: Yes. Family/Significant other contact made: No, will contact:  CSW will contact if given permission  Patient understands diagnosis: Yes. Discussing patient identified problems/goals with staff: Yes. Medical problems stabilized or resolved: Yes. Denies suicidal/homicidal ideation:  Yes. Issues/concerns per patient self-inventory: No. Other: None   New problem(s) identified: No, Describe:  None   New Short Term/Long Term Goal(s):  elimination of symptoms of psychosis, medication management for mood stabilization; elimination of SI thoughts; development of comprehensive mental wellness plan.   Patient Goals:  "I don't know, I know I have a lot of stress going on and a lot of anxiety, depression"   Discharge Plan or Barriers: CSW will assist with appropriate discharge planning   Reason for Continuation of Hospitalization: Anxiety Depression Medication stabilization  Estimated Length of Stay:  Last 3 [place] Suicide Severity Risk Score: Flowsheet Row Admission (Current) from 11/29/2022 in [facility] INPATIENT BEHAVIORAL MEDICINE Most recent reading at 11/29/2022  7:32 PM ED from 11/29/2022 in [facility] Most recent reading at 11/29/2022  2:52 PM ED from 08/22/2022 in [facility] Emergency Department at [facility] Most recent reading at 08/22/2022  7:32 AM  C-SSRS RISK CATEGORY No Risk No Risk No Risk       Last PHQ 2/9 Scores:    01/07/2022   10:48 AM 04/08/2020   10:14 AM  Depression screen PHQ 2/9  Decreased Interest 0 2  Down, Depressed, Hopeless 1 1  PHQ - 2 Score 1 3  Altered sleeping 1 1  Tired, decreased energy 0 2  Change in appetite 0 2  Feeling bad or failure about yourself  1 1  Trouble concentrating 1 3  Moving slowly or fidgety/restless 0 1  Suicidal  thoughts 0 0  PHQ-9 Score 4 13  Difficult doing work/chores Somewhat difficult Somewhat difficult    Scribe for Treatment Team: Elza Rafter, [place] 11/30/2022 12:00 PM

## 2022-11-30 NOTE — H&P (Signed)
Psychiatric Admission Assessment Adult  Patient Identification: Shane Small MRN:  [id number] Date of Evaluation:  11/30/2022 Chief Complaint:  Paranoid schizophrenia (HCC) [F20.0] Principal Diagnosis: Paranoid schizophrenia (HCC) Diagnosis:  Principal Problem:   Paranoid schizophrenia (HCC)  History of Present Illness: Shane Small is a 37 year old African-American male who is involuntarily admitted to inpatient psychiatry for psychosis.  He was picked up by [facility] police brought to the emergency room where he was IVC.  Patient has a history of paranoid schizophrenia but does not want to take any antipsychotics.  He denies any suicidal ideation or homicidal ideation.  His thought process is very tangential and circumstantial but denies any outright auditory hallucinations.  He does admit to be depressed.  He is currently homeless.  He did take Abilify when he was admitted to the behavioral health Hospital a couple weeks ago.  But he states that the antipsychotics make him feel worse. Associated Signs/Symptoms: Depression Symptoms:  anxiety, (Hypo) Manic Symptoms:  Delusions, Anxiety Symptoms:  Excessive Worry, Psychotic Symptoms:  Delusions, PTSD Symptoms: NA Total Time spent with patient: 1 hour  Past Psychiatric History: Patient reports a past psychiatric hospitalization at [facility] although he is unsure when this was. Per chart review, patient was admitted to [facility] in May 2018 and has also presented to [facility], [facility], and [facility] multiple times from 2018-2023 for mental health concerns.   Is the patient at risk to self? Yes.    Has the patient been a risk to self in the past 6 months? Yes.    Has the patient been a risk to self within the distant past? Yes.    Is the patient a risk to others? No.  Has the patient been a risk to others in the past 6 months? No.  Has the patient been a risk to others within the distant past? No.   [place] Scale:  Flowsheet Row Admission (Current) from  11/29/2022 in [facility] INPATIENT BEHAVIORAL MEDICINE Most recent reading at 11/29/2022  7:32 PM ED from 11/29/2022 in [facility] Most recent reading at 11/29/2022  2:52 PM ED from 10/26/2022 in [facility] Emergency Department at [facility] Most recent reading at 10/26/2022  9:37 PM  C-SSRS RISK CATEGORY No Risk No Risk No Risk        Prior Inpatient Therapy: Yes.   If yes, describe  Prior Outpatient Therapy: Yes.   If yes, describe   Alcohol Screening: 1. How often do you have a drink containing alcohol?: Never 2. How many drinks containing alcohol do you have on a typical day when you are drinking?: 1 or 2 3. How often do you have six or more drinks on one occasion?: Never AUDIT-C Score: 0 4. How often during the last year have you found that you were not able to stop drinking once you had started?: Never 5. How often during the last year have you failed to do what was normally expected from you because of drinking?: Never 6. How often during the last year have you needed a first drink in the morning to get yourself going after a heavy drinking session?: Never 7. How often during the last year have you had a feeling of guilt of remorse after drinking?: Never 8. How often during the last year have you been unable to remember what happened the night before because you had been drinking?: Never 9. Have you or someone else been injured as a result of your drinking?: No 10.  Has a relative or friend or a doctor or another health worker been concerned about your drinking or suggested you cut down?: No Alcohol Use Disorder Identification Test Final Score (AUDIT): 0 Substance Abuse History in the last 12 months:  No. Consequences of Substance Abuse: NA Previous Psychotropic Medications: Yes  Psychological Evaluations: Yes  Past Medical History:  Past Medical History:  Diagnosis Date   Anxiety    Asthma    Daily headache    Depression    Paranoid schizophrenia  (HCC)    Schizophrenia (HCC)    Stroke (HCC)     Past Surgical History:  Procedure Laterality Date   NO PAST SURGERIES     Family History:  Family History  Problem Relation Age of Onset   Asthma Mother    Diabetes Father    Hypertension Father    Colon cancer Father 36   Family Psychiatric  History: Unremarkable Tobacco Screening:  Social History   Tobacco Use  Smoking Status Every Day   Types: Cigarettes  Smokeless Tobacco Never    BH Tobacco Counseling     Are you interested in Tobacco Cessation Medications?  No, patient refused Counseled patient on smoking cessation:  Refused/Declined practical counseling Reason Tobacco Screening Not Completed: No value filed.       Social History:  Social History   Substance and Sexual Activity  Alcohol Use Yes   Alcohol/week: 56.0 standard drinks of alcohol   Types: 28 Cans of beer, 28 Shots of liquor per week   Comment: occasionally     Social History   Substance and Sexual Activity  Drug Use Yes   Comment: CBD    Additional Social History:                           Allergies:  No Known Allergies Lab Results:  Results for orders placed or performed during the hospital encounter of 11/29/22 (from the past 48 hour(s))  CBC with Differential/Platelet     Status: None   Collection Time: 11/29/22  2:10 PM  Result Value Ref Range   WBC 6.3 4.0 - 10.5 K/uL   RBC 4.40 4.22 - 5.81 MIL/uL   Hemoglobin 14.4 13.0 - 17.0 g/dL   HCT 40.9 81.1 - 91.4 %   MCV 99.5 80.0 - 100.0 fL   MCH 32.7 26.0 - 34.0 pg   MCHC 32.9 30.0 - 36.0 g/dL   RDW 78.2 95.6 - 21.3 %   Platelets 310 150 - 400 K/uL   nRBC 0.0 0.0 - 0.2 %   Neutrophils Relative % 60 %   Neutro Abs 3.8 1.7 - 7.7 K/uL   Lymphocytes Relative 28 %   Lymphs Abs 1.8 0.7 - 4.0 K/uL   Monocytes Relative 8 %   Monocytes Absolute 0.5 0.1 - 1.0 K/uL   Eosinophils Relative 3 %   Eosinophils Absolute 0.2 0.0 - 0.5 K/uL   Basophils Relative 0 %   Basophils  Absolute 0.0 0.0 - 0.1 K/uL   Immature Granulocytes 1 %   Abs Immature Granulocytes 0.03 0.00 - 0.07 K/uL    Comment: Performed at [facility], [address] [place], [place] [address]  Comprehensive metabolic panel     Status: Abnormal   Collection Time: 11/29/22  2:10 PM  Result Value Ref Range   Sodium 137 135 - 145 mmol/L   Potassium 3.5 3.5 - 5.1 mmol/L   Chloride 101 98 - 111  mmol/L   CO2 27 22 - 32 mmol/L   Glucose, Bld 94 70 - 99 mg/dL    Comment: Glucose reference range applies only to samples taken after fasting for at least 8 hours.   BUN 13 6 - 20 mg/dL   Creatinine, Ser 3.08 0.61 - 1.24 mg/dL   Calcium 9.4 8.9 - 65.7 mg/dL   Total Protein 8.6 (H) 6.5 - 8.1 g/dL   Albumin 4.6 3.5 - 5.0 g/dL   AST 47 (H) 15 - 41 U/L   ALT 34 0 - 44 U/L   Alkaline Phosphatase 76 38 - 126 U/L   Total Bilirubin 1.0 0.3 - 1.2 mg/dL   GFR, Estimated >84 >69 mL/min    Comment: (NOTE) Calculated using the CKD-EPI Creatinine Equation (2021)    Anion gap 9 5 - 15    Comment: Performed at [facility], [address], [place], [place] [address]  Hemoglobin A1c     Status: Abnormal   Collection Time: 11/29/22  2:10 PM  Result Value Ref Range   Hgb A1c MFr Bld 4.6 (L) 4.8 - 5.6 %    Comment: (NOTE) Pre diabetes:          5.7%-6.4%  Diabetes:              >6.4%  Glycemic control for   <7.0% adults with diabetes    Mean Plasma Glucose 85.32 mg/dL    Comment: Performed at [facility], [address] [place], [place] [address]  Magnesium     Status: None   Collection Time: 11/29/22  2:10 PM  Result Value Ref Range   Magnesium 2.3 1.7 - 2.4 mg/dL    Comment: Performed at [facility], [address], [place], [place] [address]  Ethanol     Status: None   Collection Time: 11/29/22  2:10 PM  Result Value Ref Range   Alcohol, Ethyl (B) <10 <10 mg/dL    Comment: (NOTE) Lowest detectable limit for serum alcohol is 10 mg/dL.  For medical purposes only. Performed  at [facility], [address] [place], [place] [address]   Lipid panel     Status: Abnormal   Collection Time: 11/29/22  2:10 PM  Result Value Ref Range   Cholesterol 201 (H) 0 - 200 mg/dL   Triglycerides 69 <664 mg/dL   HDL 73 >40 mg/dL   Total CHOL/HDL Ratio 2.8 RATIO   VLDL 14 0 - 40 mg/dL   LDL Cholesterol 347 (H) 0 - 99 mg/dL    Comment:        Total Cholesterol/HDL:CHD Risk Coronary Heart Disease Risk Table                     Men   Women  1/2 Average Risk   3.4   3.3  Average Risk       5.0   4.4  2 X Average Risk   9.6   7.1  3 X Average Risk  23.4   11.0        Use the calculated Patient Ratio above and the CHD Risk Table to determine the patient's CHD Risk.        ATP III CLASSIFICATION (LDL):  <100     mg/dL   Optimal  425-956  mg/dL   Near or Above                    Optimal  130-159  mg/dL   Borderline  161-096  mg/dL   High  >045     mg/dL   Very High Performed at [facility], [address], [place], [place] [address]   TSH     Status: None   Collection Time: 11/29/22  2:10 PM  Result Value Ref Range   TSH 0.949 0.350 - 4.500 uIU/mL    Comment: Performed by a 3rd Generation assay with a functional sensitivity of <=0.01 uIU/mL. Performed at [facility], [address] [place], [place] [address]   Prolactin     Status: None   Collection Time: 11/29/22  2:10 PM  Result Value Ref Range   Prolactin 5.2 3.9 - 22.7 ng/mL    Comment: (NOTE) Performed At: [facility] [address] [place], [place] [id number] Jolene Schimke MD QM:[id number]     Blood Alcohol level:  Lab Results  Component Value Date   [facility] <10 11/29/2022   ETH <10 01/05/2022    Metabolic Disorder Labs:  Lab Results  Component Value Date   HGBA1C 4.6 (L) 11/29/2022   MPG 85.32 11/29/2022   MPG 73.84 01/05/2022   Lab Results  Component Value Date   PROLACTIN 5.2 11/29/2022   PROLACTIN 8.1 09/11/2016   Lab Results  Component Value Date   CHOL  201 (H) 11/29/2022   TRIG 69 11/29/2022   HDL 73 11/29/2022   CHOLHDL 2.8 11/29/2022   VLDL 14 11/29/2022   LDLCALC 114 (H) 11/29/2022   LDLCALC 98 01/05/2022    Current Medications: Current Facility-Administered Medications  Medication Dose Route Frequency Provider Last Rate Last Admin   acetaminophen (TYLENOL) tablet 650 mg  650 mg Oral Q6H PRN Sunday Corn, NP       alum & mag hydroxide-simeth (MAALOX/MYLANTA) 200-200-20 MG/5ML suspension 30 mL  30 mL Oral Q4H PRN Sunday Corn, NP       diphenhydrAMINE (BENADRYL) capsule 50 mg  50 mg Oral TID PRN Sunday Corn, NP       Or   diphenhydrAMINE (BENADRYL) injection 50 mg  50 mg Intramuscular TID PRN Sunday Corn, NP       haloperidol (HALDOL) tablet 5 mg  5 mg Oral TID PRN Sunday Corn, NP       Or   haloperidol lactate (HALDOL) injection 5 mg  5 mg Intramuscular TID PRN Sunday Corn, NP       hydrOXYzine (ATARAX) tablet 25 mg  25 mg Oral TID PRN Sunday Corn, NP   25 mg at 11/30/22 0903   LORazepam (ATIVAN) tablet 2 mg  2 mg Oral TID PRN Sunday Corn, NP       Or   LORazepam (ATIVAN) injection 2 mg  2 mg Intramuscular TID PRN Sunday Corn, NP       magnesium hydroxide (MILK OF MAGNESIA) suspension 30 mL  30 mL Oral Daily PRN Sunday Corn, NP       OLANZapine zydis (ZYPREXA) disintegrating tablet 5 mg  5 mg Oral Daily Sunday Corn, NP       traZODone (DESYREL) tablet 50 mg  50 mg Oral QHS PRN Sunday Corn, NP       PTA Medications: Medications Prior to Admission  Medication Sig Dispense Refill Last Dose   diphenhydrAMINE (BENADRYL) 25 mg capsule Take 25 mg by mouth every 8 (eight) hours as needed for allergies or sleep.      Multiple Vitamin (  MULTIVITAMIN WITH MINERALS) TABS tablet Take 1 tablet by mouth daily.       Musculoskeletal: Strength & Muscle Tone: within normal limits Gait & Station: normal Patient leans: N/A            Psychiatric Specialty  Exam:  Presentation  General Appearance:  Appropriate for Environment  Eye Contact: Fair  Speech: Clear and Coherent; Normal Rate  Speech Volume: Decreased  Handedness: Right   Mood and Affect  Mood: Euthymic  Affect: Flat   Thought Process  Thought Processes: Disorganized  Duration of Psychotic Symptoms:N/A Past Diagnosis of Schizophrenia or Psychoactive disorder: Yes  Descriptions of Associations:Tangential  Orientation:Full (Time, Place and Person)  Thought Content:Paranoid Ideation; Tangential; Illogical; Delusions (Ideas of reference)  Hallucinations:Hallucinations: Auditory Description of Auditory Hallucinations: Patient reports hearing a woman's voice daily that "tells me go there, don't do that, pushing me around to do stuff"  Ideas of Reference:Paranoia; Delusions  Suicidal Thoughts:Suicidal Thoughts: No  Homicidal Thoughts:Homicidal Thoughts: No   Sensorium  Memory: Immediate Fair; Recent Fair; Remote Fair  Judgment: Poor  Insight: Lacking   Executive Functions  Concentration: Fair  Attention Span: Fair  Recall: Fair  Fund of Knowledge: Fair  Language: Good   Psychomotor Activity  Psychomotor Activity: Psychomotor Activity: Normal   Assets  Assets: Communication Skills; Desire for Improvement; Financial Resources/Insurance; Physical Health; Resilience   Sleep  Sleep: Sleep: Poor Number of Hours of Sleep: 0 (3-4)    Physical Exam: Physical Exam Vitals and nursing note reviewed.  Constitutional:      Appearance: Normal appearance. He is normal weight.  HENT:     Head: Normocephalic and atraumatic.     Nose: Nose normal.     Mouth/Throat:     Pharynx: Oropharynx is clear.  Eyes:     Extraocular Movements: Extraocular movements intact.     Pupils: Pupils are equal, round, and reactive to light.  Cardiovascular:     Rate and Rhythm: Normal rate and regular rhythm.     Pulses: Normal pulses.     Heart  sounds: Normal heart sounds.  Pulmonary:     Effort: Pulmonary effort is normal.     Breath sounds: Normal breath sounds.  Abdominal:     General: Abdomen is flat. Bowel sounds are normal.     Palpations: Abdomen is soft.  Genitourinary:    Penis: Normal.      Testes: Normal.     Rectum: Normal.  Musculoskeletal:        General: Normal range of motion.     Cervical back: Normal range of motion and neck supple.  Skin:    General: Skin is warm and dry.  Neurological:     General: No focal deficit present.     Mental Status: He is alert and oriented to person, place, and time.  Psychiatric:        Attention and Perception: Attention normal. He perceives auditory hallucinations.        Mood and Affect: Mood is depressed.        Speech: Speech normal.        Behavior: Behavior normal. Behavior is cooperative.        Thought Content: Thought content is paranoid and delusional.        Cognition and Memory: Cognition and memory normal.        Judgment: Judgment is inappropriate.    Review of Systems  Constitutional: Negative.   HENT: Negative.    Eyes: Negative.   Respiratory:  Negative.    Cardiovascular: Negative.   Gastrointestinal: Negative.   Genitourinary: Negative.   Musculoskeletal: Negative.   Skin: Negative.   Neurological: Negative.   Endo/Heme/Allergies: Negative.   Psychiatric/Behavioral:  Positive for depression and hallucinations. The patient has insomnia.    Blood pressure (!) 101/57, pulse 63, temperature 98.3 F (36.8 C), temperature source Oral, resp. rate 18, height 5\' 11"  (1.803 m), weight 65.8 kg, SpO2 99%. Body mass index is 20.23 kg/m.  Treatment Plan Summary: Daily contact with patient to assess and evaluate symptoms and progress in treatment, Medication management, and Plan see orders  Observation Level/Precautions:  15 minute checks  Laboratory:  CBC  Psychotherapy:    Medications:    Consultations:    Discharge Concerns:    Estimated LOS:   Other:     Physician Treatment Plan for Primary Diagnosis: Paranoid schizophrenia (HCC) Long Term Goal(s): Improvement in symptoms so as ready for discharge  Short Term Goals: Ability to identify changes in lifestyle to reduce recurrence of condition will improve  Physician Treatment Plan for Secondary Diagnosis: Principal Problem:   Paranoid schizophrenia (HCC)   I certify that inpatient services furnished can reasonably be expected to improve the patient's condition.    Sarina Ill, DO 8/2/202411:45 AM

## 2022-11-30 NOTE — Progress Notes (Signed)
   11/30/22 0903  Psych Admission Type (Psych Patients Only)  Admission Status Involuntary  Psychosocial Assessment  Patient Complaints Anxiety;Irritability;Nervousness  Eye Contact Watchful  Facial Expression Animated;Anxious  Affect Anxious;Fearful  Speech Slow  Interaction Guarded;Cautious;Minimal  Motor Activity Slow  Appearance/Hygiene Body odor  Behavior Characteristics Appropriate to situation;Unwilling to participate  Mood Anxious;Suspicious  Thought Process  Coherency Disorganized  Content Blaming others;Preoccupation  Delusions Paranoid  Perception Derealization  Hallucination None reported or observed  Judgment WDL  Confusion Mild  Danger to Self  Current suicidal ideation? Denies  Agreement Not to Harm Self Yes  Description of Agreement Verbal  Danger to Others  Danger to Others None reported or observed   Patient's thought process is disorganized.  Patient is alert and oriented.  Patient is suspicious and paranoid. Patient refused antipsychotics.  Patient took hydroxyzine for anxiety.  Patient participated in Treatment Team.  Patient remains on Q 15 minute checks.

## 2022-11-30 NOTE — [facility] Suicide Risk Assessment (Signed)
[facility] INPATIENT:  Family/Significant Other Suicide Prevention Education  Suicide Prevention Education:  Patient Refusal for Family/Significant Other Suicide Prevention Education: The patient Shane Small has refused to provide written consent for family/significant other to be provided Family/Significant Other Suicide Prevention Education during admission and/or prior to discharge.  Physician notified.   SPE completed with pt, as pt refused to consent to family contact. SPI pamphlet provided to pt and pt was encouraged to share information with support network, ask questions, and talk about any concerns relating to SPE. Pt denies access to guns/firearms and verbalized understanding of information provided. Mobile Crisis information also provided to pt.  Harden Mo 11/30/2022, 4:30 PM

## 2022-11-30 NOTE — Group Note (Signed)
Recreation Therapy Group Note   Group Topic:Leisure Education  Group Date: 11/30/2022 Start Time: 1030 End Time: 1130 Facilitators: Rosina Lowenstein, LRT, CTRS Location:  Craft Room  Group Description: Leisure. Patients were given the option to choose from coloring, singing karaoke, making origami, or playing cards. LRT and pts discussed the meaning of leisure, the importance of participating in leisure during their free time/when they're outside of the hospital, as well as how our leisure interests can also serve as coping skills. Pt identified two leisure interests and shared with the group.   Goal Area(s) Addressed:  Patient will identify a current leisure interest.  Patient will learn the definition of "leisure". Patient will practice making a positive decision. Patient will have the opportunity to try a new leisure activity. Patient will communicate with peers and LRT.    Affect/Mood: N/A   Participation Level: Did not attend    Clinical Observations/Individualized Feedback: Shane Small did not attend group.  Plan: Continue to engage patient in RT group sessions 2-3x/week.   Rosina Lowenstein, LRT, CTRS 11/30/2022 12:33 PM

## 2022-11-30 NOTE — Plan of Care (Signed)
  Problem: Activity: Goal: Interest or engagement in activities will improve Outcome: Progressing

## 2022-12-01 DIAGNOSIS — F200 Paranoid schizophrenia: Secondary | ICD-10-CM | POA: Diagnosis not present

## 2022-12-01 MED ORDER — PAROXETINE HCL 20 MG PO TABS
20.0000 mg | ORAL_TABLET | Freq: Every day | ORAL | Status: DC
  Administered 2022-12-01 – 2022-12-10 (×10): 20 mg via ORAL
  Filled 2022-12-01 (×10): qty 1

## 2022-12-01 NOTE — Group Note (Signed)
Date:  12/01/2022 Time:  1:43 AM  Group Topic/Focus:  Goals Group:   The focus of this group is to help patients establish daily goals to achieve during treatment and discuss how the patient can incorporate goal setting into their daily lives to aide in recovery.    Participation Level:  Did Not Attend   Lenore Cordia 12/01/2022, 1:43 AM

## 2022-12-01 NOTE — Group Note (Signed)
Date:  12/01/2022 Time:  10:56 PM  Group Topic/Focus:  Dimensions of Wellness:   The focus of this group is to introduce the topic of wellness and discuss the role each dimension of wellness plays in total health.    Participation Level:  Did Not Attend  Participation Quality:   pt did not attend  Affect:   pt did not attend  Cognitive:   pt did not attend  Insight: None  Engagement in Group:   pt did not attend  Modes of Intervention:   pt did not sttend  Additional Comments:     Aziel Morgan 12/01/2022, 10:56 PM

## 2022-12-01 NOTE — Group Note (Signed)
LCSW Group Therapy Note  Group Date: 12/01/2022 Start Time: 1310 End Time: 1400   Type of Therapy and Topic:  Group Therapy - Coping Skills  Participation Level:  Did Not Attend   Description of Group The focus of this group was to determine what unhealthy coping techniques typically are used by group members and what healthy coping techniques would be helpful in coping with various problems. Patients were guided in becoming aware of the differences between healthy and unhealthy coping techniques. Patients were asked to identify 2-3 healthy coping skills they would like to learn to use more effectively.  Therapeutic Goals Patients learned that coping is what human beings do all day long to deal with various situations in their lives Patients defined and discussed healthy vs unhealthy coping techniques Patients identified their preferred coping techniques and identified whether these were healthy or unhealthy Patients determined 2-3 healthy coping skills they would like to become more familiar with and use more often. Patients provided support and ideas to each other   Summary of Patient Progress:  The patient did not attend group.     Marshell Levan, LCSWA 12/01/2022  2:25 PM

## 2022-12-01 NOTE — Progress Notes (Signed)
[facility] MD Progress Note  12/01/2022 1:25 PM Shane Small  MRN:  [id number] Subjective:  Shane Small is seen on rounds.  He is still guarded and paranoid.  Nurses report he has been in good controls.  When asked if he hears voices he says not really.  He does not want to take any antipsychotics because he does not like how they make him feel.  He did well with Remeron at nighttime and slept well.  I tell him as an antidepressant.  He complains of anxiety and depression.  He wants something for his anxiety and I think something like Paxil would work for both.  We will see how he does with that. Principal Problem: Paranoid schizophrenia (HCC) Diagnosis: Principal Problem:   Paranoid schizophrenia (HCC)  Total Time spent with patient: 15 minutes  Past Psychiatric History: Schizophrenia  Past Medical History:  Past Medical History:  Diagnosis Date   Anxiety    Asthma    Daily headache    Depression    Paranoid schizophrenia (HCC)    Schizophrenia (HCC)    Stroke (HCC)     Past Surgical History:  Procedure Laterality Date   NO PAST SURGERIES     Family History:  Family History  Problem Relation Age of Onset   Asthma Mother    Diabetes Father    Hypertension Father    Colon cancer Father 97   Family Psychiatric  History: Unremarkable Social History:  Social History   Substance and Sexual Activity  Alcohol Use Yes   Alcohol/week: 56.0 standard drinks of alcohol   Types: 28 Cans of beer, 28 Shots of liquor per week   Comment: occasionally     Social History   Substance and Sexual Activity  Drug Use Yes   Comment: CBD    Social History   Socioeconomic History   Marital status: Single    Spouse name: Not on file   Number of children: 1   Years of education: Not on file   Highest education level: Not on file  Occupational History   Occupation: Location manager  Tobacco Use   Smoking status: Every Day    Types: Cigarettes   Smokeless tobacco: Never  Vaping Use   Vaping  status: Former  Substance and Sexual Activity   Alcohol use: Yes    Alcohol/week: 56.0 standard drinks of alcohol    Types: 28 Cans of beer, 28 Shots of liquor per week    Comment: occasionally   Drug use: Yes    Comment: CBD   Sexual activity: Not on file  Other Topics Concern   Not on file  Social History Narrative   Not on file   Social Determinants of Health   Financial Resource Strain: Not on file  Food Insecurity: No Food Insecurity (11/29/2022)   Hunger Vital Sign    Worried About Running Out of Food in the Last Year: Never true    Ran Out of Food in the Last Year: Never true  Transportation Needs: Patient Declined (11/29/2022)   PRAPARE - Administrator, Civil Service (Medical): Patient declined    Lack of Transportation (Non-Medical): Patient declined  Physical Activity: Not on file  Stress: Not on file  Social Connections: Not on file   Additional Social History:                         Sleep: Good  Appetite:  Good  Current Medications: Current Facility-Administered  Medications  Medication Dose Route Frequency Provider Last Rate Last Admin   acetaminophen (TYLENOL) tablet 650 mg  650 mg Oral Q6H PRN Sunday Corn, NP       alum & mag hydroxide-simeth (MAALOX/MYLANTA) 200-200-20 MG/5ML suspension 30 mL  30 mL Oral Q4H PRN Sunday Corn, NP       diphenhydrAMINE (BENADRYL) capsule 50 mg  50 mg Oral TID PRN Sunday Corn, NP       Or   diphenhydrAMINE (BENADRYL) injection 50 mg  50 mg Intramuscular TID PRN Sunday Corn, NP       haloperidol (HALDOL) tablet 5 mg  5 mg Oral TID PRN Sunday Corn, NP       Or   haloperidol lactate (HALDOL) injection 5 mg  5 mg Intramuscular TID PRN Sunday Corn, NP       hydrOXYzine (ATARAX) tablet 25 mg  25 mg Oral TID PRN Sunday Corn, NP   25 mg at 11/30/22 1610   LORazepam (ATIVAN) tablet 2 mg  2 mg Oral TID PRN Sunday Corn, NP       Or   LORazepam (ATIVAN) injection 2 mg   2 mg Intramuscular TID PRN Sunday Corn, NP       magnesium hydroxide (MILK OF MAGNESIA) suspension 30 mL  30 mL Oral Daily PRN Sunday Corn, NP       mirtazapine (REMERON) tablet 15 mg  15 mg Oral QHS Sarina Ill, DO   15 mg at 11/30/22 2129   OLANZapine (ZYPREXA) tablet 10 mg  10 mg Oral Q6H PRN Sarina Ill, DO       PARoxetine (PAXIL) tablet 20 mg  20 mg Oral Daily Sarina Ill, DO       traZODone (DESYREL) tablet 50 mg  50 mg Oral QHS PRN Sunday Corn, NP        Lab Results:  Results for orders placed or performed during the hospital encounter of 11/29/22 (from the past 48 hour(s))  CBC with Differential/Platelet     Status: None   Collection Time: 11/29/22  2:10 PM  Result Value Ref Range   WBC 6.3 4.0 - 10.5 K/uL   RBC 4.40 4.22 - 5.81 MIL/uL   Hemoglobin 14.4 13.0 - 17.0 g/dL   HCT 96.0 45.4 - 09.8 %   MCV 99.5 80.0 - 100.0 fL   MCH 32.7 26.0 - 34.0 pg   MCHC 32.9 30.0 - 36.0 g/dL   RDW 11.9 14.7 - 82.9 %   Platelets 310 150 - 400 K/uL   nRBC 0.0 0.0 - 0.2 %   Neutrophils Relative % 60 %   Neutro Abs 3.8 1.7 - 7.7 K/uL   Lymphocytes Relative 28 %   Lymphs Abs 1.8 0.7 - 4.0 K/uL   Monocytes Relative 8 %   Monocytes Absolute 0.5 0.1 - 1.0 K/uL   Eosinophils Relative 3 %   Eosinophils Absolute 0.2 0.0 - 0.5 K/uL   Basophils Relative 0 %   Basophils Absolute 0.0 0.0 - 0.1 K/uL   Immature Granulocytes 1 %   Abs Immature Granulocytes 0.03 0.00 - 0.07 K/uL    Comment: Performed at [facility], [address] [place], [place] [address]  Comprehensive metabolic panel     Status: Abnormal   Collection Time: 11/29/22  2:10 PM  Result Value Ref Range   Sodium 137 135 - 145 mmol/L   Potassium 3.5 3.5 -  5.1 mmol/L   Chloride 101 98 - 111 mmol/L   CO2 27 22 - 32 mmol/L   Glucose, Bld 94 70 - 99 mg/dL    Comment: Glucose reference range applies only to samples taken after fasting for at least 8 hours.   BUN 13 6 - 20 mg/dL    Creatinine, Ser 1.61 0.61 - 1.24 mg/dL   Calcium 9.4 8.9 - 09.6 mg/dL   Total Protein 8.6 (H) 6.5 - 8.1 g/dL   Albumin 4.6 3.5 - 5.0 g/dL   AST 47 (H) 15 - 41 U/L   ALT 34 0 - 44 U/L   Alkaline Phosphatase 76 38 - 126 U/L   Total Bilirubin 1.0 0.3 - 1.2 mg/dL   GFR, Estimated >04 >54 mL/min    Comment: (NOTE) Calculated using the CKD-EPI Creatinine Equation (2021)    Anion gap 9 5 - 15    Comment: Performed at [facility], [address] [place], [place] [address]  Hemoglobin A1c     Status: Abnormal   Collection Time: 11/29/22  2:10 PM  Result Value Ref Range   Hgb A1c MFr Bld 4.6 (L) 4.8 - 5.6 %    Comment: (NOTE) Pre diabetes:          5.7%-6.4%  Diabetes:              >6.4%  Glycemic control for   <7.0% adults with diabetes    Mean Plasma Glucose 85.32 mg/dL    Comment: Performed at [facility], [address], [place], [place] [address]  Magnesium     Status: None   Collection Time: 11/29/22  2:10 PM  Result Value Ref Range   Magnesium 2.3 1.7 - 2.4 mg/dL    Comment: Performed at [facility], [address] [place], [place] [address]  Ethanol     Status: None   Collection Time: 11/29/22  2:10 PM  Result Value Ref Range   Alcohol, Ethyl (B) <10 <10 mg/dL    Comment: (NOTE) Lowest detectable limit for serum alcohol is 10 mg/dL.  For medical purposes only. Performed at [facility], [address] [place], [place] [address]   Lipid panel     Status: Abnormal   Collection Time: 11/29/22  2:10 PM  Result Value Ref Range   Cholesterol 201 (H) 0 - 200 mg/dL   Triglycerides 69 <578 mg/dL   HDL 73 >46 mg/dL   Total CHOL/HDL Ratio 2.8 RATIO   VLDL 14 0 - 40 mg/dL   LDL Cholesterol 962 (H) 0 - 99 mg/dL    Comment:        Total Cholesterol/HDL:CHD Risk Coronary Heart Disease Risk Table                     Men   Women  1/2 Average Risk   3.4   3.3  Average Risk       5.0   4.4  2 X Average Risk   9.6   7.1  3 X Average Risk  23.4    11.0        Use the calculated Patient Ratio above and the CHD Risk Table to determine the patient's CHD Risk.        ATP III CLASSIFICATION (LDL):  <100     mg/dL   Optimal  952-841  mg/dL   Near or Above  Optimal  130-159  mg/dL   Borderline  161-096  mg/dL   High  >045     mg/dL   Very High Performed at [facility], [address], [place], [place] [address]   TSH     Status: None   Collection Time: 11/29/22  2:10 PM  Result Value Ref Range   TSH 0.949 0.350 - 4.500 uIU/mL    Comment: Performed by a 3rd Generation assay with a functional sensitivity of <=0.01 uIU/mL. Performed at [facility], [address] [place], [place] [address]   Prolactin     Status: None   Collection Time: 11/29/22  2:10 PM  Result Value Ref Range   Prolactin 5.2 3.9 - 22.7 ng/mL    Comment: (NOTE) Performed At: [facility] [address] [place], [place] [id number] Jolene Schimke MD QM:[id number]     Blood Alcohol level:  Lab Results  Component Value Date   [facility] <10 11/29/2022   ETH <10 01/05/2022    Metabolic Disorder Labs: Lab Results  Component Value Date   HGBA1C 4.6 (L) 11/29/2022   MPG 85.32 11/29/2022   MPG 73.84 01/05/2022   Lab Results  Component Value Date   PROLACTIN 5.2 11/29/2022   PROLACTIN 8.1 09/11/2016   Lab Results  Component Value Date   CHOL 201 (H) 11/29/2022   TRIG 69 11/29/2022   HDL 73 11/29/2022   CHOLHDL 2.8 11/29/2022   VLDL 14 11/29/2022   LDLCALC 114 (H) 11/29/2022   LDLCALC 98 01/05/2022    Physical Findings: AIMS:  , ,  ,  ,    CIWA:    COWS:     Musculoskeletal: Strength & Muscle Tone: within normal limits Gait & Station: normal Patient leans: N/A  Psychiatric Specialty Exam:  Presentation  General Appearance:  Appropriate for Environment  Eye Contact: Fair  Speech: Clear and Coherent; Normal Rate  Speech Volume: Decreased  Handedness: Right   Mood and Affect   Mood: Euthymic  Affect: Flat   Thought Process  Thought Processes: Disorganized  Descriptions of Associations:Tangential  Orientation:Full (Time, Place and Person)  Thought Content:Paranoid Ideation; Tangential; Illogical; Delusions (Ideas of reference)  History of Schizophrenia/Schizoaffective disorder:Yes  Duration of Psychotic Symptoms:Greater than six months  Hallucinations:No data recorded Ideas of Reference:Paranoia; Delusions  Suicidal Thoughts:No data recorded Homicidal Thoughts:No data recorded  Sensorium  Memory: Immediate Fair; Recent Fair; Remote Fair  Judgment: Poor  Insight: Lacking   Executive Functions  Concentration: Fair  Attention Span: Fair  Recall: Fair  Fund of Knowledge: Fair  Language: Good   Psychomotor Activity  Psychomotor Activity:No data recorded  Assets  Assets: Communication Skills; Desire for Improvement; Financial Resources/Insurance; Physical Health; Resilience   Sleep  Sleep:No data recorded    Blood pressure 97/63, pulse (!) 53, temperature 97.7 F (36.5 C), temperature source Oral, resp. rate 16, height 5\' 11"  (1.803 m), weight 65.8 kg, SpO2 100%. Body mass index is 20.23 kg/m.   Treatment Plan Summary: Daily contact with patient to assess and evaluate symptoms and progress in treatment, Medication management, and Plan start Paxil 20 mg/day.  Continue Remeron.  Consider Thorazine or something of an older antipsychotic that he has not been on.  Deserie Dirks [place], DO 12/01/2022, 1:25 PM

## 2022-12-01 NOTE — Plan of Care (Signed)
D- Patient alert and oriented. Pt is isolative to his room Denies SI, HI, AVH, and pain. .Pt is pleasant and cooperative   A- Scheduled medications administered to patient, per MD orders. Support and encouragement provided.  Routine safety checks conducted every 15 minutes.  Patient informed to notify staff with problems or concerns. R- No adverse drug reactions noted. Patient contracts for safety at this time. Patient compliant with medications and treatment plan. Patient receptive, calm, and cooperative. Patient interacts well with others on the unit.  Patient remains safe at this time.

## 2022-12-01 NOTE — Group Note (Signed)
Date:  12/01/2022 Time:  1:28 PM  Group Topic/Focus:  Goals Group:   The focus of this group is to help patients establish daily goals to achieve during treatment and discuss how the patient can incorporate goal setting into their daily lives to aide in recovery.    Participation Level:  Did Not Attend  Lynelle Smoke [facility] 12/01/2022, 1:28 PM

## 2022-12-01 NOTE — Group Note (Unsigned)
Date:  12/01/2022 Time:  8:53 PM  Group Topic/Focus:  Dimensions of Wellness:   The focus of this group is to introduce the topic of wellness and discuss the role each dimension of wellness plays in total health.     Participation Level:  {B[facility] PARTICIPATION NWGNF:62130}  Participation Quality:  {[facility] PARTICIPATION QUALITY:22265}  Affect:  {[facility] AFFECT:22266}  Cognitive:  {[facility] COGNITIVE:22267}  Insight: {BHH Insight2:20797}  Engagement in Group:  {B[facility] ENGAGEMENT IN QMVHQ:46962}  Modes of Intervention:  {B[facility] MODES OF INTERVENTION:22269}  Additional Comments:  ***  Gibbs Naugle 12/01/2022, 8:53 PM

## 2022-12-01 NOTE — Group Note (Unsigned)
Date:  12/01/2022 Time:  9:57 PM  Group Topic/Focus:  Dimensions of Wellness:   The focus of this group is to introduce the topic of wellness and discuss the role each dimension of wellness plays in total health.     Participation Level:  {[facility] PARTICIPATION JXBJY:78295}  Participation Quality:  {B[facility] PARTICIPATION QUALITY:22265}  Affect:  {[facility] AFFECT:22266}  Cognitive:  {[facility] COGNITIVE:22267}  Insight: {BHH Insight2:20797}  Engagement in Group:  {[facility] ENGAGEMENT IN AOZHY:86578}  Modes of Intervention:  {[facility] MODES OF INTERVENTION:22269}  Additional Comments:  ***  Shane Small 12/01/2022, 9:57 PM

## 2022-12-02 DIAGNOSIS — F200 Paranoid schizophrenia: Secondary | ICD-10-CM | POA: Diagnosis not present

## 2022-12-02 MED ORDER — ALBUTEROL SULFATE HFA 108 (90 BASE) MCG/ACT IN AERS
2.0000 | INHALATION_SPRAY | RESPIRATORY_TRACT | Status: DC | PRN
  Administered 2022-12-03 – 2022-12-10 (×11): 2 via RESPIRATORY_TRACT
  Filled 2022-12-02: qty 6.7

## 2022-12-02 NOTE — Plan of Care (Signed)
  Problem: Education: Goal: Knowledge of [facility] Education information/materials will improve Outcome: Progressing Goal: Emotional status will improve Outcome: Progressing Goal: Mental status will improve Outcome: Progressing Goal: Verbalization of understanding the information provided will improve Outcome: Progressing   Problem: Activity: Goal: Interest or engagement in activities will improve Outcome: Progressing Goal: Sleeping patterns will improve Outcome: Progressing   Problem: Coping: Goal: Ability to verbalize frustrations and anger appropriately will improve Outcome: Progressing Goal: Ability to demonstrate self-control will improve Outcome: Progressing   Problem: Health Behavior/Discharge Planning: Goal: Identification of resources available to assist in meeting health care needs will improve Outcome: Progressing Goal: Compliance with treatment plan for underlying cause of condition will improve Outcome: Progressing   Problem: Physical Regulation: Goal: Ability to maintain clinical measurements within normal limits will improve Outcome: Progressing   Problem: Safety: Goal: Periods of time without injury will increase Outcome: Progressing   Problem: Education: Goal: Knowledge of General Education information will improve Description: Including pain rating scale, medication(s)/side effects and non-pharmacologic comfort measures Outcome: Progressing   Problem: Health Behavior/Discharge Planning: Goal: Ability to manage health-related needs will improve Outcome: Progressing   Problem: Clinical Measurements: Goal: Ability to maintain clinical measurements within normal limits will improve Outcome: Progressing Goal: Will remain free from infection Outcome: Progressing Goal: Diagnostic test results will improve Outcome: Progressing Goal: Respiratory complications will improve Outcome: Progressing Goal: Cardiovascular complication will be  avoided Outcome: Progressing   Problem: Activity: Goal: Risk for activity intolerance will decrease Outcome: Progressing   Problem: Nutrition: Goal: Adequate nutrition will be maintained Outcome: Progressing   Problem: Coping: Goal: Level of anxiety will decrease Outcome: Progressing   Problem: Elimination: Goal: Will not experience complications related to bowel motility Outcome: Progressing Goal: Will not experience complications related to urinary retention Outcome: Progressing   Problem: Pain Managment: Goal: General experience of comfort will improve Outcome: Progressing   Problem: Safety: Goal: Ability to remain free from injury will improve Outcome: Progressing   Problem: Skin Integrity: Goal: Risk for impaired skin integrity will decrease Outcome: Progressing

## 2022-12-02 NOTE — Progress Notes (Signed)
[facility] MD Progress Note  12/02/2022 11:39 AM Shane Small  MRN:  [id number] Subjective: Shane Small is seen on rounds.  He states that he slept well last night.  He is taking Remeron at bedtime and Paxil in the morning for anxiety and depression.  He refuses to take any antipsychotic.  He has been in good controls.  Nurses report no issues.  He asks for an inhaler. Principal Problem: Paranoid schizophrenia (HCC) Diagnosis: Principal Problem:   Paranoid schizophrenia (HCC)  Total Time spent with patient: 15 minutes  Past Psychiatric History: Schizophrenia  Past Medical History:  Past Medical History:  Diagnosis Date   Anxiety    Asthma    Daily headache    Depression    Paranoid schizophrenia (HCC)    Schizophrenia (HCC)    Stroke (HCC)     Past Surgical History:  Procedure Laterality Date   NO PAST SURGERIES     Family History:  Family History  Problem Relation Age of Onset   Asthma Mother    Diabetes Father    Hypertension Father    Colon cancer Father 31   Family Psychiatric  History: Unremarkable Social History:  Social History   Substance and Sexual Activity  Alcohol Use Yes   Alcohol/week: 56.0 standard drinks of alcohol   Types: 28 Cans of beer, 28 Shots of liquor per week   Comment: occasionally     Social History   Substance and Sexual Activity  Drug Use Yes   Comment: CBD    Social History   Socioeconomic History   Marital status: Single    Spouse name: Not on file   Number of children: 1   Years of education: Not on file   Highest education level: Not on file  Occupational History   Occupation: Location manager  Tobacco Use   Smoking status: Every Day    Types: Cigarettes   Smokeless tobacco: Never  Vaping Use   Vaping status: Former  Substance and Sexual Activity   Alcohol use: Yes    Alcohol/week: 56.0 standard drinks of alcohol    Types: 28 Cans of beer, 28 Shots of liquor per week    Comment: occasionally   Drug use: Yes    Comment: CBD    Sexual activity: Not on file  Other Topics Concern   Not on file  Social History Narrative   Not on file   Social Determinants of Health   Financial Resource Strain: Not on file  Food Insecurity: No Food Insecurity (11/29/2022)   Hunger Vital Sign    Worried About Running Out of Food in the Last Year: Never true    Ran Out of Food in the Last Year: Never true  Transportation Needs: Patient Declined (11/29/2022)   PRAPARE - Administrator, Civil Service (Medical): Patient declined    Lack of Transportation (Non-Medical): Patient declined  Physical Activity: Not on file  Stress: Not on file  Social Connections: Not on file   Additional Social History:                         Sleep: Good  Appetite: Good  Current Medications: Current Facility-Administered Medications  Medication Dose Route Frequency Provider Last Rate Last Admin   acetaminophen (TYLENOL) tablet 650 mg  650 mg Oral Q6H PRN Sunday Corn, NP       albuterol (VENTOLIN HFA) 108 (90 Base) MCG/ACT inhaler 2 puff  2 puff Inhalation Q4H  PRN Sarina Ill, DO       alum & mag hydroxide-simeth (MAALOX/MYLANTA) 200-200-20 MG/5ML suspension 30 mL  30 mL Oral Q4H PRN Sunday Corn, NP       diphenhydrAMINE (BENADRYL) capsule 50 mg  50 mg Oral TID PRN Sunday Corn, NP       Or   diphenhydrAMINE (BENADRYL) injection 50 mg  50 mg Intramuscular TID PRN Sunday Corn, NP       haloperidol (HALDOL) tablet 5 mg  5 mg Oral TID PRN Sunday Corn, NP       Or   haloperidol lactate (HALDOL) injection 5 mg  5 mg Intramuscular TID PRN Sunday Corn, NP       hydrOXYzine (ATARAX) tablet 25 mg  25 mg Oral TID PRN Sunday Corn, NP   25 mg at 12/01/22 2117   LORazepam (ATIVAN) tablet 2 mg  2 mg Oral TID PRN Sunday Corn, NP       Or   LORazepam (ATIVAN) injection 2 mg  2 mg Intramuscular TID PRN Sunday Corn, NP       magnesium hydroxide (MILK OF MAGNESIA) suspension 30  mL  30 mL Oral Daily PRN Sunday Corn, NP       mirtazapine (REMERON) tablet 15 mg  15 mg Oral QHS Sarina Ill, DO   15 mg at 12/01/22 2119   OLANZapine (ZYPREXA) tablet 10 mg  10 mg Oral Q6H PRN Sarina Ill, DO       PARoxetine (PAXIL) tablet 20 mg  20 mg Oral Daily Sarina Ill, DO   20 mg at 12/02/22 1109   traZODone (DESYREL) tablet 50 mg  50 mg Oral QHS PRN Sunday Corn, NP   50 mg at 12/01/22 2117    Lab Results: No results found for this or any previous visit (from the past 48 hour(s)).  Blood Alcohol level:  Lab Results  Component Value Date   ETH <10 11/29/2022   ETH <10 01/05/2022    Metabolic Disorder Labs: Lab Results  Component Value Date   HGBA1C 4.6 (L) 11/29/2022   MPG 85.32 11/29/2022   MPG 73.84 01/05/2022   Lab Results  Component Value Date   PROLACTIN 5.2 11/29/2022   PROLACTIN 8.1 09/11/2016   Lab Results  Component Value Date   CHOL 201 (H) 11/29/2022   TRIG 69 11/29/2022   HDL 73 11/29/2022   CHOLHDL 2.8 11/29/2022   VLDL 14 11/29/2022   LDLCALC 114 (H) 11/29/2022   LDLCALC 98 01/05/2022    Physical Findings: AIMS:  , ,  ,  ,    CIWA:    COWS:     Musculoskeletal: Strength & Muscle Tone: Good Gait & Station: Good Patient leans: N/A  Psychiatric Specialty Exam:  Presentation  General Appearance:  Appropriate for Environment  Eye Contact: Fair  Speech: Clear and Coherent; Normal Rate  Speech Volume: Decreased  Handedness: Right   Mood and Affect  Mood: Euthymic  Affect: Flat   Thought Process  Thought Processes: Disorganized  Descriptions of Associations:Tangential  Orientation:Full (Time, Place and Person)  Thought Content:Paranoid Ideation; Tangential; Illogical; Delusions (Ideas of reference)  History of Schizophrenia/Schizoaffective disorder:Yes  Duration of Psychotic Symptoms:Greater than six months  Hallucinations:No data recorded Ideas of  Reference:Paranoia; Delusions  Suicidal Thoughts:No data recorded Homicidal Thoughts:No data recorded  Sensorium  Memory: Immediate Fair; Recent Fair; Remote Fair  Judgment: Poor  Insight: Lacking   Executive  Functions  Concentration: Fair  Attention Span: Fair  Recall: [employer] of Knowledge: Fair  Language: Good   Psychomotor Activity  Psychomotor Activity:No data recorded  Assets  Assets: Communication Skills; Desire for Improvement; Financial Resources/Insurance; Physical Health; Resilience   Sleep  Sleep:No data recorded    Blood pressure 101/60, pulse 61, temperature (!) 97.5 F (36.4 C), resp. rate 19, height 5\' 11"  (1.803 m), weight 65.8 kg, SpO2 100%. Body mass index is 20.23 kg/m.   Treatment Plan Summary: Daily contact with patient to assess and evaluate symptoms and progress in treatment, Medication management, and Plan continue current medications.  Albuterol inhaler  Sarina Ill, DO 12/02/2022, 11:39 AM

## 2022-12-02 NOTE — Progress Notes (Signed)
Pt reports fair sleep after taking sleep med. Pt reports good appetite, hyperactivity and good concentration.  Pt rates depression, hopelessness and anxiety at "10" but denies SI.  Pt denies physical problems.  Pt will discuss sleep medication with MD.  Continued monitoring for safety.   12/02/22 1700  Psych Admission Type (Psych Patients Only)  Admission Status Involuntary  Psychosocial Assessment  Patient Complaints Anxiety  Eye Contact Fair  Facial Expression Flat  Affect Anxious  Speech Slow  Interaction Isolative  Motor Activity Slow  Appearance/Hygiene Poor hygiene  Behavior Characteristics Anxious  Mood Anxious  Thought Process  Coherency Disorganized  Content Preoccupation  Delusions Paranoid  Perception WDL  Hallucination None reported or observed  Judgment WDL  Confusion Mild  Danger to Self  Current suicidal ideation? Denies  Danger to Others  Danger to Others None reported or observed

## 2022-12-02 NOTE — Group Note (Signed)
[facility] LCSW Group Therapy Note   Group Date: 12/02/2022 Start Time: 1300 End Time: 1400   Type of Therapy/Topic:  Group Therapy:  Balance in Life  Participation Level:  Did Not Attend   Description of Group:    This group will address the concept of balance and how it feels and looks when one is unbalanced. Patients will be encouraged to process areas in their lives that are out of balance, and identify reasons for remaining unbalanced. Facilitators will guide patients utilizing problem- solving interventions to address and correct the stressor making their life unbalanced. Understanding and applying boundaries will be explored and addressed for obtaining  and maintaining a balanced life. Patients will be encouraged to explore ways to assertively make their unbalanced needs known to significant others in their lives, using other group members and facilitator for support and feedback.  Therapeutic Goals: Patient will identify two or more emotions or situations they have that consume much of in their lives. Patient will identify signs/triggers that life has become out of balance:  Patient will identify two ways to set boundaries in order to achieve balance in their lives:  Patient will demonstrate ability to communicate their needs through discussion and/or role plays  Summary of Patient Progress:   The patient did not attend.      Marshell Levan, LCSW

## 2022-12-02 NOTE — Group Note (Signed)
Date:  12/02/2022 Time:  9:47 PM  Group Topic/Focus:   Wrap-Up Group:   The focus of this group is to help patients review their daily goal of treatment and discuss progress on daily workbooks.    Participation Level:  Active  Participation Quality:  Appropriate and Attentive  Affect:  Appropriate  Cognitive:  Appropriate  Insight: Appropriate and Good  Engagement in Group:  Supportive  Modes of Intervention:  Support  Additional Comments:     Belva Crome 12/02/2022, 9:47 PM

## 2022-12-03 DIAGNOSIS — F200 Paranoid schizophrenia: Secondary | ICD-10-CM | POA: Diagnosis not present

## 2022-12-03 NOTE — Plan of Care (Signed)
Patient isolates to his room most of the shift. Pleasant and cooperative on approach. Patient states that going to group triggers his anxiety. Patient positive for AH states that it is not bothering him now. Denies SI,HI and VH. Appetite and energy level good. ADLs maintained. Support and encouragement given.

## 2022-12-03 NOTE — Progress Notes (Addendum)
Pt stays to himself on the unit. He sits in the other room in front of the main dayroom due to feeling overstimulated. Pt also endorses paranoia that his peers may do something to hurt him. He endorses hearing more then 1 voice that is "gibberish" and hard from him to comprehend. He said that sometimes he'll have VH at night too when he doesn't sleep, which are also hard for him to make out. 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 He reports not taking his medications at home because he feels like he doesn't need them. He feels that his medication was "covering stuff up in my mind that's supposed to be happening." He reports that his sleep has been "okay" and his appetite has been good. He took his medications tonight (see MAR). He denies experiencing any side effects from his medications. He is isolative and minimally interacts with others. Pt denies SI/HI and VH. Active listening, reassurance, and support provided. Q 15 min safety checks continue. Pt's safety has been maintained.   12/02/22 2145  Psych Admission Type (Psych Patients Only)  Admission Status Involuntary  Psychosocial Assessment  Patient Complaints Anxiety;Suspiciousness;Worrying  Eye Contact Brief  Facial Expression Flat  Affect Anxious;Preoccupied;Flat  Speech Slow  Interaction Assertive;Cautious;Isolative  Motor Activity Slow  Appearance/Hygiene Poor hygiene  Behavior Characteristics Anxious  Mood Anxious;Preoccupied  Thought Process  Coherency Disorganized  Content Preoccupation;Paranoia  Delusions Paranoid  Perception Hallucinations  Hallucination Auditory (reports AH that are "gibberish" and hard for him to comprehend)  Judgment Poor  Confusion Mild  Danger to Self  Current suicidal ideation? Denies  Agreement Not to Harm Self Yes  Description of Agreement verbally contracts for safety  Danger to Others  Danger to Others None reported or observed

## 2022-12-03 NOTE — Group Note (Signed)
Date:  12/03/2022 Time:  10:01 AM  Group Topic/Focus:  OUTDOOR RECREATION AND RAPPORT BUILDING.    Participation Level:  Did Not Attend   Shane Small 12/03/2022, 10:01 AM

## 2022-12-03 NOTE — Progress Notes (Signed)
[facility] MD Progress Note  12/03/2022 11:08 AM Shane Small  MRN:  [id number] Subjective: Shane Small is seen on rounds.  He says that he is sleeping better and his anxiety is better controlled.  He is still hearing voices but denies any suicidal ideation.  I talked to him about starting an antipsychotic that he has been on and he said that he would think about it.  He does not like the atypicals.  Principal Problem: Paranoid schizophrenia (HCC) Diagnosis: Principal Problem:   Paranoid schizophrenia (HCC)  Total Time spent with patient: 15 minutes  Past Psychiatric History: Schizophrenia  Past Medical History:  Past Medical History:  Diagnosis Date   Anxiety    Asthma    Daily headache    Depression    Paranoid schizophrenia (HCC)    Schizophrenia (HCC)    Stroke (HCC)     Past Surgical History:  Procedure Laterality Date   NO PAST SURGERIES     Family History:  Family History  Problem Relation Age of Onset   Asthma Mother    Diabetes Father    Hypertension Father    Colon cancer Father 57   Family Psychiatric  History: Unremarkable Social History:  Social History   Substance and Sexual Activity  Alcohol Use Yes   Alcohol/week: 56.0 standard drinks of alcohol   Types: 28 Cans of beer, 28 Shots of liquor per week   Comment: occasionally     Social History   Substance and Sexual Activity  Drug Use Yes   Comment: CBD    Social History   Socioeconomic History   Marital status: Single    Spouse name: Not on file   Number of children: 1   Years of education: Not on file   Highest education level: Not on file  Occupational History   Occupation: Location manager  Tobacco Use   Smoking status: Every Day    Types: Cigarettes   Smokeless tobacco: Never  Vaping Use   Vaping status: Former  Substance and Sexual Activity   Alcohol use: Yes    Alcohol/week: 56.0 standard drinks of alcohol    Types: 28 Cans of beer, 28 Shots of liquor per week    Comment: occasionally    Drug use: Yes    Comment: CBD   Sexual activity: Not on file  Other Topics Concern   Not on file  Social History Narrative   Not on file   Social Determinants of Health   Financial Resource Strain: Not on file  Food Insecurity: No Food Insecurity (11/29/2022)   Hunger Vital Sign    Worried About Running Out of Food in the Last Year: Never true    Ran Out of Food in the Last Year: Never true  Transportation Needs: Patient Declined (11/29/2022)   PRAPARE - Administrator, Civil Service (Medical): Patient declined    Lack of Transportation (Non-Medical): Patient declined  Physical Activity: Not on file  Stress: Not on file  Social Connections: Not on file   Additional Social History:                         Sleep: Good  Appetite:  Good  Current Medications: Current Facility-Administered Medications  Medication Dose Route Frequency Provider Last Rate Last Admin   acetaminophen (TYLENOL) tablet 650 mg  650 mg Oral Q6H PRN Sunday Corn, NP       albuterol (VENTOLIN HFA) 108 (90 Base) MCG/ACT inhaler  2 puff  2 puff Inhalation Q4H PRN Sarina Ill, DO   2 puff at 12/03/22 0831   alum & mag hydroxide-simeth (MAALOX/MYLANTA) 200-200-20 MG/5ML suspension 30 mL  30 mL Oral Q4H PRN Sunday Corn, NP       diphenhydrAMINE (BENADRYL) capsule 50 mg  50 mg Oral TID PRN Sunday Corn, NP       Or   diphenhydrAMINE (BENADRYL) injection 50 mg  50 mg Intramuscular TID PRN Sunday Corn, NP       haloperidol (HALDOL) tablet 5 mg  5 mg Oral TID PRN Sunday Corn, NP       Or   haloperidol lactate (HALDOL) injection 5 mg  5 mg Intramuscular TID PRN Sunday Corn, NP       hydrOXYzine (ATARAX) tablet 25 mg  25 mg Oral TID PRN Sunday Corn, NP   25 mg at 12/03/22 0830   LORazepam (ATIVAN) tablet 2 mg  2 mg Oral TID PRN Sunday Corn, NP       Or   LORazepam (ATIVAN) injection 2 mg  2 mg Intramuscular TID PRN Sunday Corn, NP        magnesium hydroxide (MILK OF MAGNESIA) suspension 30 mL  30 mL Oral Daily PRN Sunday Corn, NP       mirtazapine (REMERON) tablet 15 mg  15 mg Oral QHS Sarina Ill, DO   15 mg at 12/02/22 2145   OLANZapine (ZYPREXA) tablet 10 mg  10 mg Oral Q6H PRN Sarina Ill, DO       PARoxetine (PAXIL) tablet 20 mg  20 mg Oral Daily Sarina Ill, DO   20 mg at 12/03/22 0831   traZODone (DESYREL) tablet 50 mg  50 mg Oral QHS PRN Sunday Corn, NP   50 mg at 12/02/22 2145    Lab Results: No results found for this or any previous visit (from the past 48 hour(s)).  Blood Alcohol level:  Lab Results  Component Value Date   ETH <10 11/29/2022   ETH <10 01/05/2022    Metabolic Disorder Labs: Lab Results  Component Value Date   HGBA1C 4.6 (L) 11/29/2022   MPG 85.32 11/29/2022   MPG 73.84 01/05/2022   Lab Results  Component Value Date   PROLACTIN 5.2 11/29/2022   PROLACTIN 8.1 09/11/2016   Lab Results  Component Value Date   CHOL 201 (H) 11/29/2022   TRIG 69 11/29/2022   HDL 73 11/29/2022   CHOLHDL 2.8 11/29/2022   VLDL 14 11/29/2022   LDLCALC 114 (H) 11/29/2022   LDLCALC 98 01/05/2022    Physical Findings: AIMS:  , ,  ,  ,    CIWA:    COWS:     Musculoskeletal: Strength & Muscle Tone: within normal limits Gait & Station: normal Patient leans: N/A  Psychiatric Specialty Exam:  Presentation  General Appearance:  Appropriate for Environment  Eye Contact: Fair  Speech: Clear and Coherent; Normal Rate  Speech Volume: Decreased  Handedness: Right   Mood and Affect  Mood: Euthymic  Affect: Flat   Thought Process  Thought Processes: Disorganized  Descriptions of Associations:Tangential  Orientation:Full (Time, Place and Person)  Thought Content:Paranoid Ideation; Tangential; Illogical; Delusions (Ideas of reference)  History of Schizophrenia/Schizoaffective disorder:Yes  Duration of Psychotic Symptoms:Greater  than six months  Hallucinations:No data recorded Ideas of Reference:Paranoia; Delusions  Suicidal Thoughts:No data recorded Homicidal Thoughts:No data recorded  Sensorium  Memory: Immediate Fair; Recent  Fair; Remote Fair  Judgment: Poor  Insight: Lacking   Chartered certified accountant: Fair  Attention Span: Fair  Recall: [employer] of Knowledge: Fair  Language: Good   Psychomotor Activity  Psychomotor Activity:No data recorded  Assets  Assets: Communication Skills; Desire for Improvement; Financial Resources/Insurance; Physical Health; Resilience   Sleep  Sleep:No data recorded    Blood pressure 100/64, pulse 61, temperature 97.8 F (36.6 C), temperature source Oral, resp. rate 18, height 5\' 11"  (1.803 m), weight 65.8 kg, SpO2 99%. Body mass index is 20.23 kg/m.   Treatment Plan Summary: Daily contact with patient to assess and evaluate symptoms and progress in treatment, Medication management, and Plan continue current medications.  Encourage antipsychotic of a non-atypical nature  Sarina Ill, DO 12/03/2022, 11:08 AM

## 2022-12-03 NOTE — Group Note (Signed)
[facility] LCSW Group Therapy Note    Group Date: 12/03/2022 Start Time: 1330 End Time: 1430  Type of Therapy and Topic:  Group Therapy:  Overcoming Obstacles  Participation Level:  [facility] PARTICIPATION LEVEL: Did Not Attend  Mood:  Description of Group:   In this group patients will be encouraged to explore what they see as obstacles to their own wellness and recovery. They will be guided to discuss their thoughts, feelings, and behaviors related to these obstacles. The group will process together ways to cope with barriers, with attention given to specific choices patients can make. Each patient will be challenged to identify changes they are motivated to make in order to overcome their obstacles. This group will be process-oriented, with patients participating in exploration of their own experiences as well as giving and receiving support and challenge from other group members.  Therapeutic Goals: 1. Patient will identify personal and current obstacles as they relate to admission. 2. Patient will identify barriers that currently interfere with their wellness or overcoming obstacles.  3. Patient will identify feelings, thought process and behaviors related to these barriers. 4. Patient will identify two changes they are willing to make to overcome these obstacles:    Summary of Patient Progress   X   Therapeutic Modalities:   Cognitive Behavioral Therapy Solution Focused Therapy Motivational Interviewing Relapse Prevention Therapy   Harden Mo, LCSW

## 2022-12-03 NOTE — Progress Notes (Signed)
   12/03/22 2000  Psych Admission Type (Psych Patients Only)  Admission Status Involuntary  Psychosocial Assessment  Patient Complaints Anxiety  Eye Contact Brief  Facial Expression Flat  Affect Appropriate to circumstance  Speech Slow  Interaction Assertive  Motor Activity Slow  Appearance/Hygiene Unremarkable  Behavior Characteristics Cooperative;Appropriate to situation  Mood Preoccupied  Thought Process  Coherency Disorganized  Content Preoccupation  Delusions Paranoid  Perception Hallucinations  Hallucination Auditory  Judgment Impaired  Confusion None  Danger to Self  Current suicidal ideation? Denies  Agreement Not to Harm Self Yes  Description of Agreement verbal  Danger to Others  Danger to Others None reported or observed

## 2022-12-03 NOTE — Group Note (Signed)
Recreation Therapy Group Note   Group Topic:Coping Skills  Group Date: 12/03/2022 Start Time: 1000 End Time: 1100 Facilitators: Rosina Lowenstein, LRT, CTRS Location: Craft Room  Group Description: Mind Map.  Patient was provided a blank template of a diagram with 32 blank boxes in a tiered system, branching from the center (similar to a bubble chart). LRT directed patients to label the middle of the diagram "Coping Skills". LRT and patients then came up with 8 different coping skills as examples. Pt were directed to record their coping skills in the 2nd tier boxes closest to the center.  Patients would then share their coping skills with the group as LRT wrote them out. LRT gave a handout of 99 different coping skills at the end of group.    Goal Area(s) Addressed: Patients will be able to define "coping skills". Patient will identify new coping skills.  Patient will increase communication.   Affect/Mood: N/A   Participation Level: Did not attend    Clinical Observations/Individualized Feedback: Shane Small did not attend group.  Plan: Continue to engage patient in RT group sessions 2-3x/week.   Rosina Lowenstein, LRT, CTRS 12/03/2022 11:38 AM

## 2022-12-03 NOTE — Progress Notes (Signed)
   12/03/22 0555  15 Minute Checks  Location Bedroom  Visual Appearance Calm  Behavior Sleeping  Sleep (Behavioral Health Patients Only)  Calculate sleep? (Click Yes once per 24 hr at 0600 safety check) Yes  Documented sleep last 24 hours 14.25

## 2022-12-03 NOTE — Group Note (Signed)
Date:  12/03/2022 Time:  11:04 PM  Group Topic/Focus:  Developing a Wellness Toolbox:   The focus of this group is to help patients develop a "wellness toolbox" with skills and strategies to promote recovery upon discharge.    Participation Level:  Did Not Attend   Lenore Cordia 12/03/2022, 11:04 PM

## 2022-12-04 MED ORDER — HYDROXYZINE HCL 50 MG PO TABS
50.0000 mg | ORAL_TABLET | Freq: Three times a day (TID) | ORAL | Status: DC | PRN
  Administered 2022-12-04 – 2022-12-07 (×4): 50 mg via ORAL
  Filled 2022-12-04 (×4): qty 1

## 2022-12-04 NOTE — Group Note (Signed)
Recreation Therapy Group Note   Group Topic:Self-Esteem  Group Date: 12/04/2022 Start Time: 1005 End Time: 1105 Facilitators: Rosina Lowenstein, LRT, CTRS Location:  Craft Room   Group Description: My strengths and Qualities. Patients and LRT discussed the importance of self-love/self-esteem and things that cause it to fluctuate. Pt completed a worksheet that helps them identify 24 different strengths and qualities about themselves. Pt encouraged to read aloud at least 3 off their sheet to the group. LRT and pts discussed how this can be applied to daily life post-discharge.  Pt's then played "Positive Affirmation Bingo" afterwards, with stress balls as prizes.   Goal Area(s) Addressed: Patient will identify positive qualities about themselves. Patient will learn new positive affirmations.  Patient will recite positive qualities and affirmations aloud to the group.  Patient will increase communication.  Affect/Mood: N/A   Participation Level: Did not attend    Clinical Observations/Individualized Feedback: Shane Small did not attend group.  Plan: Continue to engage patient in RT group sessions 2-3x/week.   Rosina Lowenstein, LRT, CTRS 12/04/2022 11:58 AM

## 2022-12-04 NOTE — Progress Notes (Signed)
[facility] MD Progress Note  12/04/2022 4:40 PM Shane Small  MRN:  [id number]  Subjective:  Pt chart reviewed and seen on rounds. Has not been attending groups. Endorses euthymic mood. Reports he has been finding the hydroxyzine helpful for anxiety, is asking for it to be increased. Denies suicidal, homicidal ideations. Denies current auditory visual hallucinations. Reports chronic auditory visual hallucinations for the past 4 to 5 years. Reports auditory hallucinations typically "give me directions and stuff like that". Reports visual hallucinations typically are of "crazy stuff like in the magazines". He reports auditory visual hallucinations worsen with poor sleep. Endorses fair sleep and appetite. Discussed starting antipsychotic with pt. He does not want to start an antipsychotic. Reports he has been on zyprexa, risperdal, and abilify and did not like them. States they made him feel like a "zombie" and made his "bones hurt". There is no evidence of agitation, aggression, distractibility, or internal preoccupation. No delusions or paranoia elicited.   Principal Problem: Paranoid schizophrenia (HCC) Diagnosis: Principal Problem:   Paranoid schizophrenia (HCC)  Total Time spent with patient:  25 minutes  Past Psychiatric History: Patient reports a past psychiatric hospitalization at [facility] although he is unsure when this was. Per chart review, patient was admitted to [facility] in May 2018 and has also presented to [facility], [facility], and [facility] multiple times from 2018-2023 for mental health concerns.   Past Medical History:  Past Medical History:  Diagnosis Date   Anxiety    Asthma    Daily headache    Depression    Paranoid schizophrenia (HCC)    Schizophrenia (HCC)    Stroke (HCC)     Past Surgical History:  Procedure Laterality Date   NO PAST SURGERIES     Family History:  Family History  Problem Relation Age of Onset   Asthma Mother    Diabetes Father    Hypertension Father    Colon  cancer Father 8   Family Psychiatric  History: None reported Social History:  Social History   Substance and Sexual Activity  Alcohol Use Yes   Alcohol/week: 56.0 standard drinks of alcohol   Types: 28 Cans of beer, 28 Shots of liquor per week   Comment: occasionally     Social History   Substance and Sexual Activity  Drug Use Yes   Comment: CBD    Social History   Socioeconomic History   Marital status: Single    Spouse name: Not on file   Number of children: 1   Years of education: Not on file   Highest education level: Not on file  Occupational History   Occupation: Location manager  Tobacco Use   Smoking status: Every Day    Types: Cigarettes   Smokeless tobacco: Never  Vaping Use   Vaping status: Former  Substance and Sexual Activity   Alcohol use: Yes    Alcohol/week: 56.0 standard drinks of alcohol    Types: 28 Cans of beer, 28 Shots of liquor per week    Comment: occasionally   Drug use: Yes    Comment: CBD   Sexual activity: Not on file  Other Topics Concern   Not on file  Social History Narrative   Not on file   Social Determinants of Health   Financial Resource Strain: Not on file  Food Insecurity: No Food Insecurity (11/29/2022)   Hunger Vital Sign    Worried About Running Out of Food in the Last Year: Never true    Ran Out  of Food in the Last Year: Never true  Transportation Needs: Patient Declined (11/29/2022)   PRAPARE - Administrator, Civil Service (Medical): Patient declined    Lack of Transportation (Non-Medical): Patient declined  Physical Activity: Not on file  Stress: Not on file  Social Connections: Not on file   Additional Social History:                         Sleep: Fair  Appetite:  Fair  Current Medications: Current Facility-Administered Medications  Medication Dose Route Frequency Provider Last Rate Last Admin   acetaminophen (TYLENOL) tablet 650 mg  650 mg Oral Q6H PRN Sunday Corn, NP        albuterol (VENTOLIN HFA) 108 (90 Base) MCG/ACT inhaler 2 puff  2 puff Inhalation Q4H PRN Sarina Ill, DO   2 puff at 12/03/22 2102   alum & mag hydroxide-simeth (MAALOX/MYLANTA) 200-200-20 MG/5ML suspension 30 mL  30 mL Oral Q4H PRN Sunday Corn, NP       diphenhydrAMINE (BENADRYL) capsule 50 mg  50 mg Oral TID PRN Sunday Corn, NP       Or   diphenhydrAMINE (BENADRYL) injection 50 mg  50 mg Intramuscular TID PRN Sunday Corn, NP       haloperidol (HALDOL) tablet 5 mg  5 mg Oral TID PRN Sunday Corn, NP       Or   haloperidol lactate (HALDOL) injection 5 mg  5 mg Intramuscular TID PRN Sunday Corn, NP       hydrOXYzine (ATARAX) tablet 50 mg  50 mg Oral TID PRN Lauree Chandler, NP   50 mg at 12/04/22 1331   magnesium hydroxide (MILK OF MAGNESIA) suspension 30 mL  30 mL Oral Daily PRN Sunday Corn, NP       mirtazapine (REMERON) tablet 15 mg  15 mg Oral QHS Sarina Ill, DO   15 mg at 12/03/22 2101   OLANZapine (ZYPREXA) tablet 10 mg  10 mg Oral Q6H PRN Sarina Ill, DO       PARoxetine (PAXIL) tablet 20 mg  20 mg Oral Daily Sarina Ill, DO   20 mg at 12/04/22 0945   traZODone (DESYREL) tablet 50 mg  50 mg Oral QHS PRN Sunday Corn, NP   50 mg at 12/02/22 2145    Lab Results: No results found for this or any previous visit (from the past 48 hour(s)).  Blood Alcohol level:  Lab Results  Component Value Date   ETH <10 11/29/2022   ETH <10 01/05/2022    Metabolic Disorder Labs: Lab Results  Component Value Date   HGBA1C 4.6 (L) 11/29/2022   MPG 85.32 11/29/2022   MPG 73.84 01/05/2022   Lab Results  Component Value Date   PROLACTIN 5.2 11/29/2022   PROLACTIN 8.1 09/11/2016   Lab Results  Component Value Date   CHOL 201 (H) 11/29/2022   TRIG 69 11/29/2022   HDL 73 11/29/2022   CHOLHDL 2.8 11/29/2022   VLDL 14 11/29/2022   LDLCALC 114 (H) 11/29/2022   LDLCALC 98 01/05/2022    Physical  Findings: AIMS:  , ,  ,  ,    CIWA:    COWS:     Musculoskeletal: Strength & Muscle Tone: within normal limits Gait & Station: normal Patient leans: N/A  Psychiatric Specialty Exam:  Presentation  General Appearance:  Appropriate for Environment  Eye Contact:  Fair  Speech: Clear and Coherent; Normal Rate  Speech Volume: Normal  Handedness: Right   Mood and Affect  Mood: Euthymic  Affect: Flat   Thought Process  Thought Processes: Coherent; Goal Directed; Linear  Descriptions of Associations:Intact  Orientation:Full (Time, Place and Person)  Thought Content:WDL  History of Schizophrenia/Schizoaffective disorder:Yes  Duration of Psychotic Symptoms:Greater than six months  Hallucinations:Hallucinations: None  Ideas of Reference:None  Suicidal Thoughts:Suicidal Thoughts: No  Homicidal Thoughts:Homicidal Thoughts: No   Sensorium  Memory: Immediate Fair; Recent Fair; Remote Fair  Judgment: Intact  Insight: Shallow   Executive Functions  Concentration: Fair  Attention Span: Fair  Recall: Fair  Fund of Knowledge: Fair  Language: Fair   Psychomotor Activity  Psychomotor Activity:Psychomotor Activity: Normal   Assets  Assets: Communication Skills; Desire for Improvement; Financial Resources/Insurance; Physical Health; Resilience; Social Support   Sleep  Sleep:Sleep: Fair    Physical Exam: Physical Exam Constitutional:      General: He is not in acute distress.    Appearance: He is not ill-appearing, toxic-appearing or diaphoretic.  Cardiovascular:     Rate and Rhythm: Normal rate.  Pulmonary:     Effort: Pulmonary effort is normal. No respiratory distress.  Neurological:     Mental Status: He is alert and oriented to person, place, and time.  Psychiatric:        Attention and Perception: Attention and perception normal.        Mood and Affect: Mood normal. Affect is flat.        Speech: Speech normal.         Behavior: Behavior normal. Behavior is cooperative.        Thought Content: Thought content normal.    Review of Systems  Constitutional:  Negative for chills and fever.  Respiratory:  Negative for shortness of breath.   Cardiovascular:  Negative for chest pain and palpitations.  Gastrointestinal:  Negative for abdominal pain.  Neurological:  Negative for headaches.   Blood pressure 100/64, pulse 61, temperature 97.8 F (36.6 C), temperature source Oral, resp. rate 18, height 5\' 11"  (1.803 m), weight 65.8 kg, SpO2 99%. Body mass index is 20.23 kg/m.   Treatment Plan Summary: Daily contact with patient to assess and evaluate symptoms and progress in treatment, Medication management, and Plan increase hydroxyzine from 25mg  tid prn to 50mg  tid prn. Continue current medications.  Lauree Chandler, NP 12/04/2022, 4:40 PM

## 2022-12-04 NOTE — Group Note (Signed)
[facility] LCSW Group Therapy Note   Group Date: 12/04/2022 Start Time: 1311 End Time: 1415  Type of Therapy/Topic:  Group Therapy:  Feelings about Diagnosis  Participation Level:  Did Not Attend    Description of Group:    This group will allow patients to explore their thoughts and feelings about diagnoses they have received. Patients will be guided to explore their level of understanding and acceptance of these diagnoses. Facilitator will encourage patients to process their thoughts and feelings about the reactions of others to their diagnosis, and will guide patients in identifying ways to discuss their diagnosis with significant others in their lives. This group will be process-oriented, with patients participating in exploration of their own experiences as well as giving and receiving support and challenge from other group members.   Therapeutic Goals: 1. Patient will demonstrate understanding of diagnosis as evidence by identifying two or more symptoms of the disorder:  2. Patient will be able to express two feelings regarding the diagnosis 3. Patient will demonstrate ability to communicate their needs through discussion and/or role plays  Summary of Patient Progress: X   Therapeutic Modalities:   Cognitive Behavioral Therapy Brief Therapy Feelings Identification    Glenis Smoker, LCSW

## 2022-12-04 NOTE — Group Note (Signed)
Date:  12/04/2022 Time:  2:39 PM  Group Topic/Focus:  Goals Group:   The focus of this group is to help patients establish daily goals to achieve during treatment and discuss how the patient can incorporate goal setting into their daily lives to aide in recovery.    Participation Level:  Did Not Attend    Lynelle Smoke [facility] 12/04/2022, 2:39 PM

## 2022-12-04 NOTE — Group Note (Signed)
Date:  12/04/2022 Time:  6:41 PM  Group Topic/Focus:  ACTIVITY GROUP AND WELLNESS SUPPORT    Participation Level:  Did Not Attend   Shane Small 12/04/2022, 6:41 PM

## 2022-12-04 NOTE — Progress Notes (Signed)
D- Patient alert and oriented x 2-3. Affect anxious/mood congruent. Denies SI/ HI/ AVH. Patient denies pain. Patient endorses depression and anxiety. PRN Hydroxyzine 25 mg administered po for c/o. He refuses Antipsychotic class of medications. He was instructed on medications- actions, doses and adverse reactions. A- Scheduled and PRN medications administered to patient, per MD orders. Support and encouragement provided.  Routine safety checks conducted every 15 minutes without incident.  Patient informed to notify staff with problems or concerns and verbalizes understanding. R- No adverse drug reactions noted.  Patient compliant with medications but refuses antipsychotic medications. Patient isolative to his room except for meals.  Patient contracts for safety and  remains safe on the unit at this time.

## 2022-12-04 NOTE — Group Note (Signed)
Date:  12/04/2022 Time:  10:14 PM  Group Topic/Focus:  Developing a Wellness Toolbox:   The focus of this group is to help patients develop a "wellness toolbox" with skills and strategies to promote recovery upon discharge.    Participation Level:  Did Not Attend   Lenore Cordia 12/04/2022, 10:14 PM

## 2022-12-05 NOTE — Group Note (Signed)
Recreation Therapy Group Note   Group Topic:Goal Setting  Group Date: 12/05/2022 Start Time: 1015 End Time: 1115 Facilitators: Rosina Lowenstein, LRT, CTRS Location:  Craft Room  Group description: Now Future Wall. Patients were given a sheet of paper and asked to fold it into 3 sections, like a pamphlet. Top section, patients were encouraged to write what they are feeling or experiencing "now". The bottom section, patients were asked to fill out how they want to feel or things they want to experience in the "future".  In the middle section, patients were encouraged to fill out any "walls" or barriers that are getting in the way of them reaching their "future". On the back of the sheet, patients were encouraged to write positive coping skills that will help them get over or though the walls they experience. LRT and patients discussed each of the sections and shared them aloud in group. Patients are encouraged to keep this paper with them as a guide/plan post discharge.   Goal Area(s) Addressed:  Patients will identify walls/triggers. Patients will identify and list coping skills to use post-discharge. Patients will work on goal setting, short or long-term. Patients will work on communication by reading aloud to group and engaging in post activity discussion.   Affect/Mood: N/A   Participation Level: Did not attend    Clinical Observations/Individualized Feedback: Shane Small did not attend group.  Plan: Continue to engage patient in RT group sessions 2-3x/week.   Rosina Lowenstein, LRT, CTRS 12/05/2022 12:04 PM

## 2022-12-05 NOTE — BH IP Treatment Plan (Signed)
Interdisciplinary Treatment and Diagnostic Plan Update  12/05/2022 Time of Session: 9:00 AM  Shane Small MRN: [id number]  Principal Diagnosis: Paranoid schizophrenia (HCC)  Secondary Diagnoses: Principal Problem:   Paranoid schizophrenia (HCC)   Current Medications:  Current Facility-Administered Medications  Medication Dose Route Frequency Provider Last Rate Last Admin   acetaminophen (TYLENOL) tablet 650 mg  650 mg Oral Q6H PRN Sunday Corn, NP       albuterol (VENTOLIN HFA) 108 (90 Base) MCG/ACT inhaler 2 puff  2 puff Inhalation Q4H PRN Sarina Ill, DO   2 puff at 12/04/22 2111   alum & mag hydroxide-simeth (MAALOX/MYLANTA) 200-200-20 MG/5ML suspension 30 mL  30 mL Oral Q4H PRN Sunday Corn, NP       diphenhydrAMINE (BENADRYL) capsule 50 mg  50 mg Oral TID PRN Sunday Corn, NP       Or   diphenhydrAMINE (BENADRYL) injection 50 mg  50 mg Intramuscular TID PRN Sunday Corn, NP       haloperidol (HALDOL) tablet 5 mg  5 mg Oral TID PRN Sunday Corn, NP       Or   haloperidol lactate (HALDOL) injection 5 mg  5 mg Intramuscular TID PRN Sunday Corn, NP       hydrOXYzine (ATARAX) tablet 50 mg  50 mg Oral TID PRN Lauree Chandler, NP   50 mg at 12/04/22 2111   magnesium hydroxide (MILK OF MAGNESIA) suspension 30 mL  30 mL Oral Daily PRN Sunday Corn, NP       mirtazapine (REMERON) tablet 15 mg  15 mg Oral QHS Sarina Ill, DO   15 mg at 12/04/22 2132   OLANZapine (ZYPREXA) tablet 10 mg  10 mg Oral Q6H PRN Sarina Ill, DO       PARoxetine (PAXIL) tablet 20 mg  20 mg Oral Daily Sarina Ill, DO   20 mg at 12/05/22 5643   traZODone (DESYREL) tablet 50 mg  50 mg Oral QHS PRN Sunday Corn, NP   50 mg at 12/04/22 2111   PTA Medications: Medications Prior to Admission  Medication Sig Dispense Refill Last Dose   diphenhydrAMINE (BENADRYL) 25 mg capsule Take 25 mg by mouth every 8 (eight) hours as needed  for allergies or sleep.      Multiple Vitamin (MULTIVITAMIN WITH MINERALS) TABS tablet Take 1 tablet by mouth daily.       Patient Stressors: Medication change or noncompliance   Substance abuse    Patient Strengths: Motivation for treatment/growth  Supportive family/friends   Treatment Modalities: Medication Management, Group therapy, Case management,  1 to 1 session with clinician, Psychoeducation, Recreational therapy.   Physician Treatment Plan for Primary Diagnosis: Paranoid schizophrenia (HCC) Long Term Goal(s): Improvement in symptoms so as ready for discharge   Short Term Goals: Ability to identify changes in lifestyle to reduce recurrence of condition will improve  Medication Management: Evaluate patient's response, side effects, and tolerance of medication regimen.  Therapeutic Interventions: 1 to 1 sessions, Unit Group sessions and Medication administration.  Evaluation of Outcomes: Progressing  Physician Treatment Plan for Secondary Diagnosis: Principal Problem:   Paranoid schizophrenia (HCC)  Long Term Goal(s): Improvement in symptoms so as ready for discharge   Short Term Goals: Ability to identify changes in lifestyle to reduce recurrence of condition will improve     Medication Management: Evaluate patient's response, side effects, and tolerance of medication regimen.  Therapeutic Interventions: 1 to 1 sessions,  Unit Group sessions and Medication administration.  Evaluation of Outcomes: Progressing   RN Treatment Plan for Primary Diagnosis: Paranoid schizophrenia (HCC) Long Term Goal(s): Knowledge of disease and therapeutic regimen to maintain health will improve  Short Term Goals: Ability to remain free from injury will improve, Ability to verbalize frustration and anger appropriately will improve, Ability to demonstrate self-control, Ability to participate in decision making will improve, Ability to verbalize feelings will improve, Ability to disclose and  discuss suicidal ideas, Ability to identify and develop effective coping behaviors will improve, and Compliance with prescribed medications will improve  Medication Management: RN will administer medications as ordered by provider, will assess and evaluate patient's response and provide education to patient for prescribed medication. RN will report any adverse and/or side effects to prescribing provider.  Therapeutic Interventions: 1 on 1 counseling sessions, Psychoeducation, Medication administration, Evaluate responses to treatment, Monitor vital signs and CBGs as ordered, Perform/monitor CIWA, COWS, AIMS and Fall Risk screenings as ordered, Perform wound care treatments as ordered.  Evaluation of Outcomes: Progressing   LCSW Treatment Plan for Primary Diagnosis: Paranoid schizophrenia (HCC) Long Term Goal(s): Safe transition to appropriate next level of care at discharge, Engage patient in therapeutic group addressing interpersonal concerns.  Short Term Goals: Engage patient in aftercare planning with referrals and resources, Increase social support, Increase ability to appropriately verbalize feelings, Increase emotional regulation, Facilitate acceptance of mental health diagnosis and concerns, Identify triggers associated with mental health/substance abuse issues, and Increase skills for wellness and recovery  Therapeutic Interventions: Assess for all discharge needs, 1 to 1 time with Social worker, Explore available resources and support systems, Assess for adequacy in community support network, Educate family and significant other(s) on suicide prevention, Complete Psychosocial Assessment, Interpersonal group therapy.  Evaluation of Outcomes: Progressing  Progress in Treatment: Attending groups: No. Participating in groups: No. Taking medication as prescribed: Yes. Toleration medication: Yes. Family/Significant other contact made: No, pt declined Patient understands diagnosis:  Yes. Discussing patient identified problems/goals with staff: Yes. Medical problems stabilized or resolved: Yes. Denies suicidal/homicidal ideation: Yes. Issues/concerns per patient self-inventory: No. Other: None    New problem(s) identified: No, Describe:  None Update 12/05/22: No changes at this time    New Short Term/Long Term Goal(s):  elimination of symptoms of psychosis, medication management for mood stabilization; elimination of SI thoughts; development of comprehensive mental wellness plan.  Update 12/05/22: No changes at this time    Patient Goals:  "I don't know, I know I have a lot of stress going on and a lot of anxiety, depression"  Update 12/05/22: No changes at this time    Discharge Plan or Barriers: CSW will assist with appropriate discharge planning  Update 12/05/22: No changes at this time    Reason for Continuation of Hospitalization: Anxiety Depression Medication stabilization   Estimated Length of Stay: 1 to 7 days  Update 12/05/22: No changes at this time     Last 3 [place] Suicide Severity Risk Score: Flowsheet Row Admission (Current) from 11/29/2022 in [facility] INPAT[facility]AL [facility]INE Most recent reading at 11/29/2022  7:32 PM ED from 11/29/2022 in [facility] Most recent reading at 11/29/2022  2:52 PM ED from 08/22/2022 in [facility] Emergency Department at [facility] Most recent reading at 08/22/2022  7:32 AM  C-SSRS RISK CATEGORY No Risk No Risk No Risk       Last PHQ 2/9 Scores:    01/07/2022   10:48 AM 04/08/2020   10:14 AM  Depression screen PHQ 2/9  Decreased Interest 0 2  Down, Depressed, Hopeless 1 1  PHQ - 2 Score 1 3  Altered sleeping 1 1  Tired, decreased energy 0 2  Change in appetite 0 2  Feeling bad or failure about yourself  1 1  Trouble concentrating 1 3  Moving slowly or fidgety/restless 0 1  Suicidal thoughts 0 0  PHQ-9 Score 4 13  Difficult doing work/chores Somewhat difficult Somewhat difficult     Scribe for Treatment Team: Laretta Alstrom 12/05/2022 4:34 PM

## 2022-12-05 NOTE — Plan of Care (Signed)
  Problem: Safety: Goal: Ability to remain free from injury will improve Outcome: Progressing   

## 2022-12-05 NOTE — Plan of Care (Signed)
D- Patient alert and oriented. Pt continues to isolate much of the day. Denies SI, HI, AVH, and pain.  A- Scheduled medications administered to patient, per MD orders. Support and encouragement provided.  Routine safety checks conducted every 15 minutes.  Patient informed to notify staff with problems or concerns. R- No adverse drug reactions noted. Patient contracts for safety at this time. Patient compliant with medications and treatment plan. Patient receptive, calm, and cooperative. Patient interacts well with others on the unit.  Patient remains safe at this time.

## 2022-12-05 NOTE — Group Note (Signed)
[facility] LCSW Group Therapy Note   Group Date: 12/05/2022 Start Time: 1300 End Time: 1400   Type of Therapy/Topic:  Group Therapy:  Emotion Regulation  Participation Level:  Did Not Attend   Mood:  Description of Group:    The purpose of this group is to assist patients in learning to regulate negative emotions and experience positive emotions. Patients will be guided to discuss ways in which they have been vulnerable to their negative emotions. These vulnerabilities will be juxtaposed with experiences of positive emotions or situations, and patients challenged to use positive emotions to combat negative ones. Special emphasis will be placed on coping with negative emotions in conflict situations, and patients will process healthy conflict resolution skills.  Therapeutic Goals: Patient will identify two positive emotions or experiences to reflect on in order to balance out negative emotions:  Patient will label two or more emotions that they find the most difficult to experience:  Patient will be able to demonstrate positive conflict resolution skills through discussion or role plays:   Summary of Patient Progress:   X    Therapeutic Modalities:   Cognitive Behavioral Therapy Feelings Identification Dialectical Behavioral Therapy   Harden Mo, LCSW

## 2022-12-05 NOTE — Progress Notes (Signed)
Assumed care at 0100. Patient asleep. Did not wake up throughout shift.

## 2022-12-05 NOTE — Group Note (Signed)
Date:  12/05/2022 Time:  4:09 PM  Group Topic/Focus:  Self Esteem Action Plan:   The focus of this group is to help patients create a plan to continue to build self-esteem after discharge.    Participation Level:  Did Not Attend   Shane Small 12/05/2022, 4:09 PM

## 2022-12-05 NOTE — Progress Notes (Signed)
[facility] MD Progress Note  12/05/2022 5:12 PM Shane Small  MRN:  [id number] Subjective:  Pt chart reviewed and seen on rounds. Endorses euthymic mood. Denies suicidal, homicidal ideations. Endorses last experiencing visual hallucinations yesterday of "an illusion, figure in the room". Discussed starting seroquel. Pt states he does not want to try zyprexa, risperdal or abilify. He asks for medication information on seroquel. He also wants informational sheets on hydroxyzine, mirtazapine, and paroxetine. These were provided to him. States he will read them over and consider it.  Principal Problem: Paranoid schizophrenia (HCC) Diagnosis: Principal Problem:   Paranoid schizophrenia (HCC)  Total Time spent with patient: 30 minutes  Past Psychiatric History: Patient reports a past psychiatric hospitalization at [facility] although he is unsure when this was. Per chart review, patient was admitted to [facility] [place] in May 2018 and has also presented to [facility], [facility], and [facility] multiple times from 2018-2023 for mental health concerns.   Past Medical History:  Past Medical History:  Diagnosis Date   Anxiety    Asthma    Daily headache    Depression    Paranoid schizophrenia (HCC)    Schizophrenia (HCC)    Stroke (HCC)     Past Surgical History:  Procedure Laterality Date   NO PAST SURGERIES     Family History:  Family History  Problem Relation Age of Onset   Asthma Mother    Diabetes Father    Hypertension Father    Colon cancer Father 36   Family Psychiatric  History: None reported Social History:  Social History   Substance and Sexual Activity  Alcohol Use Yes   Alcohol/week: 56.0 standard drinks of alcohol   Types: 28 Cans of beer, 28 Shots of liquor per week   Comment: occasionally     Social History   Substance and Sexual Activity  Drug Use Yes   Comment: CBD    Social History   Socioeconomic History   Marital status: Single    Spouse name: Not on file   Number of children:  1   Years of education: Not on file   Highest education level: Not on file  Occupational History   Occupation: Location manager  Tobacco Use   Smoking status: Every Day    Types: Cigarettes   Smokeless tobacco: Never  Vaping Use   Vaping status: Former  Substance and Sexual Activity   Alcohol use: Yes    Alcohol/week: 56.0 standard drinks of alcohol    Types: 28 Cans of beer, 28 Shots of liquor per week    Comment: occasionally   Drug use: Yes    Comment: CBD   Sexual activity: Not on file  Other Topics Concern   Not on file  Social History Narrative   Not on file   Social Determinants of Health   Financial Resource Strain: Not on file  Food Insecurity: No Food Insecurity (11/29/2022)   Hunger Vital Sign    Worried About Running Out of Food in the Last Year: Never true    Ran Out of Food in the Last Year: Never true  Transportation Needs: Patient Declined (11/29/2022)   PRAPARE - Administrator, Civil Service (Medical): Patient declined    Lack of Transportation (Non-Medical): Patient declined  Physical Activity: Not on file  Stress: Not on file  Social Connections: Not on file   Additional Social History:  Sleep: Fair  Appetite:  Fair  Current Medications: Current Facility-Administered Medications  Medication Dose Route Frequency Provider Last Rate Last Admin   acetaminophen (TYLENOL) tablet 650 mg  650 mg Oral Q6H PRN Sunday Corn, NP       albuterol (VENTOLIN HFA) 108 (90 Base) MCG/ACT inhaler 2 puff  2 puff Inhalation Q4H PRN Sarina Ill, DO   2 puff at 12/04/22 2111   alum & mag hydroxide-simeth (MAALOX/MYLANTA) 200-200-20 MG/5ML suspension 30 mL  30 mL Oral Q4H PRN Sunday Corn, NP       diphenhydrAMINE (BENADRYL) capsule 50 mg  50 mg Oral TID PRN Sunday Corn, NP       Or   diphenhydrAMINE (BENADRYL) injection 50 mg  50 mg Intramuscular TID PRN Sunday Corn, NP       haloperidol  (HALDOL) tablet 5 mg  5 mg Oral TID PRN Sunday Corn, NP       Or   haloperidol lactate (HALDOL) injection 5 mg  5 mg Intramuscular TID PRN Sunday Corn, NP       hydrOXYzine (ATARAX) tablet 50 mg  50 mg Oral TID PRN Lauree Chandler, NP   50 mg at 12/04/22 2111   magnesium hydroxide (MILK OF MAGNESIA) suspension 30 mL  30 mL Oral Daily PRN Sunday Corn, NP       mirtazapine (REMERON) tablet 15 mg  15 mg Oral QHS Sarina Ill, DO   15 mg at 12/04/22 2132   OLANZapine (ZYPREXA) tablet 10 mg  10 mg Oral Q6H PRN Sarina Ill, DO       PARoxetine (PAXIL) tablet 20 mg  20 mg Oral Daily Sarina Ill, DO   20 mg at 12/05/22 6295   traZODone (DESYREL) tablet 50 mg  50 mg Oral QHS PRN Sunday Corn, NP   50 mg at 12/04/22 2111    Lab Results: No results found for this or any previous visit (from the past 48 hour(s)).  Blood Alcohol level:  Lab Results  Component Value Date   ETH <10 11/29/2022   ETH <10 01/05/2022    Metabolic Disorder Labs: Lab Results  Component Value Date   HGBA1C 4.6 (L) 11/29/2022   MPG 85.32 11/29/2022   MPG 73.84 01/05/2022   Lab Results  Component Value Date   PROLACTIN 5.2 11/29/2022   PROLACTIN 8.1 09/11/2016   Lab Results  Component Value Date   CHOL 201 (H) 11/29/2022   TRIG 69 11/29/2022   HDL 73 11/29/2022   CHOLHDL 2.8 11/29/2022   VLDL 14 11/29/2022   LDLCALC 114 (H) 11/29/2022   LDLCALC 98 01/05/2022    Physical Findings: AIMS:  , ,  ,  ,    CIWA:    COWS:     Musculoskeletal: Strength & Muscle Tone: within normal limits Gait & Station: normal Patient leans: N/A  Psychiatric Specialty Exam:  Presentation  General Appearance:  Appropriate for Environment; Fairly Groomed  Eye Contact: Fair  Speech: Clear and Coherent; Normal Rate  Speech Volume: Normal  Handedness: Right   Mood and Affect  Mood: Euthymic  Affect: Flat   Thought Process  Thought  Processes: Coherent; Goal Directed; Linear  Descriptions of Associations:Intact  Orientation:Full (Time, Place and Person)  Thought Content:WDL; Logical  History of Schizophrenia/Schizoaffective disorder:Yes  Duration of Psychotic Symptoms:Greater than six months  Hallucinations:Hallucinations: None  Ideas of Reference:None  Suicidal Thoughts:Suicidal Thoughts: No  Homicidal Thoughts:Homicidal Thoughts: No  Sensorium  Memory: Immediate Fair  Judgment: Intact  Insight: Shallow   Executive Functions  Concentration: Fair  Attention Span: Fair  Recall: [employer] of Knowledge: Fair  Language: Fair   Psychomotor Activity  Psychomotor Activity: Psychomotor Activity: Normal   Assets  Assets: Communication Skills; Desire for Improvement; Financial Resources/Insurance; Physical Health; Resilience; Social Support   Sleep  Sleep: Sleep: Fair    Physical Exam: Physical Exam Constitutional:      General: He is not in acute distress.    Appearance: He is not ill-appearing, toxic-appearing or diaphoretic.  Cardiovascular:     Rate and Rhythm: Normal rate.  Pulmonary:     Effort: Pulmonary effort is normal. No respiratory distress.  Neurological:     Mental Status: He is alert and oriented to person, place, and time.  Psychiatric:        Attention and Perception: Attention and perception normal.        Mood and Affect: Mood and affect normal.        Speech: Speech normal.        Behavior: Behavior normal. Behavior is cooperative.        Thought Content: Thought content normal.        Cognition and Memory: Cognition and memory normal.        Judgment: Judgment normal.    Review of Systems  Constitutional:  Negative for chills and fever.  Respiratory:  Negative for shortness of breath.   Cardiovascular:  Negative for chest pain and palpitations.  Gastrointestinal:  Negative for abdominal pain.  Neurological:  Negative for headaches.   Blood  pressure 107/77, pulse 62, temperature (!) 97.5 F (36.4 C), resp. rate 17, height 5\' 11"  (1.803 m), weight 65.8 kg, SpO2 98%. Body mass index is 20.23 kg/m.   Treatment Plan Summary: Daily contact with patient to assess and evaluate symptoms and progress in treatment, Medication management, and Plan continue current medications.   Lauree Chandler, NP 12/05/2022, 5:12 PM

## 2022-12-05 NOTE — Group Note (Signed)
Date:  12/05/2022 Time:  10:31 AM  Group Topic/Focus:  Structured Activity Group   Participation Level:  Did Not Attend Emonii Wienke A Krishawna Stiefel 12/05/2022, 10:31 AM

## 2022-12-06 MED ORDER — QUETIAPINE FUMARATE 25 MG PO TABS
50.0000 mg | ORAL_TABLET | Freq: Every day | ORAL | Status: DC
  Administered 2022-12-06 – 2022-12-09 (×4): 50 mg via ORAL
  Filled 2022-12-06 (×4): qty 2

## 2022-12-06 NOTE — Group Note (Signed)
Date:  12/06/2022 Time:  11:54 PM  Group Topic/Focus:  Coping With Mental Health Crisis:   The purpose of this group is to help patients identify strategies for coping with mental health crisis.  Group discusses possible causes of crisis and ways to manage them effectively.    Participation Level:  Active  Participation Quality:  Appropriate and Attentive  Affect:  Appropriate and Excited  Cognitive:  Alert and Appropriate  Insight: Appropriate  Engagement in Group:  Developing/Improving and Engaged  Modes of Intervention:  Activity, Discussion, Role-play, Socialization, and Support  Additional Comments:     Tome Wilson 12/06/2022, 11:54 PM

## 2022-12-06 NOTE — Group Note (Signed)
Date:  12/06/2022 Time:  10:08 AM  Group Topic/Focus:  Goals Group:   The focus of this group is to help patients establish daily goals to achieve during treatment and discuss how the patient can incorporate goal setting into their daily lives to aide in recovery.  Community Group   Participation Level:  Did Not Attend   Shelley Pooley A Adekunle Rohrbach 12/06/2022, 10:08 AM

## 2022-12-06 NOTE — Group Note (Unsigned)
Date:  12/06/2022 Time:  11:51 PM  Group Topic/Focus:  Coping With Mental Health Crisis:   The purpose of this group is to help patients identify strategies for coping with mental health crisis.  Group discusses possible causes of crisis and ways to manage them effectively.     Participation Level:  {BHH PARTICIPATION ZOXWR:60454}  Participation Quality:  {BHH PARTICIPATION QUALITY:22265}  Affect:  {BHH AFFECT:22266}  Cognitive:  {BHH COGNITIVE:22267}  Insight: {BHH Insight2:20797}  Engagement in Group:  {BHH ENGAGEMENT IN UJWJX:91478}  Modes of Intervention:  {BHH MODES OF INTERVENTION:22269}  Additional Comments:  ***  Dalasia Predmore 12/06/2022, 11:51 PM

## 2022-12-06 NOTE — Group Note (Unsigned)
Date:  12/06/2022 Time:  11:05 PM  Group Topic/Focus:  Coping With Mental Health Crisis:   The purpose of this group is to help patients identify strategies for coping with mental health crisis.  Group discusses possible causes of crisis and ways to manage them effectively.     Participation Level:  {BHH PARTICIPATION KZSWF:09323}  Participation Quality:  {BHH PARTICIPATION QUALITY:22265}  Affect:  {BHH AFFECT:22266}  Cognitive:  {BHH COGNITIVE:22267}  Insight: {BHH Insight2:20797}  Engagement in Group:  {BHH ENGAGEMENT IN FTDDU:20254}  Modes of Intervention:  {BHH MODES OF INTERVENTION:22269}  Additional Comments:  ***  Shane Small 12/06/2022, 11:05 PM

## 2022-12-06 NOTE — Progress Notes (Signed)
[facility] MD Progress Note  12/06/2022 1:27 PM Shane Small  MRN:  [id number]  Subjective:  Pt chart reviewed, discussed w/ interdisciplinary team, and seen on rounds. Pt reports feeling anxious today. No identifiable trigger. He states otherwise he has had a fair appetite and fair sleep. He denies auditory visual hallucinations. He has on a mask today. When asked reason for the mask, states it makes him feel safer. He also states he felt a peer on the unit was trying to kiss him. When asked what happened, states nothing, was just a sense he got. He states he was able to read through informational sheets provided to him yesterday. He is concerned about FDA black box warning of seroquel. We spent some time discussing his concerns. He is agreeable for seroquel 50mg  at bedtime to be ordered, given he will consider whether or not to take it when offered. He also asked for informational sheet for abilify. This was provided for him. We have discussed potential discharge date of 12/10/22 and he is in agreement.   Principal Problem: Paranoid schizophrenia (HCC) Diagnosis: Principal Problem:   Paranoid schizophrenia (HCC)  Total Time spent with patient: 45 minutes  Past Psychiatric History: Patient reports a past psychiatric hospitalization at [facility] although he is unsure when this was. Per chart review, patient was admitted to [facility] in May 2018 and has also presented to [facility], [facility], and [facility] multiple times from 2018-2023 for mental health concerns.   Past Medical History:  Past Medical History:  Diagnosis Date   Anxiety    Asthma    Daily headache    Depression    Paranoid schizophrenia (HCC)    Schizophrenia (HCC)    Stroke (HCC)     Past Surgical History:  Procedure Laterality Date   NO PAST SURGERIES     Family History:  Family History  Problem Relation Age of Onset   Asthma Mother    Diabetes Father    Hypertension Father    Colon cancer Father 23   Family Psychiatric  History:  None reported Social History:  Social History   Substance and Sexual Activity  Alcohol Use Yes   Alcohol/week: 56.0 standard drinks of alcohol   Types: 28 Cans of beer, 28 Shots of liquor per week   Comment: occasionally     Social History   Substance and Sexual Activity  Drug Use Yes   Comment: CBD    Social History   Socioeconomic History   Marital status: Single    Spouse name: Not on file   Number of children: 1   Years of education: Not on file   Highest education level: Not on file  Occupational History   Occupation: Location manager  Tobacco Use   Smoking status: Every Day    Types: Cigarettes   Smokeless tobacco: Never  Vaping Use   Vaping status: Former  Substance and Sexual Activity   Alcohol use: Yes    Alcohol/week: 56.0 standard drinks of alcohol    Types: 28 Cans of beer, 28 Shots of liquor per week    Comment: occasionally   Drug use: Yes    Comment: CBD   Sexual activity: Not on file  Other Topics Concern   Not on file  Social History Narrative   Not on file   Social Determinants of Health   Financial Resource Strain: Not on file  Food Insecurity: No Food Insecurity (11/29/2022)   Hunger Vital Sign    Worried About Running  Out of Food in the Last Year: Never true    Ran Out of Food in the Last Year: Never true  Transportation Needs: Patient Declined (11/29/2022)   PRAPARE - Administrator, Civil Service (Medical): Patient declined    Lack of Transportation (Non-Medical): Patient declined  Physical Activity: Not on file  Stress: Not on file  Social Connections: Not on file   Additional Social History:                         Sleep: Fair  Appetite:  Fair  Current Medications: Current Facility-Administered Medications  Medication Dose Route Frequency Provider Last Rate Last Admin   acetaminophen (TYLENOL) tablet 650 mg  650 mg Oral Q6H PRN Sunday Corn, NP       albuterol (VENTOLIN HFA) 108 (90 Base) MCG/ACT  inhaler 2 puff  2 puff Inhalation Q4H PRN Sarina Ill, DO   2 puff at 12/05/22 2052   alum & mag hydroxide-simeth (MAALOX/MYLANTA) 200-200-20 MG/5ML suspension 30 mL  30 mL Oral Q4H PRN Sunday Corn, NP       diphenhydrAMINE (BENADRYL) capsule 50 mg  50 mg Oral TID PRN Sunday Corn, NP       Or   diphenhydrAMINE (BENADRYL) injection 50 mg  50 mg Intramuscular TID PRN Sunday Corn, NP       haloperidol (HALDOL) tablet 5 mg  5 mg Oral TID PRN Sunday Corn, NP       Or   haloperidol lactate (HALDOL) injection 5 mg  5 mg Intramuscular TID PRN Sunday Corn, NP       hydrOXYzine (ATARAX) tablet 50 mg  50 mg Oral TID PRN Lauree Chandler, NP   50 mg at 12/05/22 2050   magnesium hydroxide (MILK OF MAGNESIA) suspension 30 mL  30 mL Oral Daily PRN Sunday Corn, NP       mirtazapine (REMERON) tablet 15 mg  15 mg Oral QHS Sarina Ill, DO   15 mg at 12/05/22 2050   OLANZapine (ZYPREXA) tablet 10 mg  10 mg Oral Q6H PRN Sarina Ill, DO       PARoxetine (PAXIL) tablet 20 mg  20 mg Oral Daily Sarina Ill, DO   20 mg at 12/06/22 1051   QUEtiapine (SEROQUEL) tablet 50 mg  50 mg Oral QHS Lauree Chandler, NP       traZODone (DESYREL) tablet 50 mg  50 mg Oral QHS PRN Sunday Corn, NP   50 mg at 12/05/22 2050    Lab Results: No results found for this or any previous visit (from the past 48 hour(s)).  Blood Alcohol level:  Lab Results  Component Value Date   ETH <10 11/29/2022   ETH <10 01/05/2022    Metabolic Disorder Labs: Lab Results  Component Value Date   HGBA1C 4.6 (L) 11/29/2022   MPG 85.32 11/29/2022   MPG 73.84 01/05/2022   Lab Results  Component Value Date   PROLACTIN 5.2 11/29/2022   PROLACTIN 8.1 09/11/2016   Lab Results  Component Value Date   CHOL 201 (H) 11/29/2022   TRIG 69 11/29/2022   HDL 73 11/29/2022   CHOLHDL 2.8 11/29/2022   VLDL 14 11/29/2022   LDLCALC 114 (H) 11/29/2022   LDLCALC 98  01/05/2022    Physical Findings: AIMS:  , ,  ,  ,    CIWA:    COWS:  Musculoskeletal: Strength & Muscle Tone: within normal limits Gait & Station: normal Patient leans: N/A  Psychiatric Specialty Exam:  Presentation  General Appearance:  Appropriate for Environment; Fairly Groomed  Eye Contact: Fair  Speech: Clear and Coherent; Normal Rate  Speech Volume: Normal  Handedness: Right   Mood and Affect  Mood: Anxious  Affect: Flat   Thought Process  Thought Processes: Coherent; Goal Directed; Linear  Descriptions of Associations:Intact  Orientation:Full (Time, Place and Person)  Thought Content:Paranoid Ideation; Other (comment)  History of Schizophrenia/Schizoaffective disorder:Yes  Duration of Psychotic Symptoms:Greater than six months  Hallucinations:Hallucinations: None  Ideas of Reference:None  Suicidal Thoughts:Suicidal Thoughts: No  Homicidal Thoughts:Homicidal Thoughts: No   Sensorium  Memory: Immediate Fair  Judgment: Intact  Insight: Shallow   Executive Functions  Concentration: Fair  Attention Span: Fair  Recall: Fair  Fund of Knowledge: Fair  Language: Fair   Psychomotor Activity  Psychomotor Activity: Psychomotor Activity: Normal   Assets  Assets: Communication Skills; Desire for Improvement; Financial Resources/Insurance; Resilience   Sleep  Sleep: Sleep: Fair    Physical Exam: Physical Exam Constitutional:      General: He is not in acute distress.    Appearance: He is not ill-appearing, toxic-appearing or diaphoretic.  Eyes:     General: No scleral icterus. Cardiovascular:     Rate and Rhythm: Normal rate.  Pulmonary:     Effort: Pulmonary effort is normal. No respiratory distress.  Neurological:     Mental Status: He is alert and oriented to person, place, and time.  Psychiatric:        Attention and Perception: Attention and perception normal.        Mood and Affect: Mood is  anxious. Affect is flat.        Speech: Speech normal.        Behavior: Behavior normal. Behavior is cooperative.        Thought Content: Thought content is paranoid. Thought content does not include homicidal or suicidal ideation.        Cognition and Memory: Cognition and memory normal.    Review of Systems  Constitutional:  Negative for chills and fever.  Respiratory:  Negative for shortness of breath.   Cardiovascular:  Negative for chest pain and palpitations.  Gastrointestinal:  Negative for abdominal pain.  Neurological:  Negative for headaches.  Psychiatric/Behavioral:  The patient is nervous/anxious.    Blood pressure 101/68, pulse 80, temperature 97.9 F (36.6 C), resp. rate (!) 21, height 5\' 11"  (1.803 m), weight 65.8 kg, SpO2 97%. Body mass index is 20.23 kg/m.   Treatment Plan Summary: Daily contact with patient to assess and evaluate symptoms and progress in treatment, Medication management, and Plan pt has agreed for seroquel 50mg  at bedtime to be ordered, given he will consider whether or not to take it when offered. Have discussed with pt potential discharge date of 12/10/22, which he is in agreement with.   Lauree Chandler, NP 12/06/2022, 1:27 PM

## 2022-12-06 NOTE — Progress Notes (Signed)
Patient alert and orient. Denies SI, HI, AVH. Medication compliant. Isolative to self, no interaction with staff, minimal with peers.  Encouragement and support provided. Safety checks maintained. Medications given as prescribed. Pt receptive and remains safe on unit with q 15 min checks.

## 2022-12-06 NOTE — Group Note (Signed)
Recreation Therapy Group Note   Group Topic:Relaxation  Group Date: 12/06/2022 Start Time: 1005 End Time: 1100 Facilitators: Rosina Lowenstein, LRT, CTRS Location:  Craft Room  Group Description: PMR (Progressive Muscle Relaxation). LRT asks patients their current level of stress/anxiety from 1-10, with 10 being the highest. LRT educates patients on what PMR is and the benefits that come from it. Patients are asked to sit with their feet flat on the floor while sitting up and all the way back in their chair, if possible. LRT and pts follow a prompt through a speaker that requires you to tense and release different muscles in their body and focus on their breathing. During session, lights are off and soft music is being played. At the end of the prompt, LRT asks patients to rank their current levels of stress/anxiety from 1-10, 10 being the highest.   Goal Area(s) Addressed:  Patients will be able to describe progressive muscle relaxation.  Patient will practice using relaxation technique. Patient will identify a new coping skill.  Patient will follow multistep directions to reduce anxiety and stress.   Affect/Mood: N/A   Participation Level: Did not attend    Clinical Observations/Individualized Feedback: Xzavion did not attend group.  Plan: Continue to engage patient in RT group sessions 2-3x/week.   Rosina Lowenstein, LRT, CTRS 12/06/2022 11:29 AM

## 2022-12-06 NOTE — Plan of Care (Signed)
  Problem: Education: Goal: Mental status will improve Outcome: Progressing   Problem: Activity: Goal: Sleeping patterns will improve Outcome: Progressing   Problem: Coping: Goal: Ability to verbalize frustrations and anger appropriately will improve Outcome: Not Progressing   Problem: Coping: Goal: Level of anxiety will decrease Outcome: Progressing

## 2022-12-06 NOTE — Group Note (Signed)
[facility] LCSW Group Therapy Note   Group Date: 12/06/2022 Start Time: 1340 End Time: 1350   Type of Therapy/Topic:  Group Therapy:  Balance in Life  Participation Level:  Minimal   Description of Group:    This group will address the concept of balance and how it feels and looks when one is unbalanced. Patients will be encouraged to process areas in their lives that are out of balance, and identify reasons for remaining unbalanced. Facilitators will guide patients utilizing problem- solving interventions to address and correct the stressor making their life unbalanced. Understanding and applying boundaries will be explored and addressed for obtaining  and maintaining a balanced life. Patients will be encouraged to explore ways to assertively make their unbalanced needs known to significant others in their lives, using other group members and facilitator for support and feedback.  Therapeutic Goals: Patient will identify two or more emotions or situations they have that consume much of in their lives. Patient will identify signs/triggers that life has become out of balance:  Patient will identify two ways to set boundaries in order to achieve balance in their lives:  Patient will demonstrate ability to communicate their needs through discussion and/or role plays  Summary of Patient Progress: Patient was the only person who came for group today. As such, facilitator allowed pt to asked any questions that he may have. Pt inquired about trying to get medicaid and how he should go about that. Facilitator provided education around patient's inquiry.   Therapeutic Modalities:   Cognitive Behavioral Therapy Solution-Focused Therapy Assertiveness Training   Glenis Smoker, LCSW

## 2022-12-06 NOTE — Group Note (Signed)
Date:  12/06/2022 Time:  2:55 PM  Group Topic/Focus:   Structured Activity Group   Participation Level:  Did Not Attend   Shane Small [name] 12/06/2022, 2:55 PM

## 2022-12-06 NOTE — Group Note (Signed)
Date:  12/06/2022 Time:  4:04 AM  Group Topic/Focus:  Building Self Esteem:   The Focus of this group is helping patients become aware of the effects of self-esteem on their lives, the things they and others do that enhance or undermine their self-esteem, seeing the relationship between their level of self-esteem and the choices they make and learning ways to enhance self-esteem. Goals Group:   The focus of this group is to help patients establish daily goals to achieve during treatment and discuss how the patient can incorporate goal setting into their daily lives to aide in recovery. Rediscovering Joy:   The focus of this group is to explore various ways to relieve stress in a positive manner. Wrap-Up Group:   The focus of this group is to help patients review their daily goal of treatment and discuss progress on daily workbooks.    Participation Level:  Did Not Attend  Participation Quality:   None  Engagement in Group:  None  Modes of Intervention:  Discussion and Education  Additional Comments:    Shane Small 12/06/2022, 4:04 AM

## 2022-12-06 NOTE — Progress Notes (Signed)
Patient is voluntary admission to [facility] for paranoid schizophrenia - psychosis, plysubstance abuse. He is calm, occasionally pacing, does not engage well with other (is quiet and prefers to be in his room).  Took his medication well. Denies S/I, H/I and AVH. Will continue to monitor.

## 2022-12-07 DIAGNOSIS — F200 Paranoid schizophrenia: Secondary | ICD-10-CM | POA: Diagnosis not present

## 2022-12-07 MED ORDER — HYDROXYZINE HCL 50 MG PO TABS
50.0000 mg | ORAL_TABLET | Freq: Every day | ORAL | Status: DC
  Administered 2022-12-08 – 2022-12-10 (×3): 50 mg via ORAL
  Filled 2022-12-07 (×3): qty 1

## 2022-12-07 MED ORDER — HYDROXYZINE HCL 50 MG PO TABS
50.0000 mg | ORAL_TABLET | Freq: Every day | ORAL | Status: DC | PRN
  Administered 2022-12-08: 50 mg via ORAL
  Filled 2022-12-07: qty 1

## 2022-12-07 NOTE — Group Note (Signed)
Date:  12/07/2022 Time:  4:12 PM  Group Topic/Focus:  Coping With Mental Health Crisis:   The purpose of this group is to help patients identify strategies for coping with mental health crisis.  Group discusses possible causes of crisis and ways to manage them effectively. Healthy Communication:   The focus of this group is to discuss communication, barriers to communication, as well as healthy ways to communicate with others.     Participation Level:  Did Not Attend  Participation Quality:    Affect:    Cognitive:    Insight:   Engagement in Group:    Modes of Intervention:    Additional Comments:    Shane Small 12/07/2022, 4:12 PM

## 2022-12-07 NOTE — Progress Notes (Signed)
[facility] MD Progress Note  12/07/2022 4:35 PM Shane Small  MRN:  [id number]  Subjective:  Pt chart reviewed, discussed w/ interdisciplinary team, and seen on rounds. He reported some moderate depression and anxiety, denies suicidal ideations and panic attacks.  Sleep and appetite are "ok".  He does not like how the antipsychotics makes him feel.  "It clouds my judgement".  Baseline level of paranoia, "I'm always paranoid".  He is agreeable to go to the [facility] on Monday.  Requested his hydroxyzine be scheduled in the am and PRN if needed later as he does not want to ask for it, request ordered.  Principal Problem: Schizophrenia, paranoid (HCC) Diagnosis: Principal Problem:   Schizophrenia, paranoid (HCC) Active Problems:   Methamphetamine use (HCC)  Total Time spent with patient: 45 minutes  Past Psychiatric History: Patient reports a past psychiatric hospitalization at [facility] although he is unsure when this was. Per chart review, patient was admitted to [facility] in May 2018 and has also presented to [facility], [facility], and [facility] multiple times from 2018-2023 for mental health concerns.   Past Medical History:  Past Medical History:  Diagnosis Date   Anxiety    Asthma    Daily headache    Depression    Paranoid schizophrenia (HCC)    Schizophrenia (HCC)    Stroke (HCC)     Past Surgical History:  Procedure Laterality Date   NO PAST SURGERIES     Family History:  Family History  Problem Relation Age of Onset   Asthma Mother    Diabetes Father    Hypertension Father    Colon cancer Father 66   Family Psychiatric  History: None reported Social History:  Social History   Substance and Sexual Activity  Alcohol Use Yes   Alcohol/week: 56.0 standard drinks of alcohol   Types: 28 Cans of beer, 28 Shots of liquor per week   Comment: occasionally     Social History   Substance and Sexual Activity  Drug Use Yes   Comment: CBD    Social History   Socioeconomic History   Marital  status: Single    Spouse name: Not on file   Number of children: 1   Years of education: Not on file   Highest education level: Not on file  Occupational History   Occupation: Location manager  Tobacco Use   Smoking status: Every Day    Types: Cigarettes   Smokeless tobacco: Never  Vaping Use   Vaping status: Former  Substance and Sexual Activity   Alcohol use: Yes    Alcohol/week: 56.0 standard drinks of alcohol    Types: 28 Cans of beer, 28 Shots of liquor per week    Comment: occasionally   Drug use: Yes    Comment: CBD   Sexual activity: Not on file  Other Topics Concern   Not on file  Social History Narrative   Not on file   Social Determinants of Health   Financial Resource Strain: Not on file  Food Insecurity: No Food Insecurity (11/29/2022)   Hunger Vital Sign    Worried About Running Out of Food in the Last Year: Never true    Ran Out of Food in the Last Year: Never true  Transportation Needs: Patient Declined (11/29/2022)   PRAPARE - Administrator, Civil Service (Medical): Patient declined    Lack of Transportation (Non-Medical): Patient declined  Physical Activity: Not on file  Stress: Not on file  Social Connections:  Not on file   Additional Social History: going to the [facility] on Monday   Sleep: Fair  Appetite:  Fair  Current Medications: Current Facility-Administered Medications  Medication Dose Route Frequency Provider Last Rate Last Admin   acetaminophen (TYLENOL) tablet 650 mg  650 mg Oral Q6H PRN Sunday Corn, NP       albuterol (VENTOLIN HFA) 108 (90 Base) MCG/ACT inhaler 2 puff  2 puff Inhalation Q4H PRN Sarina Ill, DO   2 puff at 12/07/22 0859   alum & mag hydroxide-simeth (MAALOX/MYLANTA) 200-200-20 MG/5ML suspension 30 mL  30 mL Oral Q4H PRN Sunday Corn, NP       diphenhydrAMINE (BENADRYL) capsule 50 mg  50 mg Oral TID PRN Sunday Corn, NP       Or   diphenhydrAMINE (BENADRYL) injection 50 mg  50 mg  Intramuscular TID PRN Sunday Corn, NP       haloperidol (HALDOL) tablet 5 mg  5 mg Oral TID PRN Sunday Corn, NP       Or   haloperidol lactate (HALDOL) injection 5 mg  5 mg Intramuscular TID PRN Sunday Corn, NP       hydrOXYzine (ATARAX) tablet 50 mg  50 mg Oral TID PRN Lauree Chandler, NP   50 mg at 12/07/22 0900   magnesium hydroxide (MILK OF MAGNESIA) suspension 30 mL  30 mL Oral Daily PRN Sunday Corn, NP       mirtazapine (REMERON) tablet 15 mg  15 mg Oral QHS Sarina Ill, DO   15 mg at 12/06/22 2145   OLANZapine (ZYPREXA) tablet 10 mg  10 mg Oral Q6H PRN Sarina Ill, DO       PARoxetine (PAXIL) tablet 20 mg  20 mg Oral Daily Sarina Ill, DO   20 mg at 12/07/22 0857   QUEtiapine (SEROQUEL) tablet 50 mg  50 mg Oral QHS Lauree Chandler, NP   50 mg at 12/06/22 2145   traZODone (DESYREL) tablet 50 mg  50 mg Oral QHS PRN Sunday Corn, NP   50 mg at 12/06/22 2145    Lab Results: No results found for this or any previous visit (from the past 48 hour(s)).  Blood Alcohol level:  Lab Results  Component Value Date   ETH <10 11/29/2022   ETH <10 01/05/2022    Metabolic Disorder Labs: Lab Results  Component Value Date   HGBA1C 4.6 (L) 11/29/2022   MPG 85.32 11/29/2022   MPG 73.84 01/05/2022   Lab Results  Component Value Date   PROLACTIN 5.2 11/29/2022   PROLACTIN 8.1 09/11/2016   Lab Results  Component Value Date   CHOL 201 (H) 11/29/2022   TRIG 69 11/29/2022   HDL 73 11/29/2022   CHOLHDL 2.8 11/29/2022   VLDL 14 11/29/2022   LDLCALC 114 (H) 11/29/2022   LDLCALC 98 01/05/2022    Physical Findings: AIMS:  , ,  ,  ,    CIWA:    COWS:     Musculoskeletal: Strength & Muscle Tone: within normal limits Gait & Station: normal Patient leans: N/A  Psychiatric Specialty Exam: Physical Exam Vitals and nursing note reviewed.  Constitutional:      General: He is not in acute distress.    Appearance: Normal  appearance. He is not ill-appearing, toxic-appearing or diaphoretic.  HENT:     Head: Normocephalic.     Nose: Nose normal.  Eyes:     General:  No scleral icterus. Pulmonary:     Effort: Pulmonary effort is normal. No respiratory distress.  Musculoskeletal:        General: Normal range of motion.     Cervical back: Normal range of motion.  Neurological:     General: No focal deficit present.     Mental Status: He is alert and oriented to person, place, and time.  Psychiatric:        Attention and Perception: Attention and perception normal.        Mood and Affect: Mood is anxious and depressed. Affect is flat.        Speech: Speech normal.        Behavior: Behavior is cooperative.        Thought Content: Thought content is paranoid. Thought content does not include homicidal or suicidal ideation.        Cognition and Memory: Cognition and memory normal.        Judgment: Judgment normal.     Review of Systems  Constitutional:  Negative for chills and fever.  Respiratory:  Negative for shortness of breath.   Cardiovascular:  Negative for chest pain and palpitations.  Gastrointestinal:  Negative for abdominal pain.  Neurological:  Negative for headaches.  Psychiatric/Behavioral:  Positive for depression. The patient is nervous/anxious.     Blood pressure 91/66, pulse 71, temperature 97.9 F (36.6 C), resp. rate 20, height 5\' 11"  (1.803 m), weight 65.8 kg, SpO2 98%.Body mass index is 20.23 kg/m.  General Appearance: Casual  Eye Contact:  Good  Speech:  Normal Rate  Volume:  Normal  Mood:  Anxious and Depressed  Affect:  Flat  Thought Process:  Coherent  Orientation:  Full (Time, Place, and Person)  Thought Content:  Paranoid Ideation  Suicidal Thoughts:  No  Homicidal Thoughts:  No  Memory:  Immediate;   Good Recent;   Good Remote;   Good  Judgement:  Fair  Insight:  Fair  Psychomotor Activity:  Normal  Concentration:  Concentration: Fair and Attention Span: Fair   Recall:  Fair  Fund of Knowledge:  Good  Language:  Good  Akathisia:  No  Handed:  Right  AIMS (if indicated):     Assets:  Leisure Time Physical Health Resilience  ADL's:  Intact  Cognition:  WNL  Sleep:         Physical Exam: Physical Exam Vitals and nursing note reviewed.  Constitutional:      General: He is not in acute distress.    Appearance: Normal appearance. He is not ill-appearing, toxic-appearing or diaphoretic.  HENT:     Head: Normocephalic.     Nose: Nose normal.  Eyes:     General: No scleral icterus. Pulmonary:     Effort: Pulmonary effort is normal. No respiratory distress.  Musculoskeletal:        General: Normal range of motion.     Cervical back: Normal range of motion.  Neurological:     General: No focal deficit present.     Mental Status: He is alert and oriented to person, place, and time.  Psychiatric:        Attention and Perception: Attention and perception normal.        Mood and Affect: Mood is anxious and depressed. Affect is flat.        Speech: Speech normal.        Behavior: Behavior is cooperative.        Thought Content: Thought content is paranoid. Thought  content does not include homicidal or suicidal ideation.        Cognition and Memory: Cognition and memory normal.        Judgment: Judgment normal.    Review of Systems  Constitutional:  Negative for chills and fever.  Respiratory:  Negative for shortness of breath.   Cardiovascular:  Negative for chest pain and palpitations.  Gastrointestinal:  Negative for abdominal pain.  Neurological:  Negative for headaches.  Psychiatric/Behavioral:  Positive for depression. The patient is nervous/anxious.    Blood pressure 91/66, pulse 71, temperature 97.9 F (36.6 C), resp. rate 20, height 5\' 11"  (1.803 m), weight 65.8 kg, SpO2 98%. Body mass index is 20.23 kg/m.   Treatment Plan Summary: Schizophrenia, paranoid type: Seroquel 50 mg daily at bedtime   Depression: Paxil 20 mg  daily  Insomnia: Remeron 15 mg daily at bedtime Trazodone 50 mg at bedtime PRN  Anxiety: Hydroxyzine 50 mg in the am and daily PRN per his request  Nanine Means, NP 12/07/2022, 4:35 PM

## 2022-12-07 NOTE — Plan of Care (Signed)
  Problem: Education: Goal: Knowledge of [facility] Education information/materials will improve Outcome: Progressing Goal: Emotional status will improve Outcome: Progressing Goal: Mental status will improve Outcome: Progressing Goal: Verbalization of understanding the information provided will improve Outcome: Progressing   Problem: Activity: Goal: Interest or engagement in activities will improve Outcome: Progressing Goal: Sleeping patterns will improve Outcome: Progressing   Problem: Coping: Goal: Ability to verbalize frustrations and anger appropriately will improve Outcome: Progressing Goal: Ability to demonstrate self-control will improve Outcome: Progressing   Problem: Health Behavior/Discharge Planning: Goal: Identification of resources available to assist in meeting health care needs will improve Outcome: Progressing Goal: Compliance with treatment plan for underlying cause of condition will improve Outcome: Progressing   Problem: Physical Regulation: Goal: Ability to maintain clinical measurements within normal limits will improve Outcome: Progressing   Problem: Safety: Goal: Periods of time without injury will increase Outcome: Progressing   Problem: Education: Goal: Knowledge of General Education information will improve Description: Including pain rating scale, medication(s)/side effects and non-pharmacologic comfort measures Outcome: Progressing   Problem: Health Behavior/Discharge Planning: Goal: Ability to manage health-related needs will improve Outcome: Progressing   Problem: Clinical Measurements: Goal: Ability to maintain clinical measurements within normal limits will improve Outcome: Progressing Goal: Will remain free from infection Outcome: Progressing Goal: Diagnostic test results will improve Outcome: Progressing Goal: Respiratory complications will improve Outcome: Progressing Goal: Cardiovascular complication will be  avoided Outcome: Progressing   Problem: Activity: Goal: Risk for activity intolerance will decrease Outcome: Progressing   Problem: Nutrition: Goal: Adequate nutrition will be maintained Outcome: Progressing   Problem: Coping: Goal: Level of anxiety will decrease Outcome: Progressing   Problem: Elimination: Goal: Will not experience complications related to bowel motility Outcome: Progressing Goal: Will not experience complications related to urinary retention Outcome: Progressing   Problem: Pain Managment: Goal: General experience of comfort will improve Outcome: Progressing   Problem: Safety: Goal: Ability to remain free from injury will improve Outcome: Progressing   Problem: Skin Integrity: Goal: Risk for impaired skin integrity will decrease Outcome: Progressing

## 2022-12-07 NOTE — Group Note (Signed)
Recreation Therapy Group Note   Group Topic:Leisure Education  Group Date: 12/07/2022 Start Time: 1005 End Time: 1100 Facilitators: Rosina Lowenstein, LRT, CTRS Location:  Craft Room  Group Description: Leisure. Patients were given the option to choose from coloring, singing karaoke, making origami, journaling, or playing cards. LRT and pts discussed the meaning of leisure, the importance of participating in leisure during their free time/when they're outside of the hospital, as well as how our leisure interests can also serve as coping skills. Pt identified two leisure interests and shared with the group.   Goal Area(s) Addressed:  Patient will identify a current leisure interest.  Patient will learn the definition of "leisure". Patient will practice making a positive decision. Patient will have the opportunity to try a new leisure activity. Patient will communicate with peers and LRT.    Affect/Mood: N/A   Participation Level: Did not attend    Clinical Observations/Individualized Feedback: Chester did not attend group.  Plan: Continue to engage patient in RT group sessions 2-3x/week.   Rosina Lowenstein, LRT, CTRS 12/07/2022 11:13 AM

## 2022-12-07 NOTE — Plan of Care (Signed)
D- Patient alert and oriented. Affect/mood. Denies SI, HI, AVH, and pain. Pt came to medication room for am meds without prompting. He is pleasant and asked questions  A- Scheduled medications administered to patient, per MD orders. Support and encouragement provided.  Routine safety checks conducted every 15 minutes.  Patient informed to notify staff with problems or concerns. R- No adverse drug reactions noted. Patient contracts for safety at this time. Patient compliant with medications and treatment plan. Patient receptive, calm, and cooperative. Patient interacts well with others on the unit.  Patient remains safe at this time.

## 2022-12-07 NOTE — Group Note (Signed)
Date:  12/07/2022 Time:  10:29 AM  Group Topic/Focus:  Goals Group:   The focus of this group is to help patients establish daily goals to achieve during treatment and discuss how the patient can incorporate goal setting into their daily lives to aide in recovery.    Participation Level:  Did Not Attend   Lynelle Smoke [facility] 12/07/2022, 10:29 AM

## 2022-12-08 NOTE — Group Note (Signed)
Date:  12/08/2022 Time:  11:17 AM  Group Topic/Focus:  Therapeutic Outdoor Recreation Activity.    Participation Level:  Did Not Attend   Rosaura Carpenter 12/08/2022, 11:17 AM

## 2022-12-08 NOTE — Group Note (Signed)
Date:  12/08/2022 Time:  2:24 AM  Group Topic/Focus:  Wrap-Up Group:   The focus of this group is to help patients review their daily goal of treatment and discuss progress on daily workbooks.    Participation Level:  Active  Participation Quality:  Appropriate  Affect:  Appropriate  Cognitive:  Alert  Insight: Appropriate  Engagement in Group:  Engaged  Modes of Intervention:  Activity  Additional Comments:     Shane Small,Shane Small 12/08/2022, 2:24 AM

## 2022-12-08 NOTE — Progress Notes (Signed)
Nursing Shift Note:  1900-0700  Attended Evening Group: yes Medication Compliant: yes Behavior: anxious and cooperative Sleep Quality: good Significant Changes: none  The patient accepted his medications as ordered.  No significant changes to note.

## 2022-12-08 NOTE — Group Note (Signed)
Date:  12/08/2022 Time:  4:54 PM  Group Topic/Focus:  Goals Group:   The focus of this group is to help patients establish daily goals to achieve during treatment and discuss how the patient can incorporate goal setting into their daily lives to aide in recovery. Making Healthy Choices:   The focus of this group is to help patients identify negative/unhealthy choices they were using prior to admission and identify positive/healthier coping strategies to replace them upon discharge.    Participation Level:  Did Not Attend   Lynelle Smoke [facility] 12/08/2022, 4:54 PM

## 2022-12-08 NOTE — Progress Notes (Signed)
[facility] MD Progress Note  12/08/2022 12:38 PM CANDICE MISNER  MRN:  [id number]  Subjective:  Pt chart, labs, and notes reviewed.  The client reported his sleep is "pretty good".  He was resting quietly in be prior to his assessment this afternoon as he stated he likes to sleep when he can since he is homeless and that's not always possible.  Depression and anxiety are moderate, no suicidal ideations.  He does feel the hydroxyzine in the am is "really helping".  Appetite is "good".  No current hallucinations, these usually surface when he has "pains in my body."  Some dry mouth from his medications, encouraged fluids.  He interacts in the milieu appropriately with staff and peers.  Principal Problem: Schizophrenia, paranoid (HCC) Diagnosis: Principal Problem:   Schizophrenia, paranoid (HCC) Active Problems:   Methamphetamine use (HCC)  Total Time spent with patient: 30 minutes  Past Psychiatric History: Patient reports a past psychiatric hospitalization at [facility] although he is unsure when this was. Per chart review, patient was admitted to [facility] in May 2018 and has also presented to [facility], [facility], and [facility] multiple times from 2018-2023 for mental health concerns.   Past Medical History:  Past Medical History:  Diagnosis Date   Anxiety    Asthma    Daily headache    Depression    Paranoid schizophrenia (HCC)    Schizophrenia (HCC)    Stroke (HCC)     Past Surgical History:  Procedure Laterality Date   NO PAST SURGERIES     Family History:  Family History  Problem Relation Age of Onset   Asthma Mother    Diabetes Father    Hypertension Father    Colon cancer Father 41   Family Psychiatric  History: None reported Social History:  Social History   Substance and Sexual Activity  Alcohol Use Yes   Alcohol/week: 56.0 standard drinks of alcohol   Types: 28 Cans of beer, 28 Shots of liquor per week   Comment: occasionally     Social History   Substance and Sexual Activity   Drug Use Yes   Comment: CBD    Social History   Socioeconomic History   Marital status: Single    Spouse name: Not on file   Number of children: 1   Years of education: Not on file   Highest education level: Not on file  Occupational History   Occupation: Location manager  Tobacco Use   Smoking status: Every Day    Types: Cigarettes   Smokeless tobacco: Never  Vaping Use   Vaping status: Former  Substance and Sexual Activity   Alcohol use: Yes    Alcohol/week: 56.0 standard drinks of alcohol    Types: 28 Cans of beer, 28 Shots of liquor per week    Comment: occasionally   Drug use: Yes    Comment: CBD   Sexual activity: Not on file  Other Topics Concern   Not on file  Social History Narrative   Not on file   Social Determinants of Health   Financial Resource Strain: Not on file  Food Insecurity: No Food Insecurity (11/29/2022)   Hunger Vital Sign    Worried About Running Out of Food in the Last Year: Never true    Ran Out of Food in the Last Year: Never true  Transportation Needs: Patient Declined (11/29/2022)   PRAPARE - Administrator, Civil Service (Medical): Patient declined    Lack of Transportation (Non-Medical):  Patient declined  Physical Activity: Not on file  Stress: Not on file  Social Connections: Not on file   Additional Social History: going to the [facility] on Monday   Sleep: good  Appetite:  good  Current Medications: Current Facility-Administered Medications  Medication Dose Route Frequency Provider Last Rate Last Admin   acetaminophen (TYLENOL) tablet 650 mg  650 mg Oral Q6H PRN Sunday Corn, NP       albuterol (VENTOLIN HFA) 108 (90 Base) MCG/ACT inhaler 2 puff  2 puff Inhalation Q4H PRN Sarina Ill, DO   2 puff at 12/07/22 2136   alum & mag hydroxide-simeth (MAALOX/MYLANTA) 200-200-20 MG/5ML suspension 30 mL  30 mL Oral Q4H PRN Sunday Corn, NP       diphenhydrAMINE (BENADRYL) capsule 50 mg  50 mg Oral TID PRN  Sunday Corn, NP       Or   diphenhydrAMINE (BENADRYL) injection 50 mg  50 mg Intramuscular TID PRN Sunday Corn, NP       haloperidol (HALDOL) tablet 5 mg  5 mg Oral TID PRN Sunday Corn, NP       Or   haloperidol lactate (HALDOL) injection 5 mg  5 mg Intramuscular TID PRN Sunday Corn, NP       hydrOXYzine (ATARAX) tablet 50 mg  50 mg Oral Daily PRN Charm Rings, NP       hydrOXYzine (ATARAX) tablet 50 mg  50 mg Oral Daily Charm Rings, NP   50 mg at 12/08/22 0913   magnesium hydroxide (MILK OF MAGNESIA) suspension 30 mL  30 mL Oral Daily PRN Sunday Corn, NP       mirtazapine (REMERON) tablet 15 mg  15 mg Oral QHS Sarina Ill, DO   15 mg at 12/07/22 2136   OLANZapine (ZYPREXA) tablet 10 mg  10 mg Oral Q6H PRN Sarina Ill, DO       PARoxetine (PAXIL) tablet 20 mg  20 mg Oral Daily Sarina Ill, DO   20 mg at 12/08/22 0913   QUEtiapine (SEROQUEL) tablet 50 mg  50 mg Oral QHS Lauree Chandler, NP   50 mg at 12/07/22 2135   traZODone (DESYREL) tablet 50 mg  50 mg Oral QHS PRN Sunday Corn, NP   50 mg at 12/07/22 2135    Lab Results: No results found for this or any previous visit (from the past 48 hour(s)).  Blood Alcohol level:  Lab Results  Component Value Date   ETH <10 11/29/2022   ETH <10 01/05/2022    Metabolic Disorder Labs: Lab Results  Component Value Date   HGBA1C 4.6 (L) 11/29/2022   MPG 85.32 11/29/2022   MPG 73.84 01/05/2022   Lab Results  Component Value Date   PROLACTIN 5.2 11/29/2022   PROLACTIN 8.1 09/11/2016   Lab Results  Component Value Date   CHOL 201 (H) 11/29/2022   TRIG 69 11/29/2022   HDL 73 11/29/2022   CHOLHDL 2.8 11/29/2022   VLDL 14 11/29/2022   LDLCALC 114 (H) 11/29/2022   LDLCALC 98 01/05/2022     Musculoskeletal: Strength & Muscle Tone: within normal limits Gait & Station: normal Patient leans: N/A  Psychiatric Specialty Exam: Physical Exam Vitals and  nursing note reviewed.  Constitutional:      General: He is not in acute distress.    Appearance: Normal appearance. He is not ill-appearing, toxic-appearing or diaphoretic.  HENT:     Head:  Normocephalic.     Nose: Nose normal.  Eyes:     General: No scleral icterus. Pulmonary:     Effort: Pulmonary effort is normal. No respiratory distress.  Musculoskeletal:        General: Normal range of motion.     Cervical back: Normal range of motion.  Neurological:     General: No focal deficit present.     Mental Status: He is alert and oriented to person, place, and time.  Psychiatric:        Attention and Perception: Attention and perception normal.        Mood and Affect: Mood is anxious and depressed. Affect is flat.        Speech: Speech normal.        Behavior: Behavior is cooperative.        Thought Content: Thought content is paranoid. Thought content does not include homicidal or suicidal ideation.        Cognition and Memory: Cognition and memory normal.        Judgment: Judgment normal.     Review of Systems  Constitutional:  Negative for chills and fever.  Respiratory:  Negative for shortness of breath.   Cardiovascular:  Negative for chest pain and palpitations.  Gastrointestinal:  Negative for abdominal pain.  Neurological:  Negative for headaches.  Psychiatric/Behavioral:  Positive for depression. The patient is nervous/anxious.     Blood pressure 91/66, pulse 71, temperature 97.9 F (36.6 C), resp. rate 20, height 5\' 11"  (1.803 m), weight 65.8 kg, SpO2 98%.Body mass index is 20.23 kg/m.  General Appearance: Casual  Eye Contact:  Good  Speech:  Normal Rate  Volume:  Normal  Mood:  Anxious and Depressed  Affect:  Flat  Thought Process:  Coherent  Orientation:  Full (Time, Place, and Person)  Thought Content:  WDL  Suicidal Thoughts:  No  Homicidal Thoughts:  No  Memory:  Immediate;   Good Recent;   Good Remote;   Good  Judgement:  Fair  Insight:  Fair   Psychomotor Activity:  Normal  Concentration:  Concentration: Fair and Attention Span: Fair  Recall:  Fair  Fund of Knowledge:  Good  Language:  Good  Akathisia:  No  Handed:  Right  AIMS (if indicated):     Assets:  Leisure Time Physical Health Resilience  ADL's:  Intact  Cognition:  WNL  Sleep:         Physical Exam: Physical Exam Vitals and nursing note reviewed.  Constitutional:      General: He is not in acute distress.    Appearance: Normal appearance. He is not ill-appearing, toxic-appearing or diaphoretic.  HENT:     Head: Normocephalic.     Nose: Nose normal.  Eyes:     General: No scleral icterus. Pulmonary:     Effort: Pulmonary effort is normal. No respiratory distress.  Musculoskeletal:        General: Normal range of motion.     Cervical back: Normal range of motion.  Neurological:     General: No focal deficit present.     Mental Status: He is alert and oriented to person, place, and time.  Psychiatric:        Attention and Perception: Attention and perception normal.        Mood and Affect: Mood is anxious and depressed. Affect is flat.        Speech: Speech normal.        Behavior: Behavior is cooperative.  Thought Content: Thought content is paranoid. Thought content does not include homicidal or suicidal ideation.        Cognition and Memory: Cognition and memory normal.        Judgment: Judgment normal.    Review of Systems  Constitutional:  Negative for chills and fever.  Respiratory:  Negative for shortness of breath.   Cardiovascular:  Negative for chest pain and palpitations.  Gastrointestinal:  Negative for abdominal pain.  Neurological:  Negative for headaches.  Psychiatric/Behavioral:  Positive for depression. The patient is nervous/anxious.    Blood pressure 91/66, pulse 71, temperature 97.9 F (36.6 C), resp. rate 20, height 5\' 11"  (1.803 m), weight 65.8 kg, SpO2 98%. Body mass index is 20.23 kg/m.   Treatment Plan  Summary: Schizophrenia, paranoid type: Seroquel 50 mg daily at bedtime   Depression: Paxil 20 mg daily  Insomnia: Remeron 15 mg daily at bedtime Trazodone 50 mg at bedtime PRN  Anxiety: Hydroxyzine 50 mg in the am and daily PRN per his request  Nanine Means, NP 12/08/2022, 12:38 PM

## 2022-12-08 NOTE — Group Note (Signed)
Date:  12/08/2022 Time:  6:35 PM  Group Topic/Focus:  Activity Group    Participation Level:  Active  Participation Quality:  Appropriate  Affect:  Appropriate  Cognitive:  Appropriate  Insight: Appropriate  Engagement in Group:  Engaged  Modes of Intervention:  Activity  Additional Comments:    Wilford Corner 12/08/2022, 6:35 PM

## 2022-12-08 NOTE — Group Note (Signed)
Date:  12/08/2022 Time:  9:30 PM  Group Topic/Focus:  Wrap-Up Group:   The focus of this group is to help patients review their daily goal of treatment and discuss progress on daily workbooks.    Participation Level:  Minimal  Participation Quality:  Appropriate  Affect:  Appropriate  Cognitive:  Alert  Insight: Good  Engagement in Group:  Limited  Modes of Intervention:  Activity  Additional Comments:     Maglione,Latrese Carolan E 12/08/2022, 9:30 PM

## 2022-12-08 NOTE — Progress Notes (Signed)
Patient is admitted to [facility] for paranoid schizophrenia - psychosis and polysubstance. He is calm, cooperative, pleasant with staff and peers.  Likes to sleep but states because he is homeless and wants to rest while he can.  Denies S/I, H/I, AVH but has been talking to himself.  Will continue to monitor.

## 2022-12-09 NOTE — Group Note (Signed)
Date:  12/09/2022 Time:  7:19 PM  Group Topic/Focus:  Activity/Outdoor Recreation    Participation Level:  Active  Participation Quality:  Appropriate  Affect:  Appropriate  Cognitive:  Appropriate  Insight: Appropriate  Engagement in Group:  Engaged  Modes of Intervention:  Activity  Additional Comments:    Wilford Corner 12/09/2022, 7:19 PM

## 2022-12-09 NOTE — Progress Notes (Signed)
[facility] MD Progress Note  12/09/2022 11:09 AM Shane Small  MRN:  [id number]  Subjective:  Pt chart, labs, and notes reviewed.  The client reported his sleep is "ok" and appetite is "good".  He is doing "fine", depression voiced related to not having a job and financial issues, no suicidal ideations.  Denies hallucinations and paranoia.  Anxiety is moderate when he thinks about being homeless and need for a job.  Denies side effects today of his medications.  Principal Problem: Schizophrenia, paranoid (HCC) Diagnosis: Principal Problem:   Schizophrenia, paranoid (HCC) Active Problems:   Methamphetamine use (HCC)  Total Time spent with patient: 30 minutes  Past Psychiatric History: Patient reports a past psychiatric hospitalization at [facility] although he is unsure when this was. Per chart review, patient was admitted to [facility] in May 2018 and has also presented to [facility], [facility], and [facility] multiple times from 2018-2023 for mental health concerns.   Past Medical History:  Past Medical History:  Diagnosis Date   Anxiety    Asthma    Daily headache    Depression    Paranoid schizophrenia (HCC)    Schizophrenia (HCC)    Stroke (HCC)     Past Surgical History:  Procedure Laterality Date   NO PAST SURGERIES     Family History:  Family History  Problem Relation Age of Onset   Asthma Mother    Diabetes Father    Hypertension Father    Colon cancer Father 69   Family Psychiatric  History: None reported Social History:  Social History   Substance and Sexual Activity  Alcohol Use Yes   Alcohol/week: 56.0 standard drinks of alcohol   Types: 28 Cans of beer, 28 Shots of liquor per week   Comment: occasionally     Social History   Substance and Sexual Activity  Drug Use Yes   Comment: CBD    Social History   Socioeconomic History   Marital status: Single    Spouse name: Not on file   Number of children: 1   Years of education: Not on file   Highest education level: Not  on file  Occupational History   Occupation: Location manager  Tobacco Use   Smoking status: Every Day    Types: Cigarettes   Smokeless tobacco: Never  Vaping Use   Vaping status: Former  Substance and Sexual Activity   Alcohol use: Yes    Alcohol/week: 56.0 standard drinks of alcohol    Types: 28 Cans of beer, 28 Shots of liquor per week    Comment: occasionally   Drug use: Yes    Comment: CBD   Sexual activity: Not on file  Other Topics Concern   Not on file  Social History Narrative   Not on file   Social Determinants of Health   Financial Resource Strain: Not on file  Food Insecurity: No Food Insecurity (11/29/2022)   Hunger Vital Sign    Worried About Running Out of Food in the Last Year: Never true    Ran Out of Food in the Last Year: Never true  Transportation Needs: Patient Declined (11/29/2022)   PRAPARE - Administrator, Civil Service (Medical): Patient declined    Lack of Transportation (Non-Medical): Patient declined  Physical Activity: Not on file  Stress: Not on file  Social Connections: Not on file   Additional Social History: going to the [facility] on Monday   Sleep: good  Appetite:  good  Current Medications:  Current Facility-Administered Medications  Medication Dose Route Frequency Provider Last Rate Last Admin   acetaminophen (TYLENOL) tablet 650 mg  650 mg Oral Q6H PRN Sunday Corn, NP       albuterol (VENTOLIN HFA) 108 (90 Base) MCG/ACT inhaler 2 puff  2 puff Inhalation Q4H PRN Sarina Ill, DO   2 puff at 12/09/22 0821   alum & mag hydroxide-simeth (MAALOX/MYLANTA) 200-200-20 MG/5ML suspension 30 mL  30 mL Oral Q4H PRN Sunday Corn, NP       diphenhydrAMINE (BENADRYL) capsule 50 mg  50 mg Oral TID PRN Sunday Corn, NP       Or   diphenhydrAMINE (BENADRYL) injection 50 mg  50 mg Intramuscular TID PRN Sunday Corn, NP       haloperidol (HALDOL) tablet 5 mg  5 mg Oral TID PRN Sunday Corn, NP       Or    haloperidol lactate (HALDOL) injection 5 mg  5 mg Intramuscular TID PRN Sunday Corn, NP       hydrOXYzine (ATARAX) tablet 50 mg  50 mg Oral Daily PRN Charm Rings, NP   50 mg at 12/08/22 1832   hydrOXYzine (ATARAX) tablet 50 mg  50 mg Oral Daily Charm Rings, NP   50 mg at 12/09/22 0820   magnesium hydroxide (MILK OF MAGNESIA) suspension 30 mL  30 mL Oral Daily PRN Sunday Corn, NP       mirtazapine (REMERON) tablet 15 mg  15 mg Oral QHS Sarina Ill, DO   15 mg at 12/08/22 2109   OLANZapine (ZYPREXA) tablet 10 mg  10 mg Oral Q6H PRN Sarina Ill, DO       PARoxetine (PAXIL) tablet 20 mg  20 mg Oral Daily Sarina Ill, DO   20 mg at 12/09/22 0820   QUEtiapine (SEROQUEL) tablet 50 mg  50 mg Oral QHS Lauree Chandler, NP   50 mg at 12/08/22 2109   traZODone (DESYREL) tablet 50 mg  50 mg Oral QHS PRN Sunday Corn, NP   50 mg at 12/08/22 2108    Lab Results: No results found for this or any previous visit (from the past 48 hour(s)).  Blood Alcohol level:  Lab Results  Component Value Date   ETH <10 11/29/2022   ETH <10 01/05/2022    Metabolic Disorder Labs: Lab Results  Component Value Date   HGBA1C 4.6 (L) 11/29/2022   MPG 85.32 11/29/2022   MPG 73.84 01/05/2022   Lab Results  Component Value Date   PROLACTIN 5.2 11/29/2022   PROLACTIN 8.1 09/11/2016   Lab Results  Component Value Date   CHOL 201 (H) 11/29/2022   TRIG 69 11/29/2022   HDL 73 11/29/2022   CHOLHDL 2.8 11/29/2022   VLDL 14 11/29/2022   LDLCALC 114 (H) 11/29/2022   LDLCALC 98 01/05/2022     Musculoskeletal: Strength & Muscle Tone: within normal limits Gait & Station: normal Patient leans: N/A  Psychiatric Specialty Exam: Physical Exam Vitals and nursing note reviewed.  Constitutional:      General: He is not in acute distress.    Appearance: Normal appearance. He is not ill-appearing, toxic-appearing or diaphoretic.  HENT:     Head:  Normocephalic.     Nose: Nose normal.  Eyes:     General: No scleral icterus. Pulmonary:     Effort: Pulmonary effort is normal. No respiratory distress.  Musculoskeletal:  General: Normal range of motion.     Cervical back: Normal range of motion.  Neurological:     General: No focal deficit present.     Mental Status: He is alert and oriented to person, place, and time.  Psychiatric:        Attention and Perception: Attention and perception normal.        Mood and Affect: Mood is anxious and depressed.        Speech: Speech normal.        Behavior: Behavior is cooperative.        Thought Content: Thought content normal. Thought content does not include homicidal or suicidal ideation.        Cognition and Memory: Cognition and memory normal.        Judgment: Judgment normal.     Review of Systems  Constitutional:  Negative for chills and fever.  Respiratory:  Negative for shortness of breath.   Cardiovascular:  Negative for chest pain and palpitations.  Gastrointestinal:  Negative for abdominal pain.  Neurological:  Negative for headaches.  Psychiatric/Behavioral:  Positive for depression. The patient is nervous/anxious.     Blood pressure 109/76, pulse 76, temperature 97.9 F (36.6 C), temperature source Oral, resp. rate 18, height 5\' 11"  (1.803 m), weight 65.8 kg, SpO2 98%.Body mass index is 20.23 kg/m.  General Appearance: Casual  Eye Contact:  Good  Speech:  Normal Rate  Volume:  Normal  Mood:  Anxious and Depressed  Affect:  Flat  Thought Process:  Coherent  Orientation:  Full (Time, Place, and Person)  Thought Content:  WDL  Suicidal Thoughts:  No  Homicidal Thoughts:  No  Memory:  Immediate;   Good Recent;   Good Remote;   Good  Judgement:  Fair  Insight:  Fair  Psychomotor Activity:  Normal  Concentration:  Concentration: Fair and Attention Span: Fair  Recall:  Fair  Fund of Knowledge:  Good  Language:  Good  Akathisia:  No  Handed:  Right  AIMS  (if indicated):     Assets:  Leisure Time Physical Health Resilience  ADL's:  Intact  Cognition:  WNL  Sleep:         Physical Exam: Physical Exam Vitals and nursing note reviewed.  Constitutional:      General: He is not in acute distress.    Appearance: Normal appearance. He is not ill-appearing, toxic-appearing or diaphoretic.  HENT:     Head: Normocephalic.     Nose: Nose normal.  Eyes:     General: No scleral icterus. Pulmonary:     Effort: Pulmonary effort is normal. No respiratory distress.  Musculoskeletal:        General: Normal range of motion.     Cervical back: Normal range of motion.  Neurological:     General: No focal deficit present.     Mental Status: He is alert and oriented to person, place, and time.  Psychiatric:        Attention and Perception: Attention and perception normal.        Mood and Affect: Mood is anxious and depressed.        Speech: Speech normal.        Behavior: Behavior is cooperative.        Thought Content: Thought content normal. Thought content does not include homicidal or suicidal ideation.        Cognition and Memory: Cognition and memory normal.        Judgment: Judgment  normal.    Review of Systems  Constitutional:  Negative for chills and fever.  Respiratory:  Negative for shortness of breath.   Cardiovascular:  Negative for chest pain and palpitations.  Gastrointestinal:  Negative for abdominal pain.  Neurological:  Negative for headaches.  Psychiatric/Behavioral:  Positive for depression. The patient is nervous/anxious.    Blood pressure 109/76, pulse 76, temperature 97.9 F (36.6 C), temperature source Oral, resp. rate 18, height 5\' 11"  (1.803 m), weight 65.8 kg, SpO2 98%. Body mass index is 20.23 kg/m.   Treatment Plan Summary: Schizophrenia, paranoid type: Seroquel 50 mg daily at bedtime   Depression: Paxil 20 mg daily  Insomnia: Remeron 15 mg daily at bedtime Trazodone 50 mg at bedtime  PRN  Anxiety: Hydroxyzine 50 mg in the am and daily PRN per his request  Nanine Means, NP 12/09/2022, 11:09 AM

## 2022-12-09 NOTE — Plan of Care (Signed)
  Problem: Education: Goal: Knowledge of [facility] Education information/materials will improve Outcome: Progressing Goal: Emotional status will improve Outcome: Progressing Goal: Mental status will improve Outcome: Progressing Goal: Verbalization of understanding the information provided will improve Outcome: Progressing   Problem: Activity: Goal: Interest or engagement in activities will improve Outcome: Progressing Goal: Sleeping patterns will improve Outcome: Progressing   Problem: Coping: Goal: Ability to verbalize frustrations and anger appropriately will improve Outcome: Progressing Goal: Ability to demonstrate self-control will improve Outcome: Progressing   Problem: Health Behavior/Discharge Planning: Goal: Identification of resources available to assist in meeting health care needs will improve Outcome: Progressing Goal: Compliance with treatment plan for underlying cause of condition will improve Outcome: Progressing   Problem: Physical Regulation: Goal: Ability to maintain clinical measurements within normal limits will improve Outcome: Progressing   Problem: Safety: Goal: Periods of time without injury will increase Outcome: Progressing   Problem: Education: Goal: Knowledge of General Education information will improve Description: Including pain rating scale, medication(s)/side effects and non-pharmacologic comfort measures Outcome: Progressing   Problem: Health Behavior/Discharge Planning: Goal: Ability to manage health-related needs will improve Outcome: Progressing   Problem: Clinical Measurements: Goal: Ability to maintain clinical measurements within normal limits will improve Outcome: Progressing Goal: Will remain free from infection Outcome: Progressing Goal: Diagnostic test results will improve Outcome: Progressing Goal: Respiratory complications will improve Outcome: Progressing Goal: Cardiovascular complication will be  avoided Outcome: Progressing   Problem: Activity: Goal: Risk for activity intolerance will decrease Outcome: Progressing   Problem: Nutrition: Goal: Adequate nutrition will be maintained Outcome: Progressing   Problem: Coping: Goal: Level of anxiety will decrease Outcome: Progressing   Problem: Elimination: Goal: Will not experience complications related to bowel motility Outcome: Progressing Goal: Will not experience complications related to urinary retention Outcome: Progressing   Problem: Pain Managment: Goal: General experience of comfort will improve Outcome: Progressing   Problem: Safety: Goal: Ability to remain free from injury will improve Outcome: Progressing   Problem: Skin Integrity: Goal: Risk for impaired skin integrity will decrease Outcome: Progressing

## 2022-12-09 NOTE — Group Note (Signed)
LCSW Group Therapy Note  Group Date: 12/09/2022 Start Time: 1320 End Time: 1420   Type of Therapy and Topic:  Group Therapy -  Coping Skills  Participation Level:  Minimal   Description of Group The focus of this group was to determine what unhealthy coping techniques typically are used by group members and what healthy coping techniques would be helpful in coping with various problems. Patients were guided in becoming aware of the differences between healthy and unhealthy coping techniques. Patients were asked to identify 2-3 healthy coping skills they would like to learn to use more effectively.  Therapeutic Goals Patients learned that coping is what human beings do all day long to deal with various situations in their lives Patients defined and discussed healthy vs unhealthy coping techniques Patients identified their preferred coping techniques and identified whether these were healthy or unhealthy Patients determined 2-3 healthy coping skills they would like to become more familiar with and use more often. Patients provided support and ideas to each other   Summary of Patient Progress:  The patient attended group. The patient proved open to input from peers and feedback from [facility]. Patient demonstrated insight into the subject matter, was respectful of peers, and participated throughout the entire session. The patient stated that he like to exercise and uses work as a way to distract himself from his mind.    Therapeutic Modalities Cognitive Behavioral Therapy Motivational Interviewing  Tama Headings 12/09/2022  3:09 PM

## 2022-12-09 NOTE — Plan of Care (Signed)
Problem: Education: Goal: Knowledge of [facility] Education information/materials will improve 12/09/2022 1753 by Delos Haring, RN Outcome: Progressing 12/09/2022 1107 by Delos Haring, RN Outcome: Progressing Goal: Emotional status will improve 12/09/2022 1753 by Delos Haring, RN Outcome: Progressing 12/09/2022 1107 by Delos Haring, RN Outcome: Progressing Goal: Mental status will improve 12/09/2022 1753 by Delos Haring, RN Outcome: Progressing 12/09/2022 1107 by Delos Haring, RN Outcome: Progressing Goal: Verbalization of understanding the information provided will improve 12/09/2022 1753 by Delos Haring, RN Outcome: Progressing 12/09/2022 1107 by Delos Haring, RN Outcome: Progressing   Problem: Activity: Goal: Interest or engagement in activities will improve 12/09/2022 1753 by Delos Haring, RN Outcome: Progressing 12/09/2022 1107 by Delos Haring, RN Outcome: Progressing Goal: Sleeping patterns will improve 12/09/2022 1753 by Delos Haring, RN Outcome: Progressing 12/09/2022 1107 by Delos Haring, RN Outcome: Progressing   Problem: Coping: Goal: Ability to verbalize frustrations and anger appropriately will improve 12/09/2022 1753 by Delos Haring, RN Outcome: Progressing 12/09/2022 1107 by Delos Haring, RN Outcome: Progressing Goal: Ability to demonstrate self-control will improve 12/09/2022 1753 by Delos Haring, RN Outcome: Progressing 12/09/2022 1107 by Delos Haring, RN Outcome: Progressing   Problem: Health Behavior/Discharge Planning: Goal: Identification of resources available to assist in meeting health care needs will improve 12/09/2022 1753 by Delos Haring, RN Outcome: Progressing 12/09/2022 1107 by Delos Haring, RN Outcome: Progressing Goal: Compliance with treatment plan for underlying cause of condition will improve 12/09/2022 1753 by Delos Haring, RN Outcome: Progressing 12/09/2022 1107 by Delos Haring, RN Outcome: Progressing   Problem: Physical Regulation: Goal: Ability to maintain clinical measurements within normal limits will improve 12/09/2022 1753 by Delos Haring, RN Outcome: Progressing 12/09/2022 1107 by Delos Haring, RN Outcome: Progressing   Problem: Safety: Goal: Periods of time without injury will increase 12/09/2022 1753 by Delos Haring, RN Outcome: Progressing 12/09/2022 1107 by Delos Haring, RN Outcome: Progressing   Problem: Education: Goal: Knowledge of General Education information will improve Description: Including pain rating scale, medication(s)/side effects and non-pharmacologic comfort measures 12/09/2022 1753 by Delos Haring, RN Outcome: Progressing 12/09/2022 1107 by Delos Haring, RN Outcome: Progressing   Problem: Health Behavior/Discharge Planning: Goal: Ability to manage health-related needs will improve 12/09/2022 1753 by Delos Haring, RN Outcome: Progressing 12/09/2022 1107 by Delos Haring, RN Outcome: Progressing   Problem: Clinical Measurements: Goal: Ability to maintain clinical measurements within normal limits will improve 12/09/2022 1753 by Delos Haring, RN Outcome: Progressing 12/09/2022 1107 by Delos Haring, RN Outcome: Progressing Goal: Will remain free from infection 12/09/2022 1753 by Delos Haring, RN Outcome: Progressing 12/09/2022 1107 by Delos Haring, RN Outcome: Progressing Goal: Diagnostic test results will improve 12/09/2022 1753 by Delos Haring, RN Outcome: Progressing 12/09/2022 1107 by Delos Haring, RN Outcome: Progressing Goal: Respiratory complications will improve 12/09/2022 1753 by Delos Haring, RN Outcome: Progressing 12/09/2022 1107 by Delos Haring, RN Outcome: Progressing Goal: Cardiovascular complication will be avoided 12/09/2022 1753 by Delos Haring, RN Outcome: Progressing 12/09/2022 1107 by Delos Haring, RN Outcome: Progressing   Problem: Activity: Goal:  Risk for activity intolerance will decrease 12/09/2022 1753 by Delos Haring, RN Outcome: Progressing 12/09/2022 1107 by Delos Haring, RN Outcome: Progressing   Problem: Nutrition: Goal: Adequate nutrition will be maintained 12/09/2022 1753 by Delos Haring, RN Outcome: Progressing 12/09/2022 1107 by  Delos Haring, RN Outcome: Progressing   Problem: Coping: Goal: Level of anxiety will decrease 12/09/2022 1753 by Delos Haring, RN Outcome: Progressing 12/09/2022 1107 by Delos Haring, RN Outcome: Progressing   Problem: Elimination: Goal: Will not experience complications related to bowel motility 12/09/2022 1753 by Delos Haring, RN Outcome: Progressing 12/09/2022 1107 by Delos Haring, RN Outcome: Progressing Goal: Will not experience complications related to urinary retention 12/09/2022 1753 by Delos Haring, RN Outcome: Progressing 12/09/2022 1107 by Delos Haring, RN Outcome: Progressing   Problem: Pain Managment: Goal: General experience of comfort will improve 12/09/2022 1753 by Delos Haring, RN Outcome: Progressing 12/09/2022 1107 by Delos Haring, RN Outcome: Progressing   Problem: Safety: Goal: Ability to remain free from injury will improve 12/09/2022 1753 by Delos Haring, RN Outcome: Progressing 12/09/2022 1107 by Delos Haring, RN Outcome: Progressing   Problem: Skin Integrity: Goal: Risk for impaired skin integrity will decrease 12/09/2022 1753 by Delos Haring, RN Outcome: Progressing 12/09/2022 1107 by Delos Haring, RN Outcome: Progressing

## 2022-12-09 NOTE — Progress Notes (Addendum)
Nursing Shift Note:  1900-0700  Attended Evening Group: yes Medication Compliant: yes Behavior: cooperative and pleasant Sleep Quality: good Significant Changes: overall improvements  The patient remained visible in the milieu and was pleasant upon contact.  His mood was stable with a flat affect.  No unusual or bizarre behaviors observed.      Shane Small endorsed severe levels of anxiety and symptoms of depression on his Patient Self-Inventory.  He denied thoughts of harming himself and others.

## 2022-12-09 NOTE — Progress Notes (Signed)
D- Patient alert and oriented x 4. Affect anxious/mood preoccupied and anxious. Denies SI/ HI/ AVH. Patient denies pain. Patient endorses depression and anxiety. PRN Hydroxyzine 50 mg administered for complaints of anxiety with fair results. A- Scheduled medications administered to patient, per MD orders. Support and encouragement provided.  Routine safety checks conducted every 15 minutes without incident.  Patient informed to notify staff with problems or concerns and verbalizes understanding. R- No adverse drug reactions noted.  Patient compliant with medications and treatment plan. Patient receptive, calm and cooperative. He participated in outside activity and Social work group. Plans for discharge tomorrow to [facility].  Patient contracts for safety and  remains safe on the unit at this time.

## 2022-12-09 NOTE — Group Note (Signed)
Date:  12/09/2022 Time:  11:32 AM  Group Topic/Focus:  Activity/Outdoor Recreation     Participation Level:  Did Not Attend   Lynelle Smoke [facility] 12/09/2022, 11:32 AM

## 2022-12-09 NOTE — Group Note (Signed)
Date:  12/09/2022 Time:  11:21 AM  Group Topic/Focus:  Goals Group:   The focus of this group is to help patients establish daily goals to achieve during treatment and discuss how the patient can incorporate goal setting into their daily lives to aide in recovery.    Participation Level:  Did Not Attend   Lynelle Smoke [facility] 12/09/2022, 11:21 AM

## 2022-12-10 ENCOUNTER — Emergency Department (HOSPITAL_COMMUNITY)
Admission: EM | Admit: 2022-12-10 | Discharge: 2022-12-11 | Disposition: A | Discharge status: Home / Self Care | Payer: MEDICAID | Attending: Emergency Medicine | Admitting: Emergency Medicine

## 2022-12-10 ENCOUNTER — Encounter (HOSPITAL_COMMUNITY): Payer: Self-pay | Admitting: Emergency Medicine

## 2022-12-10 ENCOUNTER — Other Ambulatory Visit: Payer: Self-pay

## 2022-12-10 DIAGNOSIS — F200 Paranoid schizophrenia: Secondary | ICD-10-CM | POA: Diagnosis not present

## 2022-12-10 DIAGNOSIS — J3489 Other specified disorders of nose and nasal sinuses: Secondary | ICD-10-CM

## 2022-12-10 MED ORDER — MIRTAZAPINE 15 MG PO TABS: 15.0000 mg | ORAL_TABLET | Freq: Every day | ORAL | 0 refills | Status: AC

## 2022-12-10 MED ORDER — QUETIAPINE FUMARATE 50 MG PO TABS: 50.0000 mg | ORAL_TABLET | Freq: Every day | ORAL | 0 refills | Status: AC

## 2022-12-10 MED ORDER — HYDROXYZINE HCL 50 MG PO TABS: 50.0000 mg | ORAL_TABLET | Freq: Every day | ORAL | 0 refills | Status: AC | PRN

## 2022-12-10 MED ORDER — ALBUTEROL SULFATE HFA 108 (90 BASE) MCG/ACT IN AERS: 2.0000 | INHALATION_SPRAY | RESPIRATORY_TRACT | 0 refills | Status: DC | PRN

## 2022-12-10 MED ORDER — PAROXETINE HCL 20 MG PO TABS: 20.0000 mg | ORAL_TABLET | Freq: Every day | ORAL | 0 refills | Status: AC

## 2022-12-10 NOTE — BH IP Treatment Plan (Signed)
Interdisciplinary Treatment and Diagnostic Plan Update  12/10/2022 Time of Session: 9:00AM Shane Small MRN: [id number]  Principal Diagnosis: Schizophrenia, paranoid (HCC)  Secondary Diagnoses: Principal Problem:   Schizophrenia, paranoid (HCC) Active Problems:   Methamphetamine use (HCC)   Current Medications:  Current Facility-Administered Medications  Medication Dose Route Frequency Provider Last Rate Last Admin   acetaminophen (TYLENOL) tablet 650 mg  650 mg Oral Q6H PRN Sunday Corn, NP       albuterol (VENTOLIN HFA) 108 (90 Base) MCG/ACT inhaler 2 puff  2 puff Inhalation Q4H PRN Sarina Ill, DO   2 puff at 12/10/22 0937   alum & mag hydroxide-simeth (MAALOX/MYLANTA) 200-200-20 MG/5ML suspension 30 mL  30 mL Oral Q4H PRN Sunday Corn, NP       diphenhydrAMINE (BENADRYL) capsule 50 mg  50 mg Oral TID PRN Sunday Corn, NP       Or   diphenhydrAMINE (BENADRYL) injection 50 mg  50 mg Intramuscular TID PRN Sunday Corn, NP       haloperidol (HALDOL) tablet 5 mg  5 mg Oral TID PRN Sunday Corn, NP       Or   haloperidol lactate (HALDOL) injection 5 mg  5 mg Intramuscular TID PRN Sunday Corn, NP       hydrOXYzine (ATARAX) tablet 50 mg  50 mg Oral Daily PRN Charm Rings, NP   50 mg at 12/08/22 1832   hydrOXYzine (ATARAX) tablet 50 mg  50 mg Oral Daily Charm Rings, NP   50 mg at 12/10/22 0935   magnesium hydroxide (MILK OF MAGNESIA) suspension 30 mL  30 mL Oral Daily PRN Sunday Corn, NP       mirtazapine (REMERON) tablet 15 mg  15 mg Oral QHS Sarina Ill, DO   15 mg at 12/09/22 2134   OLANZapine (ZYPREXA) tablet 10 mg  10 mg Oral Q6H PRN Sarina Ill, DO       PARoxetine (PAXIL) tablet 20 mg  20 mg Oral Daily Sarina Ill, DO   20 mg at 12/10/22 0936   QUEtiapine (SEROQUEL) tablet 50 mg  50 mg Oral QHS Lauree Chandler, NP   50 mg at 12/09/22 2133   traZODone (DESYREL) tablet 50 mg  50 mg Oral  QHS PRN Sunday Corn, NP   50 mg at 12/09/22 2133   PTA Medications: Medications Prior to Admission  Medication Sig Dispense Refill Last Dose   diphenhydrAMINE (BENADRYL) 25 mg capsule Take 25 mg by mouth every 8 (eight) hours as needed for allergies or sleep.      Multiple Vitamin (MULTIVITAMIN WITH MINERALS) TABS tablet Take 1 tablet by mouth daily.       Patient Stressors: Medication change or noncompliance   Substance abuse    Patient Strengths: Motivation for treatment/growth  Supportive family/friends   Treatment Modalities: Medication Management, Group therapy, Case management,  1 to 1 session with clinician, Psychoeducation, Recreational therapy.   Physician Treatment Plan for Primary Diagnosis: Schizophrenia, paranoid (HCC) Long Term Goal(s): Improvement in symptoms so as ready for discharge   Short Term Goals: Ability to identify changes in lifestyle to reduce recurrence of condition will improve  Medication Management: Evaluate patient's response, side effects, and tolerance of medication regimen.  Therapeutic Interventions: 1 to 1 sessions, Unit Group sessions and Medication administration.  Evaluation of Outcomes: Adequate for Discharge  Physician Treatment Plan for Secondary Diagnosis: Principal Problem:   Schizophrenia, paranoid (HCC)  Active Problems:   Methamphetamine use (HCC)  Long Term Goal(s): Improvement in symptoms so as ready for discharge   Short Term Goals: Ability to identify changes in lifestyle to reduce recurrence of condition will improve     Medication Management: Evaluate patient's response, side effects, and tolerance of medication regimen.  Therapeutic Interventions: 1 to 1 sessions, Unit Group sessions and Medication administration.  Evaluation of Outcomes: Adequate for Discharge   RN Treatment Plan for Primary Diagnosis: Schizophrenia, paranoid (HCC) Long Term Goal(s): Knowledge of disease and therapeutic regimen to maintain  health will improve  Short Term Goals: Ability to demonstrate self-control, Ability to participate in decision making will improve, Ability to verbalize feelings will improve, Ability to disclose and discuss suicidal ideas, Ability to identify and develop effective coping behaviors will improve, and Compliance with prescribed medications will improve  Medication Management: RN will administer medications as ordered by provider, will assess and evaluate patient's response and provide education to patient for prescribed medication. RN will report any adverse and/or side effects to prescribing provider.  Therapeutic Interventions: 1 on 1 counseling sessions, Psychoeducation, Medication administration, Evaluate responses to treatment, Monitor vital signs and CBGs as ordered, Perform/monitor CIWA, COWS, AIMS and Fall Risk screenings as ordered, Perform wound care treatments as ordered.  Evaluation of Outcomes: Adequate for Discharge   LCSW Treatment Plan for Primary Diagnosis: Schizophrenia, paranoid (HCC) Long Term Goal(s): Safe transition to appropriate next level of care at discharge, Engage patient in therapeutic group addressing interpersonal concerns.  Short Term Goals: Engage patient in aftercare planning with referrals and resources, Increase social support, Increase ability to appropriately verbalize feelings, Increase emotional regulation, Facilitate acceptance of mental health diagnosis and concerns, and Increase skills for wellness and recovery  Therapeutic Interventions: Assess for all discharge needs, 1 to 1 time with Social worker, Explore available resources and support systems, Assess for adequacy in community support network, Educate family and significant other(s) on suicide prevention, Complete Psychosocial Assessment, Interpersonal group therapy.  Evaluation of Outcomes: Adequate for Discharge   Progress in Treatment: Attending groups: No. Participating in groups: No. Taking  medication as prescribed: Yes. Toleration medication: Yes. Family/Significant other contact made: No, will contact:  once permission is given. Patient understands diagnosis: Yes. Discussing patient identified problems/goals with staff: Yes. Medical problems stabilized or resolved: Yes. Denies suicidal/homicidal ideation: Yes. Issues/concerns per patient self-inventory: No. Other: none  New problem(s) identified: No, Describe:  None Update 12/05/22: No changes at this time  Update 12/10/2022: No changes at this time.    New Short Term/Long Term Goal(s):  elimination of symptoms of psychosis, medication management for mood stabilization; elimination of SI thoughts; development of comprehensive mental wellness plan.  Update 12/05/22: No changes at this time Update 12/10/2022: No changes at this time.    Patient Goals:  "I don't know, I know I have a lot of stress going on and a lot of anxiety, depression"  Update 12/05/22: No changes at this time Update 12/10/2022: No changes at this time.    Discharge Plan or Barriers: CSW will assist with appropriate discharge planning  Update 12/05/22: No changes at this time Update 12/10/2022: Patient reports plans to go to the [facility] at discharge.  CSW has provided the patient with resources.  Patient also provided verbal permission for CSW to schedule patient with an aftercare appointment.    Reason for Continuation of Hospitalization: Anxiety Depression Medication stabilization   Estimated Length of Stay: 1 to 7 days  Update 12/05/22: No changes at  this time Update 12/10/2022: No changes at this time.     Last 3 [place] Suicide Severity Risk Score: Flowsheet Row Admission (Current) from 11/29/2022 in [facility] INPATIENT BEHAVIORAL MEDICINE Most recent reading at 11/29/2022  7:32 PM ED from 11/29/2022 in [facility] Most recent reading at 11/29/2022  2:52 PM ED from 08/22/2022 in [facility] Department at [facility] Most recent  reading at 08/22/2022  7:32 AM  C-SSRS RISK CATEGORY No Risk No Risk No Risk       Last PHQ 2/9 Scores:    01/07/2022   10:48 AM 04/08/2020   10:14 AM  Depression screen PHQ 2/9  Decreased Interest 0 2  Down, Depressed, Hopeless 1 1  PHQ - 2 Score 1 3  Altered sleeping 1 1  Tired, decreased energy 0 2  Change in appetite 0 2  Feeling bad or failure about yourself  1 1  Trouble concentrating 1 3  Moving slowly or fidgety/restless 0 1  Suicidal thoughts 0 0  PHQ-9 Score 4 13  Difficult doing work/chores Somewhat difficult Somewhat difficult    Scribe for Treatment Team: Harden Mo, LCSW 12/10/2022 1:52 PM

## 2022-12-10 NOTE — Progress Notes (Signed)
   12/10/22 1100  Psych Admission Type (Psych Patients Only)  Admission Status Involuntary  Psychosocial Assessment  Patient Complaints Anxiety  Eye Contact Brief  Facial Expression Flat  Affect Flat  Speech Slow  Interaction Assertive  Motor Activity Slow  Appearance/Hygiene Disheveled  Behavior Characteristics Cooperative  Mood Pleasant  Thought Process  Coherency Circumstantial  Content Preoccupation  Delusions None reported or observed  Perception WDL  Hallucination None reported or observed  Judgment Limited  Confusion None  Danger to Self  Current suicidal ideation? Denies  Agreement Not to Harm Self Yes  Danger to Others  Danger to Others None reported or observed   Patient discharged at this time via cab. All discharge instructions and prescriptions given to patient with understanding.

## 2022-12-10 NOTE — Progress Notes (Signed)
Suicide Risk Assessment  Discharge Assessment    [facility] Discharge Suicide Risk Assessment   Principal Problem: Schizophrenia, paranoid (HCC) Discharge Diagnoses: Principal Problem:   Schizophrenia, paranoid (HCC) Active Problems:   Methamphetamine use (HCC)   Total Time spent with patient: 30 minutes  Musculoskeletal: Strength & Muscle Tone: within normal limits Gait & Station: normal Patient leans: N/A  Psychiatric Specialty Exam  Presentation  General Appearance:  Appropriate for Environment; Fairly Groomed  Eye Contact: Fair  Speech: Clear and Coherent; Normal Rate  Speech Volume: Normal  Handedness: Right  Mood and Affect  Mood: Euthymic  Affect: Flat  Thought Process  Thought Processes: Coherent; Goal Directed; Linear  Descriptions of Associations:Intact  Orientation:Full (Time, Place and Person)  Thought Content:Logical  History of Schizophrenia/Schizoaffective disorder:Yes  Hallucinations:Hallucinations: None  Ideas of Reference:None  Suicidal Thoughts:Suicidal Thoughts: No  Homicidal Thoughts:Homicidal Thoughts: No   Sensorium  Memory: Immediate Fair  Judgment: Intact  Insight: Present   Executive Functions  Concentration: Fair  Attention Span: Fair  Recall: Fair  Fund of Knowledge: Fair  Language: Fair   Psychomotor Activity  Psychomotor Activity:Psychomotor Activity: Normal   Assets  Assets: Manufacturing systems engineer; Desire for Improvement; Financial Resources/Insurance; Resilience   Sleep  Sleep:Sleep: Fair   Physical Exam: Physical Exam see discharge ROS see discharge Blood pressure 110/70, pulse 84, temperature 97.6 F (36.4 C), temperature source Oral, resp. rate 18, height 5\' 11"  (1.803 m), weight 65.8 kg, SpO2 96%. Body mass index is 20.23 kg/m.  Mental Status Per Nursing Assessment::   On Admission:  NA  Demographic Factors:  Male, Low socioeconomic status, Living alone, and  Unemployed  Loss Factors: Financial problems/change in socioeconomic status  Historical Factors: NA  Risk Reduction Factors:   Positive coping skills or problem solving skills  Continued Clinical Symptoms:  Previous Psychiatric Diagnoses and Treatments  Cognitive Features That Contribute To Risk:  None    Suicide Risk:  Minimal: No identifiable suicidal ideation.  Patients presenting with no risk factors but with morbid ruminations; may be classified as minimal risk based on the severity of the depressive symptoms  Plan Of Care/Follow-up recommendations:  Follow up with outpatient psychiatry and counseling  Lauree Chandler, NP 12/10/2022, 9:33 AM
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
## 2022-12-10 NOTE — Progress Notes (Signed)
Nursing Shift Note:  1900-0700  Attended Evening Group: yes Medication Compliant: yes Behavior: cooperative and pleasant Sleep Quality: good Significant Changes: none  The patient was visible in the milieu and accepting of nighttime medications.  Endorsed anxiety pertaining to his ongoing housing issues. Mostly keeps to himself.

## 2022-12-10 NOTE — Discharge Summary (Signed)
Physician Discharge Summary Note  Patient:  Shane Small is an 37 y.o., male MRN:  [id number] DOB:  02-Aug-1985 Patient phone:  [phone] (home)  Patient address:   [address] [place] [address],  Total Time spent with patient: 30 minutes  Date of Admission:  11/29/2022 Date of Discharge: 12/10/2022  Reason for Admission:  37 y/o male admitted under IVC for psychosis.  Principal Problem: Schizophrenia, paranoid [facility]) Discharge Diagnoses: Principal Problem:   Schizophrenia, paranoid (HCC) Active Problems:   Methamphetamine use (HCC)  Subjective: Pt chart reviewed, discussed with interdisciplinary team, and seen on rounds. Endorses euthymic mood. Reports sleep and appetite are fair. Denies suicidal, homicidal ideations. Denies auditory visual hallucinations or paranoia. Confirmed discharge today and he is in agreement.  Past Psychiatric History: Patient reports a past psychiatric hospitalization at [facility] although he is unsure when this was. Per chart review, patient was admitted to [facility] in May 2018 and has also presented to [facility], [facility], and [facility] multiple times from 2018-2023 for mental health concerns.     Past Medical History:  Past Medical History:  Diagnosis Date   Anxiety    Asthma    Daily headache    Depression    Paranoid schizophrenia (HCC)    Schizophrenia (HCC)    Stroke (HCC)     Past Surgical History:  Procedure Laterality Date   NO PAST SURGERIES     Family History:  Family History  Problem Relation Age of Onset   Asthma Mother    Diabetes Father    Hypertension Father    Colon cancer Father 4   Family Psychiatric  History: None reported Social History:  Social History   Substance and Sexual Activity  Alcohol Use Yes   Alcohol/week: 56.0 standard drinks of alcohol   Types: 28 Cans of beer, 28 Shots of liquor per week   Comment: occasionally     Social History   Substance and Sexual Activity  Drug Use Yes   Comment: CBD     Social History   Socioeconomic History   Marital status: Single    Spouse name: Not on file   Number of children: 1   Years of education: Not on file   Highest education level: Not on file  Occupational History   Occupation: Location manager  Tobacco Use   Smoking status: Every Day    Types: Cigarettes   Smokeless tobacco: Never  Vaping Use   Vaping status: Former  Substance and Sexual Activity   Alcohol use: Yes    Alcohol/week: 56.0 standard drinks of alcohol    Types: 28 Cans of beer, 28 Shots of liquor per week    Comment: occasionally   Drug use: Yes    Comment: CBD   Sexual activity: Not on file  Other Topics Concern   Not on file  Social History Narrative   Not on file   Social Determinants of Health   Financial Resource Strain: Not on file  Food Insecurity: No Food Insecurity (11/29/2022)   Hunger Vital Sign    Worried About Running Out of Food in the Last Year: Never true    Ran Out of Food in the Last Year: Never true  Transportation Needs: Patient Declined (11/29/2022)   PRAPARE - Administrator, Civil Service (Medical): Patient declined    Lack of Transportation (Non-Medical): Patient declined  Physical Activity: Not on file  Stress: Not on file  Social Connections: Not on file  Hospital Course:  Shane Small was admitted for Schizophrenia, paranoid [facility]) and crisis management.  Shane Small was discharged with current medication and was instructed on how to take medications as prescribed; (details listed below under Medication List).  Medical problems were identified and treated as needed.  Home medications were restarted as appropriate.  Improvement was monitored by observation and Shane Small daily report of symptom reduction.  Emotional and mental status was monitored by daily self-inventory reports completed by Shane Small and clinical staff.         Shane Small was evaluated by the treatment team for stability and  plans for continued recovery upon discharge.  Shane Small motivation was an integral factor for scheduling further treatment.  Employment, transportation, bed availability, health status, family support, and any pending legal issues were also considered during his hospital stay.  He was offered further treatment options upon discharge including but not limited to Residential, Intensive Outpatient, and Outpatient treatment.  Shane Small will follow up with the services as listed below under Follow Up Information.     Upon completion of this admission the Shane Small was both mentally and medically stable for discharge denying suicidal/homicidal ideation, auditory/visual/tactile hallucinations, delusional thoughts and paranoia.     Physical Findings: AIMS:  , ,  ,  ,    CIWA:    COWS:     Musculoskeletal: Strength & Muscle Tone: within normal limits Gait & Station: normal Patient leans: N/A   Psychiatric Specialty Exam:  Presentation  General Appearance:  Appropriate for Environment; Fairly Groomed  Eye Contact: Fair  Speech: Clear and Coherent; Normal Rate  Speech Volume: Normal  Handedness: Right   Mood and Affect  Mood: Euthymic  Affect: Flat   Thought Process  Thought Processes: Coherent; Goal Directed; Linear  Descriptions of Associations:Intact  Orientation:Full (Time, Place and Person)  Thought Content:Logical  History of Schizophrenia/Schizoaffective disorder:Yes  Duration of Psychotic Symptoms:Greater than six months  Hallucinations:Hallucinations: None  Ideas of Reference:None  Suicidal Thoughts:Suicidal Thoughts: No  Homicidal Thoughts:Homicidal Thoughts: No   Sensorium  Memory: Immediate Fair  Judgment: Intact  Insight: Present   Executive Functions  Concentration: Fair  Attention Span: Fair  Recall: [employer] of Knowledge: Fair  Language: Fair   Psychomotor Activity  Psychomotor  Activity: Psychomotor Activity: Normal   Assets  Assets: Communication Skills; Desire for Improvement; Financial Resources/Insurance; Resilience   Sleep  Sleep: Sleep: Fair    Physical Exam: Physical Exam Constitutional:      General: He is not in acute distress.    Appearance: He is not ill-appearing, toxic-appearing or diaphoretic.  Eyes:     General: No scleral icterus. Cardiovascular:     Rate and Rhythm: Normal rate.  Pulmonary:     Effort: Pulmonary effort is normal. No respiratory distress.  Neurological:     Mental Status: He is alert and oriented to person, place, and time.  Psychiatric:        Attention and Perception: Attention and perception normal.        Mood and Affect: Mood normal. Affect is flat.        Speech: Speech normal.        Behavior: Behavior normal. Behavior is cooperative.        Thought Content: Thought content normal.        Cognition and Memory: Cognition and memory normal.        Judgment: Judgment normal.  Review of Systems  Constitutional:  Negative for chills and fever.  Respiratory:  Negative for shortness of breath.   Cardiovascular:  Negative for chest pain and palpitations.  Gastrointestinal:  Negative for abdominal pain.  Neurological:  Negative for headaches.   Blood pressure 110/70, pulse 84, temperature 97.6 F (36.4 C), temperature source Oral, resp. rate 18, height 5\' 11"  (1.803 m), weight 65.8 kg, SpO2 96%. Body mass index is 20.23 kg/m.   Social History   Tobacco Use  Smoking Status Every Day   Types: Cigarettes  Smokeless Tobacco Never   Tobacco Cessation:  A prescription for an FDA-approved tobacco cessation medication was offered at discharge and the patient refused   Blood Alcohol level:  Lab Results  Component Value Date   [facility] <10 11/29/2022   ETH <10 01/05/2022    Metabolic Disorder Labs:  Lab Results  Component Value Date   HGBA1C 4.6 (L) 11/29/2022   MPG 85.32 11/29/2022   MPG 73.84  01/05/2022   Lab Results  Component Value Date   PROLACTIN 5.2 11/29/2022   PROLACTIN 8.1 09/11/2016   Lab Results  Component Value Date   CHOL 201 (H) 11/29/2022   TRIG 69 11/29/2022   HDL 73 11/29/2022   CHOLHDL 2.8 11/29/2022   VLDL 14 11/29/2022   LDLCALC 114 (H) 11/29/2022   LDLCALC 98 01/05/2022    See Psychiatric Specialty Exam and Suicide Risk Assessment completed by Attending Physician prior to discharge.  Discharge destination:  Home  Is patient on multiple antipsychotic therapies at discharge:  No   Has Patient had three or more failed trials of antipsychotic monotherapy by history:  No  Recommended Plan for Multiple Antipsychotic Therapies: NA   Allergies as of 12/10/2022   No Known Allergies      Medication List     STOP taking these medications    diphenhydrAMINE 25 mg capsule Commonly known as: BENADRYL       TAKE these medications      Indication  albuterol 108 (90 Base) MCG/ACT inhaler Commonly known as: VENTOLIN HFA Inhale 2 puffs into the lungs every 4 (four) hours as needed for wheezing or shortness of breath.  Indication: Asthma   hydrOXYzine 50 MG tablet Commonly known as: ATARAX Take 1 tablet (50 mg total) by mouth daily as needed for anxiety.  Indication: Feeling Anxious   mirtazapine 15 MG tablet Commonly known as: REMERON Take 1 tablet (15 mg total) by mouth at bedtime.  Indication: Major Depressive Disorder   multivitamin with minerals Tabs tablet Take 1 tablet by mouth daily.  Indication: nutritional supplement   PARoxetine 20 MG tablet Commonly known as: PAXIL Take 1 tablet (20 mg total) by mouth daily.  Indication: Major Depressive Disorder   QUEtiapine 50 MG tablet Commonly known as: SEROQUEL Take 1 tablet (50 mg total) by mouth at bedtime.  Indication: Schizophrenia       Follow-up recommendations:   Follow up with outpatient psychiatry and counseling  Signed: Lauree Chandler, NP 12/10/2022, 9:36  AM

## 2022-12-10 NOTE — Group Note (Signed)
Recreation Therapy Group Note   Group Topic:General Recreation  Group Date: 12/10/2022 Start Time: 1015 End Time: 1120 Facilitators: Rosina Lowenstein, LRT, CTRS Location: Courtyard  Group Description: Outdoor Recreation. Patients had the option to play basketball, play with a deck of cards or sit and listen to music while outside in the courtyard getting fresh air and sunlight. LRT and pts discussed things that they enjoy doing in their free time outside of the hospital.   Goal Area(s) Addressed: Patient will identify leisure interests.  Patient will practice healthy decision making. Patient will engage in recreation activity.   Affect/Mood: Appropriate   Participation Level: Active and Engaged   Participation Quality: Independent   Behavior: Appropriate, Calm, and Cooperative   Speech/Thought Process: Coherent   Insight: Good   Judgement: Good   Modes of Intervention: Activity   Patient Response to Interventions:  Attentive, Engaged, Interested , and Receptive   Education Outcome:  Acknowledges education   Clinical Observations/Individualized Feedback: Shane Small was active in their participation of session activities and group discussion. Pt chose to play basketball and talk with LRT and peers while outside. Pt suggested we fill the birdbath outside with water. Pt had a bright affect and was very pleasant when interacting.    Plan: Continue to engage patient in RT group sessions 2-3x/week.   Rosina Lowenstein, LRT, CTRS 12/10/2022 11:58 AM

## 2022-12-10 NOTE — Group Note (Signed)
Date:  12/10/2022 Time:  10:11 AM  Group Topic/Focus:  Dimensions of Wellness:   The focus of this group is to introduce the topic of wellness and discuss the role each dimension of wellness plays in total health.    Participation Level:  Did Not Attend   Rosaura Carpenter 12/10/2022, 10:11 AM

## 2022-12-10 NOTE — Progress Notes (Addendum)
  [facility] Adult Case Management Discharge Plan :  Will you be returning to the same living situation after discharge:  No. Patient is going [facility]. At discharge, do you have transportation home?: Yes,  CSW to assist with transpiration needs. Do you have the ability to pay for your medications: Yes,   TR[employer]IUM TAILORED PLAN / TRILLIUM TAILORED PLAN  Release of information consent forms completed and in the chart;  Patient's signature needed at discharge.  Patient to Follow up at:  Follow-up Information     [facility] Follow up.   Why: Appointment is scheduled for 12:00PM on 12/14/2022. Contact information: [address]  [address] [place] [address] [phone]                 Next level of care provider has access to [facility] Link:no  Safety Planning and Suicide Prevention discussed: Yes,  SPE completed with the patient.  Patient declined collateral contact.     Has patient been referred to the Quitline?: Patient refused referral for treatment  Patient has been referred for addiction treatment: Patient refused referral for treatment.  Harden Mo, LCSW 12/10/2022, 2:07 PM

## 2022-12-10 NOTE — Group Note (Signed)
Date:  12/10/2022 Time:  6:15 AM  Group Topic/Focus:  Wrap-Up Group:   The focus of this group is to help patients review their daily goal of treatment and discuss progress on daily workbooks.    Participation Level:  Did Not Attend   Additional Comments:  pt sat in caf room while we did our activity   Maglione,Emmaus Brandi E 12/10/2022, 6:15 AM

## 2022-12-11 ENCOUNTER — Emergency Department (HOSPITAL_COMMUNITY): Payer: MEDICAID

## 2022-12-11 MED ORDER — ACETAMINOPHEN 325 MG PO TABS
650.0000 mg | ORAL_TABLET | Freq: Once | ORAL | Status: AC
  Administered 2022-12-11: 650 mg via ORAL
  Filled 2022-12-11: qty 2
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
## 2022-12-11 NOTE — Discharge Instructions (Signed)
Your x-rays were reassuring with no signs of fracture. You may take acetaminophen or ibuprofen for pain as needed.

## 2022-12-12 ENCOUNTER — Emergency Department (HOSPITAL_COMMUNITY)
Admission: EM | Admit: 2022-12-12 | Discharge: 2022-12-12 | Disposition: A | Discharge status: Home / Self Care | Payer: MEDICAID | Attending: Emergency Medicine | Admitting: Emergency Medicine

## 2022-12-12 ENCOUNTER — Encounter (HOSPITAL_COMMUNITY): Payer: Self-pay

## 2022-12-12 ENCOUNTER — Other Ambulatory Visit: Payer: Self-pay

## 2022-12-12 DIAGNOSIS — R21 Rash and other nonspecific skin eruption: Secondary | ICD-10-CM | POA: Diagnosis present

## 2022-12-12 DIAGNOSIS — J45909 Unspecified asthma, uncomplicated: Secondary | ICD-10-CM | POA: Diagnosis not present

## 2022-12-12 DIAGNOSIS — L259 Unspecified contact dermatitis, unspecified cause: Secondary | ICD-10-CM | POA: Diagnosis not present

## 2022-12-12 DIAGNOSIS — L568 Other specified acute skin changes due to ultraviolet radiation: Secondary | ICD-10-CM | POA: Diagnosis not present

## 2022-12-12 DIAGNOSIS — X32XXXA Exposure to sunlight, initial encounter: Secondary | ICD-10-CM | POA: Insufficient documentation

## 2022-12-12 MED ORDER — HYDROCORTISONE 1 % EX CREA: TOPICAL_CREAM | Freq: Once | CUTANEOUS | Status: DC

## 2022-12-12 MED ORDER — TRIAMCINOLONE ACETONIDE 0.1 % EX CREA
TOPICAL_CREAM | Freq: Once | CUTANEOUS | Status: AC
  Filled 2022-12-12: qty 15

## 2022-12-12 MED ORDER — TRIAMCINOLONE ACETONIDE 0.1 % EX CREA: 1.0000 | TOPICAL_CREAM | Freq: Two times a day (BID) | CUTANEOUS | 0 refills | Status: AC
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
## 2022-12-12 NOTE — Discharge Instructions (Addendum)
As discussed, the rash on your arm is most likely a contact dermatitis. Apply Triamcinolone cream to the rash on your right  arm twice a day for the next 7 days.  You can also take oral antihistamine like Zyrtec or Claritin help with the itching and inflammation as well. As for the new skin burning sensation, your medication Mirtazapine has a side effect of photosensitivity, which can cause that feeling. Please contact the [facility] who put you on this medication, if you would like to be switched to a different one.  I have sent a referral so you can get established with a PCP. The office should call you in the next couple days to schedule an appointment.  Get help right away if: You suddenly develop a rash as well as: Swelling around your eyes or lips. Trouble swallowing. Trouble breathing. You have bleeding underneath skin that was exposed to the sun. You have pus or fluid coming from any blisters.

## 2022-12-22 ENCOUNTER — Encounter (HOSPITAL_COMMUNITY): Payer: Self-pay

## 2022-12-22 ENCOUNTER — Emergency Department (HOSPITAL_COMMUNITY)
Admission: EM | Admit: 2022-12-22 | Discharge: 2022-12-22 | Disposition: A | Discharge status: Home / Self Care | Payer: MEDICAID | Attending: Emergency Medicine | Admitting: Emergency Medicine

## 2022-12-22 ENCOUNTER — Other Ambulatory Visit: Payer: Self-pay

## 2022-12-22 DIAGNOSIS — S91311A Laceration without foreign body, right foot, initial encounter: Secondary | ICD-10-CM | POA: Diagnosis not present

## 2022-12-22 DIAGNOSIS — W268XXA Contact with other sharp object(s), not elsewhere classified, initial encounter: Secondary | ICD-10-CM | POA: Diagnosis not present

## 2022-12-22 DIAGNOSIS — S99921A Unspecified injury of right foot, initial encounter: Secondary | ICD-10-CM | POA: Diagnosis present

## 2022-12-22 MED ORDER — BACITRACIN ZINC 500 UNIT/GM EX OINT
TOPICAL_OINTMENT | Freq: Once | CUTANEOUS | Status: AC
  Administered 2022-12-22: 1 via TOPICAL
  Filled 2022-12-22: qty 0.9

## 2022-12-22 MED ORDER — CEPHALEXIN 500 MG PO CAPS
1000.0000 mg | ORAL_CAPSULE | Freq: Once | ORAL | Status: AC
  Administered 2022-12-22: 1000 mg via ORAL
  Filled 2022-12-22: qty 2

## 2022-12-22 MED ORDER — CEPHALEXIN 500 MG PO CAPS: ORAL_CAPSULE | ORAL | 0 refills | Status: AC

## 2022-12-22 NOTE — ED Provider Notes (Signed)
[place] EMERGENCY DEPARTMENT AT [facility] Provider Note   CSN: [id number] Arrival date & time: 12/22/22  4098     History  Chief Complaint  Patient presents with   Foot Injury    Shane Small is a 37 y.o. male.  37 yo M with a chief complaint of a cut to the bottom of his right foot.  He noticed this about a week or so ago.  He thinks that it closed and then may be reopened.  Has been walking in slippers because he has no other foot wear.  Denies redness denies drainage denies fevers.  He thinks his tetanus is up-to-date.   Foot Injury      Home Medications Prior to Admission medications   Medication Sig Start Date End Date Taking? Authorizing Provider  cephALEXin (KEFLEX) 500 MG capsule 2 caps po bid x 7 days 12/22/22  Yes Melene Plan, DO  albuterol (VENTOLIN HFA) 108 (90 Base) MCG/ACT inhaler Inhale 2 puffs into the lungs every 4 (four) hours as needed for wheezing or shortness of breath. 12/10/22 01/09/23  Lauree Chandler, NP  hydrOXYzine (ATARAX) 50 MG tablet Take 1 tablet (50 mg total) by mouth daily as needed for anxiety. 12/10/22   Lauree Chandler, NP  mirtazapine (REMERON) 15 MG tablet Take 1 tablet (15 mg total) by mouth at bedtime. 12/10/22 01/09/23  Lauree Chandler, NP  Multiple Vitamin (MULTIVITAMIN WITH MINERALS) TABS tablet Take 1 tablet by mouth daily.    [provider]  PARoxetine (PAXIL) 20 MG tablet Take 1 tablet (20 mg total) by mouth daily. 12/10/22 01/09/23  Lauree Chandler, NP  QUEtiapine (SEROQUEL) 50 MG tablet Take 1 tablet (50 mg total) by mouth at bedtime. 12/10/22 01/09/23  Lauree Chandler, NP  atomoxetine (STRATTERA) 10 MG capsule Take 10 mg by mouth daily.  10/31/19  [provider]  omeprazole (PRILOSEC) 20 MG capsule Take 1 capsule (20 mg total) by mouth 2 (two) times daily before a meal. 09/19/19 10/31/19  Darr, Gerilyn Pilgrim, PA-C      Allergies    Patient has no known allergies.    Review of Systems    Review of Systems  Physical Exam Updated Vital Signs BP (!) 152/96   Pulse 93   Temp 98.9 F (37.2 C)   Resp 15   Ht 5\' 11"  (1.803 m)   Wt 75.3 kg   SpO2 98%   BMI 23.15 kg/m  Physical Exam Vitals and nursing note reviewed.  Constitutional:      Appearance: He is well-developed.  HENT:     Head: Normocephalic and atraumatic.  Eyes:     Pupils: Pupils are equal, round, and reactive to light.  Neck:     Vascular: No JVD.  Cardiovascular:     Rate and Rhythm: Normal rate and regular rhythm.     Heart sounds: No murmur heard.    No friction rub. No gallop.  Pulmonary:     Effort: No respiratory distress.     Breath sounds: No wheezing.  Abdominal:     General: There is no distension.     Tenderness: There is no abdominal tenderness. There is no guarding or rebound.  Musculoskeletal:        General: Normal range of motion.     Cervical back: Normal range of motion and neck supple.     Comments: Small split of the skin at the bottom of the foot.  No surrounding erythema no  induration no fluctuance  Skin:    Coloration: Skin is not pale.     Findings: No rash.  Neurological:     Mental Status: He is alert and oriented to person, place, and time.  Psychiatric:        Behavior: Behavior normal.     ED Results / Procedures / Treatments   Labs (all labs ordered are listed, but only abnormal results are displayed) Labs Reviewed - No data to display  EKG None  Radiology No results found.  Procedures Procedures    Medications Ordered in ED Medications  bacitracin ointment (has no administration in time range)  cephALEXin (KEFLEX) capsule 1,000 mg (has no administration in time range)    ED Course/ Medical Decision Making/ A&P                                 Medical Decision Making Risk OTC drugs. Prescription drug management.   37 yo M with a chief complaints of a cut to the bottom of his foot.  That has been there for at least a week he thinks.   Wound appears fairly clean however the patient is homeless and only has a pair of old slippers to walk-in.  I feel he is at higher risk for infection.  Will start on prophylactic antibiotics.  I reviewed the patient's chart and his tetanus was updated last September.  9:32 AM:  I have discussed the diagnosis/risks/treatment options with the patient.  Evaluation and diagnostic testing in the emergency department does not suggest an emergent condition requiring admission or immediate intervention beyond what has been performed at this time.  They will follow up with PCP. We also discussed returning to the ED immediately if new or worsening sx occur. We discussed the sx which are most concerning (e.g., sudden worsening pain, fever, inability to tolerate by mouth) that necessitate immediate return. Medications administered to the patient during their visit and any new prescriptions provided to the patient are listed below.  Medications given during this visit Medications  bacitracin ointment (has no administration in time range)  cephALEXin (KEFLEX) capsule 1,000 mg (has no administration in time range)     The patient appears reasonably screen and/or stabilized for discharge and I doubt any other medical condition or other [facility] requiring further screening, evaluation, or treatment in the ED at this time prior to discharge.          Final Clinical Impression(s) / ED Diagnoses Final diagnoses:  Foot laceration, right, initial encounter    Rx / DC Orders ED Discharge Orders          Ordered    cephALEXin (KEFLEX) 500 MG capsule        12/22/22 0926              Melene Plan, DO 12/22/22 7829

## 2022-12-22 NOTE — ED Triage Notes (Signed)
Pt coming in today complaining of a small cut to the bottom of the right foot under toes. Would appears red but no discharge noted.

## 2022-12-22 NOTE — Discharge Instructions (Addendum)
Try to this with soap and water at least once a day and up to twice a day.  Apply an ointment to it twice a day as well. Return for rapid redness or swelling

## 2022-12-23 NOTE — Progress Notes (Deleted)
New Patient Office Visit  Subjective    Patient ID: Shane Small, male    DOB: 02-May-1985  Age: 37 y.o. MRN: [id number]  CC: No chief complaint on file.   HPI Shane Small presents to establish care Date of Admission:  11/29/2022 Date of Discharge: 12/10/2022   Reason for Admission:  37 y/o male admitted under IVC for psychosis.   Principal Problem: Schizophrenia, paranoid [facility]) Discharge Diagnoses: Principal Problem:   Schizophrenia, paranoid (HCC) Active Problems:   Methamphetamine use (HCC)   Subjective: Pt chart reviewed, discussed with interdisciplinary team, and seen on rounds. Endorses euthymic mood. Reports sleep and appetite are fair. Denies suicidal, homicidal ideations. Denies auditory visual hallucinations or paranoia. Confirmed discharge today and he is in agreement.   Past Psychiatric History: Patient reports a past psychiatric hospitalization at [facility] although he is unsure when this was. Per chart review, patient was admitted to [facility] in May 2018 and has also presented to [facility], [facility], and [facility] multiple times from 2018-2023 for mental health concerns.        Hospital Course:  Shane Small was admitted for Schizophrenia, paranoid [facility]) and crisis management.  Shane Small was discharged with current medication and was instructed on how to take medications as prescribed; (details listed below under Medication List).  Medical problems were identified and treated as needed.  Home medications were restarted as appropriate.   Improvement was monitored by observation and Shane Small daily report of symptom reduction.  Emotional and mental status was monitored by daily self-inventory reports completed by Shane Small and clinical staff.         Shane Small was evaluated by the treatment team for stability and plans for continued recovery upon discharge.  Shane Small motivation was an integral factor for scheduling further treatment.   Employment, transportation, bed availability, health status, family support, and any pending legal issues were also considered during his hospital stay.  He was offered further treatment options upon discharge including but not limited to Residential, Intensive Outpatient, and Outpatient treatment.  Shane Small will follow up with the services as listed below under Follow Up Information.      Upon completion of this admission the Shane Small was both mentally and medically stable for discharge denying suicidal/homicidal ideation, auditory/visual/tactile hallucinat  Outpatient Encounter Medications as of 12/25/2022  Medication Sig   albuterol (VENTOLIN HFA) 108 (90 Base) MCG/ACT inhaler Inhale 2 puffs into the lungs every 4 (four) hours as needed for wheezing or shortness of breath.   cephALEXin (KEFLEX) 500 MG capsule 2 caps po bid x 7 days   hydrOXYzine (ATARAX) 50 MG tablet Take 1 tablet (50 mg total) by mouth daily as needed for anxiety.   mirtazapine (REMERON) 15 MG tablet Take 1 tablet (15 mg total) by mouth at bedtime.   Multiple Vitamin (MULTIVITAMIN WITH MINERALS) TABS tablet Take 1 tablet by mouth daily.   PARoxetine (PAXIL) 20 MG tablet Take 1 tablet (20 mg total) by mouth daily.   QUEtiapine (SEROQUEL) 50 MG tablet Take 1 tablet (50 mg total) by mouth at bedtime.   [DISCONTINUED] atomoxetine (STRATTERA) 10 MG capsule Take 10 mg by mouth daily.   [DISCONTINUED] omeprazole (PRILOSEC) 20 MG capsule Take 1 capsule (20 mg total) by mouth 2 (two) times daily before a meal.   No facility-administered encounter medications on file as of 12/25/2022.    Past Medical History:  Diagnosis Date  Anxiety    Asthma    Daily headache    Depression    Paranoid schizophrenia (HCC)    Schizophrenia (HCC)    Stroke [facility])     Past Surgical History:  Procedure Laterality Date   NO PAST SURGERIES      Family History  Problem Relation Age of Onset   Asthma Mother    Diabetes Father     Hypertension Father    Colon cancer Father 62    Social History   Socioeconomic History   Marital status: Single    Spouse name: Not on file   Number of children: 1   Years of education: Not on file   Highest education level: Not on file  Occupational History   Occupation: Location manager  Tobacco Use   Smoking status: Every Day    Types: Cigarettes   Smokeless tobacco: Never  Vaping Use   Vaping status: Former  Substance and Sexual Activity   Alcohol use: Yes    Alcohol/week: 56.0 standard drinks of alcohol    Types: 28 Cans of beer, 28 Shots of liquor per week    Comment: occasionally   Drug use: Yes    Comment: CBD   Sexual activity: Not on file  Other Topics Concern   Not on file  Social History Narrative   Not on file   Social Determinants of Health   Financial Resource Strain: Not on file  Food Insecurity: No Food Insecurity (11/29/2022)   Hunger Vital Sign    Worried About Running Out of Food in the Last Year: Never true    Ran Out of Food in the Last Year: Never true  Transportation Needs: Patient Declined (11/29/2022)   PRAPARE - Administrator, Civil Service (Medical): Patient declined    Lack of Transportation (Non-Medical): Patient declined  Physical Activity: Not on file  Stress: Not on file  Social Connections: Not on file  Intimate Partner Violence: Not At Risk (11/29/2022)   Humiliation, Afraid, Rape, and Kick questionnaire    Fear of Current or Ex-Partner: No    Emotionally Abused: No    Physically Abused: No    Sexually Abused: No    ROS      Objective    There were no vitals taken for this visit.  Physical Exam  {Labs (Optional):23779}    Assessment & Plan:   Problem List Items Addressed This Visit   None   No follow-ups on file.   Shan Levans, MD

## 2022-12-24 ENCOUNTER — Other Ambulatory Visit: Payer: Self-pay

## 2022-12-24 ENCOUNTER — Emergency Department (HOSPITAL_COMMUNITY)
Admission: EM | Admit: 2022-12-24 | Discharge: 2022-12-24 | Disposition: A | Discharge status: Home / Self Care | Payer: MEDICAID | Attending: Emergency Medicine | Admitting: Emergency Medicine

## 2022-12-24 ENCOUNTER — Encounter (HOSPITAL_COMMUNITY): Payer: Self-pay | Admitting: Emergency Medicine

## 2022-12-24 DIAGNOSIS — Z760 Encounter for issue of repeat prescription: Secondary | ICD-10-CM | POA: Insufficient documentation

## 2022-12-24 MED ORDER — QUETIAPINE FUMARATE 50 MG PO TABS: 50.0000 mg | ORAL_TABLET | Freq: Every day | ORAL | 0 refills | Status: AC

## 2022-12-24 MED ORDER — QUETIAPINE FUMARATE 50 MG PO TABS
50.0000 mg | ORAL_TABLET | ORAL | Status: AC
  Administered 2022-12-24: 50 mg via ORAL
  Filled 2022-12-24: qty 1

## 2022-12-24 MED ORDER — QUETIAPINE FUMARATE 50 MG PO TABS: 50.0000 mg | ORAL_TABLET | Freq: Every day | ORAL | Status: DC

## 2022-12-24 NOTE — Discharge Instructions (Signed)
You were seen in the ER today for a refill on your Seroquel. This has been sent to your pharmacy and a dose of Seroquel was given to you here. If symptoms worsen, please return to the ER.

## 2022-12-24 NOTE — ED Triage Notes (Signed)
PT reports needing a refill on his Seroquel. Pt also is requesting a dose now.

## 2022-12-24 NOTE — ED Provider Notes (Signed)
[place] EMERGENCY DEPARTMENT AT [facility] Provider Note   CSN: [id number] Arrival date & time: 12/24/22  1237     History Chief Complaint  Patient presents with   Medication Refill    Shane Small is a 37 y.o. male.  Patient was admitted for requesting medication refill.  Reports that he has not been with a physical recently as he ran out of his prescription.  Requesting a prescription refill on Seroquel 50mg  at bedtime. Denies any suicidal ideation, homicidal ideation, auditory or visual hallucinations, or acute psychosis.   Medication Refill      Home Medications Prior to Admission medications   Medication Sig Start Date End Date Taking? Authorizing Provider  QUEtiapine (SEROQUEL) 50 MG tablet Take 1 tablet (50 mg total) by mouth at bedtime. 12/24/22 01/23/23 Yes Smitty Knudsen, PA-C  albuterol (VENTOLIN HFA) 108 (90 Base) MCG/ACT inhaler Inhale 2 puffs into the lungs every 4 (four) hours as needed for wheezing or shortness of breath. 12/10/22 01/09/23  Lauree Chandler, NP  cephALEXin [facility]) 500 MG capsule 2 caps po bid x 7 days 12/22/22   Melene Plan, DO  hydrOXYzine (ATARAX) 50 MG tablet Take 1 tablet (50 mg total) by mouth daily as needed for anxiety. 12/10/22   Lauree Chandler, NP  mirtazapine (REMERON) 15 MG tablet Take 1 tablet (15 mg total) by mouth at bedtime. 12/10/22 01/09/23  Lauree Chandler, NP  Multiple Vitamin (MULTIVITAMIN WITH MINERALS) TABS tablet Take 1 tablet by mouth daily.    [provider]  PARoxetine (PAXIL) 20 MG tablet Take 1 tablet (20 mg total) by mouth daily. 12/10/22 01/09/23  Lauree Chandler, NP  QUEtiapine (SEROQUEL) 50 MG tablet Take 1 tablet (50 mg total) by mouth at bedtime. 12/10/22 01/09/23  Lauree Chandler, NP  atomoxetine (STRATTERA) 10 MG capsule Take 10 mg by mouth daily.  10/31/19  [provider]  omeprazole (PRILOSEC) 20 MG capsule Take 1 capsule (20 mg total) by mouth 2 (two) times daily  before a meal. 09/19/19 10/31/19  Darr, Gerilyn Pilgrim, PA-C      Allergies    Patient has no known allergies.    Review of Systems   Review of Systems  Psychiatric/Behavioral:         Psychiatric medication refill  All other systems reviewed and are negative.   Physical Exam Updated Vital Signs BP (!) 182/92 (BP Location: Left Arm)   Pulse 98   Temp 98.3 F (36.8 C) (Oral)   Resp 16   SpO2 100%  Physical Exam Vitals and nursing note reviewed.  Constitutional:      General: He is not in acute distress.    Appearance: He is well-developed.  HENT:     Head: Normocephalic and atraumatic.  Eyes:     Conjunctiva/sclera: Conjunctivae normal.  Cardiovascular:     Rate and Rhythm: Normal rate and regular rhythm.     Heart sounds: No murmur heard. Pulmonary:     Effort: Pulmonary effort is normal. No respiratory distress.     Breath sounds: Normal breath sounds.  Abdominal:     Palpations: Abdomen is soft.     Tenderness: There is no abdominal tenderness.  Musculoskeletal:        General: No swelling.     Cervical back: Neck supple.  Skin:    General: Skin is warm and dry.     Capillary Refill: Capillary refill takes less than 2 seconds.  Neurological:  General: No focal deficit present.     Mental Status: He is alert. Mental status is at baseline.  Psychiatric:        Attention and Perception: Attention and perception normal. He is attentive. He does not perceive auditory or visual hallucinations.        Mood and Affect: Mood normal.        Speech: Speech normal.        Behavior: Behavior normal.        Thought Content: Thought content normal. Thought content is not paranoid or delusional.     ED Results / Procedures / Treatments   Labs (all labs ordered are listed, but only abnormal results are displayed) Labs Reviewed - No data to display  EKG None  Radiology No results found.  Procedures Procedures   Medications Ordered in ED Medications  QUEtiapine  (SEROQUEL) tablet 50 mg (has no administration in time range)    ED Course/ Medical Decision Making/ A&P                               Medical Decision Making  This patient presents to the ED for concern of medication refill.  Differential diagnosis includes psychosis, schizophrenia, depression, anxiety   Medicines ordered and prescription drug management:  I ordered medication including seroquel for schizophrenia Reevaluation of the patient after these medicines showed that the patient stayed the same I have reviewed the patients home medicines and have made adjustments as needed   Problem List / ED Course:  Patient presents emergency department concerns of needing a medication refill.  Reports he currently takes Seroquel 50 mg as directed since discharge from the behavioral health hospital.  States that he is run out of medication has not been able to get it refilled.  He has not had a dose of Seroquel in the last day or so.  Denies any suicidal ideation, homicidal ideation, paranoid thoughts, auditory visual hallucinations.  On chart review, does appear the patient was recently hospitalized.  Behavioral health and discharged home with a prescription for Seroquel 50 mg.  Will administer dose of Seroquel 50 mg in the emergency department.  Remaining prescription sent to patient's pharmacy.  Advised patient to return to primary care or psychiatry for continued management of these medications at home.  Strict return precautions discussed.  All questions answered prior to patient discharge.  Final Clinical Impression(s) / ED Diagnoses Final diagnoses:  Medication refill    Rx / DC Orders ED Discharge Orders          Ordered    QUEtiapine (SEROQUEL) 50 MG tablet  Daily at bedtime        12/24/22 1322              Smitty Knudsen, PA-C 12/24/22 1326    Bethann Berkshire, MD 12/26/22 [phone]

## 2022-12-25 ENCOUNTER — Ambulatory Visit: Payer: MEDICAID | Admitting: Critical Care Medicine

## 2023-01-07 ENCOUNTER — Encounter (HOSPITAL_COMMUNITY): Payer: Self-pay

## 2023-01-07 ENCOUNTER — Other Ambulatory Visit: Payer: Self-pay

## 2023-01-07 ENCOUNTER — Emergency Department (HOSPITAL_COMMUNITY)
Admission: EM | Admit: 2023-01-07 | Discharge: 2023-01-07 | Disposition: A | Discharge status: Home / Self Care | Payer: MEDICAID | Attending: Emergency Medicine | Admitting: Emergency Medicine

## 2023-01-07 DIAGNOSIS — X503XXA Overexertion from repetitive movements, initial encounter: Secondary | ICD-10-CM | POA: Insufficient documentation

## 2023-01-07 DIAGNOSIS — T148XXA Other injury of unspecified body region, initial encounter: Secondary | ICD-10-CM

## 2023-01-07 DIAGNOSIS — Y9301 Activity, walking, marching and hiking: Secondary | ICD-10-CM | POA: Insufficient documentation

## 2023-01-07 DIAGNOSIS — Z5900 Homelessness unspecified: Secondary | ICD-10-CM | POA: Diagnosis not present

## 2023-01-07 DIAGNOSIS — S90822A Blister (nonthermal), left foot, initial encounter: Secondary | ICD-10-CM | POA: Insufficient documentation

## 2023-01-07 MED ORDER — BACITRACIN ZINC 500 UNIT/GM EX OINT
TOPICAL_OINTMENT | Freq: Two times a day (BID) | CUTANEOUS | Status: DC
  Administered 2023-01-07: 1 via TOPICAL
  Filled 2023-01-07 (×2): qty 0.9

## 2023-01-07 MED ORDER — ACETAMINOPHEN 325 MG PO TABS
650.0000 mg | ORAL_TABLET | Freq: Once | ORAL | Status: AC
  Administered 2023-01-07: 650 mg via ORAL
  Filled 2023-01-07: qty 2

## 2023-01-07 NOTE — ED Triage Notes (Signed)
Patient has had a blister on his heel for 1 week. Stated it wont go away.

## 2023-01-07 NOTE — ED Provider Notes (Signed)
[place] EMERGENCY DEPARTMENT AT [facility] Provider Note   CSN: [id number] Arrival date & time: 01/07/23  1834     History  Chief Complaint  Patient presents with   Blister   HPI REGORY CLUNEY is a 37 y.o. male with schizophrenia, homelessness presenting for a blister on the back of his left heel.  Noticed it a week ago.  States it is somewhat tender and will not go away.  Patient states he does a lot of walking.  Denies any pustulant discharge from the blister.  Still able to ambulate and bear weight.  Denies fever or chills.  HPI     Home Medications Prior to Admission medications   Medication Sig Start Date End Date Taking? Authorizing Provider  albuterol (VENTOLIN HFA) 108 (90 Base) MCG/ACT inhaler Inhale 2 puffs into the lungs every 4 (four) hours as needed for wheezing or shortness of breath. 12/10/22 01/09/23  Lauree Chandler, NP  cephALEXin [facility]) 500 MG capsule 2 caps po bid x 7 days 12/22/22   Melene Plan, DO  hydrOXYzine (ATARAX) 50 MG tablet Take 1 tablet (50 mg total) by mouth daily as needed for anxiety. 12/10/22   Lauree Chandler, NP  mirtazapine (REMERON) 15 MG tablet Take 1 tablet (15 mg total) by mouth at bedtime. 12/10/22 01/09/23  Lauree Chandler, NP  Multiple Vitamin (MULTIVITAMIN WITH MINERALS) TABS tablet Take 1 tablet by mouth daily.    [provider]  PARoxetine (PAXIL) 20 MG tablet Take 1 tablet (20 mg total) by mouth daily. 12/10/22 01/09/23  Lauree Chandler, NP  QUEtiapine (SEROQUEL) 50 MG tablet Take 1 tablet (50 mg total) by mouth at bedtime. 12/10/22 01/09/23  Lauree Chandler, NP  QUEtiapine (SEROQUEL) 50 MG tablet Take 1 tablet (50 mg total) by mouth at bedtime. 12/24/22 01/23/23  Smitty Knudsen, PA-C  atomoxetine (STRATTERA) 10 MG capsule Take 10 mg by mouth daily.  10/31/19  [provider]  omeprazole (PRILOSEC) 20 MG capsule Take 1 capsule (20 mg total) by mouth 2 (two) times daily before a meal. 09/19/19  10/31/19  Darr, Gerilyn Pilgrim, PA-C      Allergies    Patient has no known allergies.    Review of Systems   See HPI for pertinent positives  Physical Exam Updated Vital Signs BP (!) 139/90   Pulse (!) 104   Temp 98.1 F (36.7 C) (Oral)   Resp 17   Ht 5\' 11"  (1.803 m)   Wt 72.6 kg   SpO2 100%   BMI 22.32 kg/m  Physical Exam Constitutional:      Appearance: Normal appearance.  HENT:     Head: Normocephalic.     Nose: Nose normal.  Eyes:     Conjunctiva/sclera: Conjunctivae normal.  Pulmonary:     Effort: Pulmonary effort is normal.  Feet:     Comments: Open blisterlike lesion noted to posterior aspect of the left heel.  Mildly tender to touch.  Not oozing.  Not hot to touch.  No erythema or erythema noted.` Neurological:     Mental Status: He is alert.  Psychiatric:        Mood and Affect: Mood normal.     ED Results / Procedures / Treatments   Labs (all labs ordered are listed, but only abnormal results are displayed) Labs Reviewed - No data to display  EKG None  Radiology No results found.  Procedures Procedures    Medications Ordered in ED Medications  bacitracin ointment (has no administration in time range)    ED Course/ Medical Decision Making/ A&P                                 Medical Decision Making  37 year old well-appearing male presenting for left heel blister.  Exam notable for blister on left heel.  Does not appear infected at this time.  Advised conservative treatment at home.  Treated here by cleaning with soap and water, apply bacitracin and dressing.  Also provided food and soda.  Vital stable.  Discharged home in good condition.        Final Clinical Impression(s) / ED Diagnoses Final diagnoses:  Blister    Rx / DC Orders ED Discharge Orders     None         Gareth Eagle, PA-C 01/07/23 1859    Pricilla Loveless, MD 01/08/23 1521

## 2023-01-07 NOTE — Discharge Instructions (Signed)
Evaluation today revealed that you do have a blisterlike lesion on the back your left heel.  It does not appear infected this time.  Recommend that you keep it clean daily with soap and water.  You can also apply bacitracin which is topical antibiotic ointment.  You can purchase this at your local pharmacy.  Also recommend that you expose the wound to air intermittently to dry out the blister.  Recommend you follow with your PCP.

## 2023-02-11 ENCOUNTER — Encounter (HOSPITAL_COMMUNITY): Payer: Self-pay

## 2023-02-11 ENCOUNTER — Emergency Department (HOSPITAL_COMMUNITY)
Admission: EM | Admit: 2023-02-11 | Discharge: 2023-02-11 | Disposition: A | Discharge status: Home / Self Care | Payer: MEDICAID | Attending: Physician Assistant | Admitting: Physician Assistant

## 2023-02-11 ENCOUNTER — Other Ambulatory Visit: Payer: Self-pay

## 2023-02-11 DIAGNOSIS — J45909 Unspecified asthma, uncomplicated: Secondary | ICD-10-CM | POA: Insufficient documentation

## 2023-02-11 DIAGNOSIS — Z760 Encounter for issue of repeat prescription: Secondary | ICD-10-CM | POA: Diagnosis present

## 2023-02-11 MED ORDER — ALBUTEROL SULFATE HFA 108 (90 BASE) MCG/ACT IN AERS: 2.0000 | INHALATION_SPRAY | RESPIRATORY_TRACT | 0 refills | Status: DC | PRN

## 2023-02-11 MED ORDER — ALBUTEROL SULFATE HFA 108 (90 BASE) MCG/ACT IN AERS
1.0000 | INHALATION_SPRAY | Freq: Once | RESPIRATORY_TRACT | Status: AC
  Administered 2023-02-11: 1 via RESPIRATORY_TRACT
  Filled 2023-02-11: qty 6.7

## 2023-02-11 NOTE — Discharge Instructions (Signed)
Use inhaler as needed  Return for new or worsening symptoms

## 2023-02-11 NOTE — ED Triage Notes (Signed)
Pt requests refill of albuterol inhaler. No sx or issues on arrival.

## 2023-02-11 NOTE — ED Provider Notes (Signed)
[place] EMERGENCY DEPARTMENT AT [facility] Provider Note   CSN: [id number] Arrival date & time: 02/11/23  1459    History  Chief Complaint  Patient presents with   Medication Refill    Shane Small is a 37 y.o. male history of schizophrenia, polysubstance use, asthma here for evaluation of medication refill.  He states he is low on his albuterol inhaler.  He does not have a PCP to prescribe this.  States he typically comes to the emergency department.  He denies fever, congestion, rhinorrhea, cough, shortness of breath, chest pain, back make abdominal pain, pain or swelling to lower legs. No sick contacts.  HPI     Home Medications Prior to Admission medications   Medication Sig Start Date End Date Taking? Authorizing Provider  albuterol (VENTOLIN HFA) 108 (90 Base) MCG/ACT inhaler Inhale 2 puffs into the lungs every 4 (four) hours as needed for wheezing or shortness of breath. 02/11/23 03/13/23  Gohan Collister A, PA-C  cephALEXin (KEFLEX) 500 MG capsule 2 caps po bid x 7 days 12/22/22   Melene Plan, DO  hydrOXYzine (ATARAX) 50 MG tablet Take 1 tablet (50 mg total) by mouth daily as needed for anxiety. 12/10/22   Lauree Chandler, NP  mirtazapine (REMERON) 15 MG tablet Take 1 tablet (15 mg total) by mouth at bedtime. 12/10/22 01/09/23  Lauree Chandler, NP  Multiple Vitamin (MULTIVITAMIN WITH MINERALS) TABS tablet Take 1 tablet by mouth daily.    [provider]  PARoxetine (PAXIL) 20 MG tablet Take 1 tablet (20 mg total) by mouth daily. 12/10/22 01/09/23  Lauree Chandler, NP  QUEtiapine (SEROQUEL) 50 MG tablet Take 1 tablet (50 mg total) by mouth at bedtime. 12/10/22 01/09/23  Lauree Chandler, NP  QUEtiapine (SEROQUEL) 50 MG tablet Take 1 tablet (50 mg total) by mouth at bedtime. 12/24/22 01/23/23  Smitty Knudsen, PA-C  atomoxetine (STRATTERA) 10 MG capsule Take 10 mg by mouth daily.  10/31/19  [provider]  omeprazole (PRILOSEC) 20 MG  capsule Take 1 capsule (20 mg total) by mouth 2 (two) times daily before a meal. 09/19/19 10/31/19  Darr, Gerilyn Pilgrim, PA-C      Allergies    Patient has no known allergies.    Review of Systems   Review of Systems  Constitutional: Negative.   HENT: Negative.    Respiratory: Negative.    Cardiovascular: Negative.   Gastrointestinal: Negative.   Genitourinary: Negative.   Musculoskeletal: Negative.   Skin: Negative.   Neurological: Negative.   All other systems reviewed and are negative.   Physical Exam Updated Vital Signs BP (!) 148/97 (BP Location: Left Arm)   Pulse (!) 103   Temp 98.9 F (37.2 C) (Oral)   Resp 16   Ht 5\' 11"  (1.803 m)   Wt 72.6 kg   SpO2 97%   BMI 22.32 kg/m  Physical Exam Vitals and nursing note reviewed.  Constitutional:      General: He is not in acute distress.    Appearance: He is well-developed. He is not ill-appearing, toxic-appearing or diaphoretic.  HENT:     Head: Normocephalic and atraumatic.     Mouth/Throat:     Mouth: Mucous membranes are moist.     Comments: Po clear Cardiovascular:     Rate and Rhythm: Normal rate and regular rhythm.     Pulses: Normal pulses.     Heart sounds: Normal heart sounds.  Pulmonary:     Effort: Pulmonary effort  is normal. No respiratory distress.     Breath sounds: Normal breath sounds.     Comments: Clear Bilaterally, speaks in full sentences without difficulty Abdominal:     General: There is no distension.  Musculoskeletal:        General: No swelling or tenderness. Normal range of motion.     Cervical back: Normal range of motion and neck supple.     Right lower leg: No edema.     Left lower leg: No edema.  Skin:    General: Skin is warm and dry.     Capillary Refill: Capillary refill takes less than 2 seconds.  Neurological:     General: No focal deficit present.     Mental Status: He is alert and oriented to person, place, and time.  Psychiatric:     Comments: Cooperative, follows commands      ED Results / Procedures / Treatments   Labs (all labs ordered are listed, but only abnormal results are displayed) Labs Reviewed - No data to display  EKG None  Radiology No results found.  Procedures Procedures    Medications Ordered in ED Medications  albuterol (VENTOLIN HFA) 108 (90 Base) MCG/ACT inhaler 1 puff (1 puff Inhalation Given 02/11/23 1527)    ED Course/ Medical Decision Making/ A&P   37 year old here for evaluation medication refill.  Requesting refill of his albuterol inhaler.  History of asthma.  States he is running low and does not a PCP to prescribe this.  Currently asymptomatic.  He is afebrile, nonseptic, not ill-appearing.  He denies congestion, rhinorrhea, cough, chest pain, shortness of breath, back pain, pain or swelling to lower legs.  At this time he is asymptomatic.  Will refill his inhaler.  Not feel any labs or imaging given he is currently asymptomatic.  Will have him follow-up outpatient, return for any worsening symptoms.  We discussed establishing care with a PCP.  The patient has been appropriately medically screened and/or stabilized in the ED. I have low suspicion for any other emergent medical condition which would require further screening, evaluation or treatment in the ED or require inpatient management.  Patient is hemodynamically stable and in no acute distress.  Patient able to ambulate in department prior to ED.  Evaluation does not show acute pathology that would require ongoing or additional emergent interventions while in the emergency department or further inpatient treatment.  I have discussed the diagnosis with the patient and answered all questions.  Pain is been managed while in the emergency department and patient has no further complaints prior to discharge.  Patient is comfortable with plan discussed in room and is stable for discharge at this time.  I have discussed strict return precautions for returning to the emergency  department.  Patient was encouraged to follow-up with PCP/specialist refer to at discharge.                                 Medical Decision Making Amount and/or Complexity of Data Reviewed External Data Reviewed: labs, radiology and notes.  Risk OTC drugs. Prescription drug management. Decision regarding hospitalization. Diagnosis or treatment significantly limited by social determinants of health.          Final Clinical Impression(s) / ED Diagnoses Final diagnoses:  Medication refill    Rx / DC Orders ED Discharge Orders          Ordered    albuterol (VENTOLIN HFA)  108 (90 Base) MCG/ACT inhaler  Every 4 hours PRN        02/11/23 1518              Camera Krienke A, PA-C 02/11/23 1545    Glyn Ade, MD 02/14/23 1518

## 2023-02-18 ENCOUNTER — Emergency Department (HOSPITAL_COMMUNITY)
Admission: EM | Admit: 2023-02-18 | Discharge: 2023-02-18 | Discharge status: Against Medical Advice | Payer: MEDICAID | Attending: Emergency Medicine | Admitting: Emergency Medicine

## 2023-02-18 ENCOUNTER — Other Ambulatory Visit: Payer: Self-pay

## 2023-02-18 ENCOUNTER — Encounter (HOSPITAL_COMMUNITY): Payer: Self-pay

## 2023-02-18 DIAGNOSIS — Z5321 Procedure and treatment not carried out due to patient leaving prior to being seen by health care provider: Secondary | ICD-10-CM | POA: Insufficient documentation

## 2023-02-18 DIAGNOSIS — J069 Acute upper respiratory infection, unspecified: Secondary | ICD-10-CM | POA: Insufficient documentation

## 2023-02-18 DIAGNOSIS — Z20822 Contact with and (suspected) exposure to covid-19: Secondary | ICD-10-CM | POA: Diagnosis not present

## 2023-02-18 DIAGNOSIS — R0981 Nasal congestion: Secondary | ICD-10-CM | POA: Diagnosis present

## 2023-02-18 LAB — RESP PANEL BY RT-PCR (RSV, FLU A&B, COVID)  RVPGX2
Influenza A by PCR: NEGATIVE
Influenza B by PCR: NEGATIVE
Resp Syncytial Virus by PCR: NEGATIVE
SARS Coronavirus 2 by RT PCR: NEGATIVE

## 2023-02-18 NOTE — ED Provider Triage Note (Cosign Needed Addendum)
Emergency Medicine Provider Triage Evaluation Note  EBIN MENNINGER , a 37 y.o. male  was evaluated in triage.  Pt complains of concern for pneumonia.  Complains of chills that occurred 2 nights ago.  He also complains of nasal congestion that started 1 hour ago   Review of Systems  Positive: Nasal congestion, chills Negative: Cough, known fevers, chest pain, SOB, increased inhaler use  Physical Exam  BP (!) 157/95 (BP Location: Left Arm)   Pulse 94   Temp 98.4 F (36.9 C) (Oral)   Resp 16   SpO2 96%  Gen:   Awake, no distress. Not ill appearing   Resp:  Normal effort. Lung sounds CTAB MSK:   Moves extremities without difficulty  Other:    Medical Decision Making  Medically screening exam initiated at 12:45 PM.  Appropriate orders placed.  [name] was informed that the remainder of the evaluation will be completed by another provider, this initial triage assessment does not replace that evaluation, and the importance of remaining in the ED until their evaluation is complete.  Resp panel pending   Judithann Sheen, PA 02/18/23 1246  Prior to providing patient return to emergency department precautions and information regarding shelter placement.  Patient eloped from emergency department.  He was not discharged nor given discharge paperwork.  Resp panel negative.   Judithann Sheen, PA 02/18/23 1520

## 2023-02-18 NOTE — ED Triage Notes (Signed)
Pt reports  Fever Feels like it, did not take temp Wants to rule out Pneumonia

## 2023-03-18 ENCOUNTER — Ambulatory Visit: Payer: MEDICAID | Admitting: Family Medicine

## 2023-03-28 ENCOUNTER — Emergency Department (HOSPITAL_COMMUNITY)
Admission: EM | Admit: 2023-03-28 | Discharge: 2023-03-28 | Disposition: A | Discharge status: Home / Self Care | Payer: MEDICAID | Attending: Emergency Medicine | Admitting: Emergency Medicine

## 2023-03-28 ENCOUNTER — Emergency Department (HOSPITAL_COMMUNITY): Payer: MEDICAID

## 2023-03-28 ENCOUNTER — Encounter (HOSPITAL_COMMUNITY): Payer: Self-pay

## 2023-03-28 ENCOUNTER — Other Ambulatory Visit: Payer: Self-pay

## 2023-03-28 DIAGNOSIS — R059 Cough, unspecified: Secondary | ICD-10-CM | POA: Diagnosis not present

## 2023-03-28 DIAGNOSIS — Z20822 Contact with and (suspected) exposure to covid-19: Secondary | ICD-10-CM | POA: Diagnosis not present

## 2023-03-28 DIAGNOSIS — R6883 Chills (without fever): Secondary | ICD-10-CM | POA: Insufficient documentation

## 2023-03-28 LAB — CBC WITH DIFFERENTIAL/PLATELET
Abs Immature Granulocytes: 0.01 10*3/uL (ref 0.00–0.07)
Basophils Absolute: 0 10*3/uL (ref 0.0–0.1)
Basophils Relative: 1 %
Eosinophils Absolute: 0.2 10*3/uL (ref 0.0–0.5)
Eosinophils Relative: 6 %
HCT: 42.5 % (ref 39.0–52.0)
Hemoglobin: 14.3 g/dL (ref 13.0–17.0)
Immature Granulocytes: 0 %
Lymphocytes Relative: 43 %
Lymphs Abs: 1.7 10*3/uL (ref 0.7–4.0)
MCH: 34 pg (ref 26.0–34.0)
MCHC: 33.6 g/dL (ref 30.0–36.0)
MCV: 101.2 fL — ABNORMAL HIGH (ref 80.0–100.0)
Monocytes Absolute: 0.4 10*3/uL (ref 0.1–1.0)
Monocytes Relative: 10 %
Neutro Abs: 1.5 10*3/uL — ABNORMAL LOW (ref 1.7–7.7)
Neutrophils Relative %: 40 %
Platelets: 249 10*3/uL (ref 150–400)
RBC: 4.2 MIL/uL — ABNORMAL LOW (ref 4.22–5.81)
RDW: 12.7 % (ref 11.5–15.5)
WBC: 3.8 10*3/uL — ABNORMAL LOW (ref 4.0–10.5)
nRBC: 0 % (ref 0.0–0.2)

## 2023-03-28 LAB — URINALYSIS, ROUTINE W REFLEX MICROSCOPIC
Bacteria, UA: NONE SEEN
Bilirubin Urine: NEGATIVE
Glucose, UA: NEGATIVE mg/dL
Ketones, ur: NEGATIVE mg/dL
Leukocytes,Ua: NEGATIVE
Nitrite: NEGATIVE
Protein, ur: NEGATIVE mg/dL
Specific Gravity, Urine: 1.027 (ref 1.005–1.030)
pH: 6 (ref 5.0–8.0)

## 2023-03-28 LAB — COMPREHENSIVE METABOLIC PANEL
ALT: 28 U/L (ref 0–44)
AST: 39 U/L (ref 15–41)
Albumin: 4.2 g/dL (ref 3.5–5.0)
Alkaline Phosphatase: 59 U/L (ref 38–126)
Anion gap: 8 (ref 5–15)
BUN: 20 mg/dL (ref 6–20)
CO2: 23 mmol/L (ref 22–32)
Calcium: 8.5 mg/dL — ABNORMAL LOW (ref 8.9–10.3)
Chloride: 103 mmol/L (ref 98–111)
Creatinine, Ser: 0.81 mg/dL (ref 0.61–1.24)
GFR, Estimated: >60 mL/min (ref >60)
Glucose, Bld: 86 mg/dL (ref 70–99)
Potassium: 3.8 mmol/L (ref 3.5–5.1)
Sodium: 134 mmol/L — ABNORMAL LOW (ref 135–145)
Total Bilirubin: 0.8 mg/dL (ref <1.2)
Total Protein: 7.5 g/dL (ref 6.5–8.1)

## 2023-03-28 LAB — RESP PANEL BY RT-PCR (RSV, FLU A&B, COVID)  RVPGX2
Influenza A by PCR: NEGATIVE
Influenza B by PCR: NEGATIVE
Resp Syncytial Virus by PCR: NEGATIVE
SARS Coronavirus 2 by RT PCR: NEGATIVE

## 2023-03-28 LAB — RAPID URINE DRUG SCREEN, HOSP PERFORMED
Amphetamines: NOT DETECTED
Barbiturates: NOT DETECTED
Benzodiazepines: NOT DETECTED
Cocaine: NOT DETECTED
Opiates: NOT DETECTED
Tetrahydrocannabinol: NOT DETECTED

## 2023-03-28 LAB — ETHANOL: Alcohol, Ethyl (B): <10 mg/dL (ref <10)

## 2023-03-28 LAB — TROPONIN I (HIGH SENSITIVITY): Troponin I (High Sensitivity): 4 ng/L (ref <18)

## 2023-03-28 NOTE — ED Triage Notes (Signed)
Patient reports cold chills and "feeling like I'm about to have a seizure." No hx of seizures. Just started on new psych medications and feels more drowsy than normal.

## 2023-03-28 NOTE — ED Provider Notes (Signed)
[place] EMERGENCY DEPARTMENT AT [facility] Provider Note   CSN: [id number] Arrival date & time: 03/28/23  1140     History  Chief Complaint  Patient presents with   Tremors    Shane Small is a 37 y.o. male, history of schizophrenia, who presents to the ED secondary to a cough, chills, and shaking episodes, though been going on for the last few days.  He states he cannot stop shaking, and feels like he is about to have a seizure.  He states that he has never had a seizure, and drinks about 2 many bottles of liquor a day, and has not stopped drinking.  He notes that he has never had a seizure before, but feels like he is about to have 1.  Reports he has been compliant with his psych meds but that they make him feel little bit more sleepy.  Denies any fevers, abdominal pain, urinary symptoms.    Home Medications Prior to Admission medications   Medication Sig Start Date End Date Taking? Authorizing Provider  albuterol (VENTOLIN HFA) 108 (90 Base) MCG/ACT inhaler Inhale 2 puffs into the lungs every 4 (four) hours as needed for wheezing or shortness of breath. 02/11/23 03/13/23  Henderly, Britni A, PA-C  cephALEXin (KEFLEX) 500 MG capsule 2 caps po bid x 7 days 12/22/22   Melene Plan, DO  hydrOXYzine (ATARAX) 50 MG tablet Take 1 tablet (50 mg total) by mouth daily as needed for anxiety. 12/10/22   Lauree Chandler, NP  mirtazapine (REMERON) 15 MG tablet Take 1 tablet (15 mg total) by mouth at bedtime. 12/10/22 01/09/23  Lauree Chandler, NP  Multiple Vitamin (MULTIVITAMIN WITH MINERALS) TABS tablet Take 1 tablet by mouth daily.    [provider]  PARoxetine (PAXIL) 20 MG tablet Take 1 tablet (20 mg total) by mouth daily. 12/10/22 01/09/23  Lauree Chandler, NP  QUEtiapine (SEROQUEL) 50 MG tablet Take 1 tablet (50 mg total) by mouth at bedtime. 12/10/22 01/09/23  Lauree Chandler, NP  QUEtiapine (SEROQUEL) 50 MG tablet Take 1 tablet (50 mg total) by mouth at  bedtime. 12/24/22 01/23/23  Smitty Knudsen, PA-C  atomoxetine (STRATTERA) 10 MG capsule Take 10 mg by mouth daily.  10/31/19  [provider]  omeprazole (PRILOSEC) 20 MG capsule Take 1 capsule (20 mg total) by mouth 2 (two) times daily before a meal. 09/19/19 10/31/19  Darr, Gerilyn Pilgrim, PA-C      Allergies    Patient has no known allergies.    Review of Systems   Review of Systems  Constitutional:  Positive for chills. Negative for fever.    Physical Exam Updated Vital Signs BP (!) 141/87 (BP Location: Left Arm)   Pulse 80   Temp (!) 97.4 F (36.3 C) (Oral)   Resp 16   Ht 5\' 11"  (1.803 m)   Wt 77.1 kg   SpO2 98%   BMI 23.71 kg/m  Physical Exam Vitals and nursing note reviewed.  Constitutional:      General: He is not in acute distress.    Appearance: He is well-developed.  HENT:     Head: Normocephalic and atraumatic.  Eyes:     Conjunctiva/sclera: Conjunctivae normal.  Cardiovascular:     Rate and Rhythm: Normal rate and regular rhythm.     Heart sounds: No murmur heard. Pulmonary:     Effort: Pulmonary effort is normal. No respiratory distress.     Breath sounds: Normal breath sounds.  Abdominal:  Palpations: Abdomen is soft.     Tenderness: There is no abdominal tenderness.  Musculoskeletal:        General: No swelling.     Cervical back: Neck supple.  Skin:    General: Skin is warm and dry.     Capillary Refill: Capillary refill takes less than 2 seconds.  Neurological:     Mental Status: He is alert.  Psychiatric:        Mood and Affect: Mood normal.     ED Results / Procedures / Treatments   Labs (all labs ordered are listed, but only abnormal results are displayed) Labs Reviewed  CBC WITH DIFFERENTIAL/PLATELET - Abnormal; Notable for the following components:      Result Value   WBC 3.8 (*)    RBC 4.20 (*)    MCV 101.2 (*)    Neutro Abs 1.5 (*)    All other components within normal limits  COMPREHENSIVE METABOLIC PANEL - Abnormal; Notable  for the following components:   Sodium 134 (*)    Calcium 8.5 (*)    All other components within normal limits  RESP PANEL BY RT-PCR (RSV, FLU A&B, COVID)  [id number]  RAPID URINE DRUG SCREEN, HOSP PERFORMED  ETHANOL  URINALYSIS, ROUTINE W REFLEX MICROSCOPIC  TROPONIN I (HIGH SENSITIVITY)    EKG EKG Interpretation Date/Time:  Thursday March 28 2023 12:33:15 EST Ventricular Rate:  76 PR Interval:  167 QRS Duration:  96 QT Interval:  366 QTC Calculation: 412 R Axis:   89  Text Interpretation: Sinus rhythm Probable left atrial enlargement Anterolateral ST elevation, probable early repolarization Similar to previous EKGs Confirmed by Beckey Downing [phone]) on 03/28/2023 12:41:38 PM  Radiology No results found.  Procedures Procedures    Medications Ordered in ED Medications - No data to display  ED Course/ Medical Decision Making/ A&P                                 Medical Decision Making Patient is a 37 year old male here for feeling chills, been going on for the last few days.  He states that he also feels like he is going to have a seizure, but has never had a seizure.  Has not stopped drinking, continues to smoke.  Also reports a cough and feeling some chills.  He is afebrile, well-appearing, lungs are clear, I ordered a chest x-ray, however the patient declined, stating I do not need that.  His lungs are clear, and but given his feeling of chills, and cough, I thought that would be recommended, he voiced understanding the risk of not getting the chest x-ray.  We obtain blood work, urinalysis, for further evaluation.  I believe that his cough may be secondary to his tobacco use.  Amount and/or Complexity of Data Reviewed Labs: ordered.    Details: Unremarkable, no electrolyte abnormalities, no use of drugs Radiology: ordered. Discussion of management or test interpretation with external provider(s): Blood work is overall reassuring, patient declined any kind of x-ray, he feels  better after eating some food, and no longer feels like he is going to have a seizure.  He has continued to drink alcohol, and has not stopped for a long period of time thus I think this is unlikely to be withdrawal.  Will have him discharge home, with strict return precautions.    Final Clinical Impression(s) / ED Diagnoses Final diagnoses:  Chills    Rx /  DC Orders ED Discharge Orders     None         Pete Pelt, [place] 03/28/23 1423    Gwyneth Sprout, MD 03/29/23 1016

## 2023-03-28 NOTE — Discharge Instructions (Signed)
Your labs are reassuring, you declined an x-ray, however your lungs are well sounding.  Please return to the ER if you feel like your symptoms are worsening.  This may be side effect of your new medication.  Please follow-up with PCP, I provided you with information for clinic, to go to, or you can follow-up with the PCP of your choice using the number below

## 2023-04-05 ENCOUNTER — Other Ambulatory Visit: Payer: Self-pay

## 2023-04-05 ENCOUNTER — Emergency Department (HOSPITAL_COMMUNITY)
Admission: EM | Admit: 2023-04-05 | Discharge: 2023-04-05 | Disposition: A | Discharge status: Home / Self Care | Payer: MEDICAID | Attending: Emergency Medicine | Admitting: Emergency Medicine

## 2023-04-05 DIAGNOSIS — Z760 Encounter for issue of repeat prescription: Secondary | ICD-10-CM | POA: Insufficient documentation

## 2023-04-05 DIAGNOSIS — J9801 Acute bronchospasm: Secondary | ICD-10-CM | POA: Insufficient documentation

## 2023-04-05 MED ORDER — IPRATROPIUM-ALBUTEROL 0.5-2.5 (3) MG/3ML IN SOLN
3.0000 mL | Freq: Once | RESPIRATORY_TRACT | Status: AC
  Administered 2023-04-05: 3 mL via RESPIRATORY_TRACT
  Filled 2023-04-05: qty 3

## 2023-04-05 MED ORDER — ALBUTEROL SULFATE HFA 108 (90 BASE) MCG/ACT IN AERS
2.0000 | INHALATION_SPRAY | RESPIRATORY_TRACT | Status: DC | PRN
  Administered 2023-04-05: 2 via RESPIRATORY_TRACT
  Filled 2023-04-05: qty 6.7

## 2023-04-05 NOTE — ED Triage Notes (Signed)
Pt requesting albuterol inhaler and reports he lost his. Pt denies any complaints at present. Pt resp equal and unlabored.

## 2023-04-05 NOTE — ED Provider Notes (Signed)
  [facility]-EMERGENCY DEPT [facility] Emergency Department Provider Note MRN:  [id number]  Arrival date & time: 04/05/23     Chief Complaint   Medication Refill   History of Present Illness   Shane Small is a 37 y.o. year-old male presents to the ED with chief complaint of needing a new inhaler.  Hx of asthma.  States that he doesn't have his inhaler.  He states that his breathing is ok right now.  States that he has been out in the cold tonight.  Denies any other symptoms.  History provided by patient.   Review of Systems  Pertinent positive and negative review of systems noted in HPI.    Physical Exam   Vitals:   04/05/23 0118  BP: 136/85  Pulse: 84  Resp: 18  Temp: 97.9 F (36.6 C)  SpO2: 95%    CONSTITUTIONAL:  non toxic-appearing, NAD NEURO:  Alert and oriented x 3, CN 3-12 grossly intact EYES:  eyes equal and reactive ENT/NECK:  Supple, no stridor  CARDIO:  normal rate, regular rhythm, appears well-perfused  PULM:  No respiratory distress, mild end expiratory wheezing GI/GU:  non-distended,  MSK/SPINE:  No gross deformities, no edema, moves all extremities  SKIN:  no rash, atraumatic   *Additional and/or pertinent findings included in MDM below  Diagnostic and Interventional Summary    EKG Interpretation Date/Time:    Ventricular Rate:    PR Interval:    QRS Duration:    QT Interval:    QTC Calculation:   R Axis:      Text Interpretation:         Labs Reviewed - No data to display  No orders to display    Medications  ipratropium-albuterol (DUONEB) 0.5-2.5 (3) MG/3ML nebulizer solution 3 mL (has no administration in time range)  albuterol (VENTOLIN HFA) 108 (90 Base) MCG/ACT inhaler 2 puff (has no administration in time range)     Procedures  /  Critical Care Procedures  ED Course and Medical Decision Making  I have reviewed the triage vital signs, the nursing notes, and pertinent available records from the EMR.  Social Determinants  Affecting Complexity of Care: Patient is homelessness has decreased access to medical care.   ED Course:    Medical Decision Making Patient here requesting refill of his inhaler.  He states that his breathing is ok, but I do hear some faint wheezing.  Will given a breathing treatment and discharge home with an inhaler.  Vitals are stable.   Risk Prescription drug management.         Consultants: No consultations were needed in caring for this patient.   Treatment and Plan: Emergency department workup does not suggest an emergent condition requiring admission or immediate intervention beyond  what has been performed at this time. The patient is safe for discharge and has  been instructed to return immediately for worsening symptoms, change in  symptoms or any other concerns    Final Clinical Impressions(s) / ED Diagnoses     ICD-10-CM   1. Bronchospasm  J98.01     2. Medication refill  Z76.0       ED Discharge Orders     None         Discharge Instructions Discussed with and Provided to Patient:   Discharge Instructions   None      Roxy Horseman, PA-C 04/05/23 0440    Terald Sleeper, MD 04/05/23 [phone]

## 2023-04-10 DIAGNOSIS — M25571 Pain in right ankle and joints of right foot: Secondary | ICD-10-CM | POA: Insufficient documentation

## 2023-04-10 DIAGNOSIS — M25511 Pain in right shoulder: Secondary | ICD-10-CM | POA: Diagnosis present

## 2023-04-11 ENCOUNTER — Ambulatory Visit: Payer: MEDICAID | Admitting: Family Medicine

## 2023-04-11 ENCOUNTER — Emergency Department (HOSPITAL_COMMUNITY)
Admission: EM | Admit: 2023-04-11 | Discharge: 2023-04-11 | Disposition: A | Discharge status: Home / Self Care | Payer: MEDICAID | Attending: Emergency Medicine | Admitting: Emergency Medicine

## 2023-04-11 ENCOUNTER — Encounter (HOSPITAL_COMMUNITY): Payer: Self-pay

## 2023-04-11 ENCOUNTER — Other Ambulatory Visit: Payer: Self-pay

## 2023-04-11 DIAGNOSIS — M25571 Pain in right ankle and joints of right foot: Secondary | ICD-10-CM

## 2023-04-11 DIAGNOSIS — M25511 Pain in right shoulder: Secondary | ICD-10-CM

## 2023-04-11 MED ORDER — IBUPROFEN 200 MG PO TABS
600.0000 mg | ORAL_TABLET | Freq: Once | ORAL | Status: AC
  Administered 2023-04-11: 600 mg via ORAL
  Filled 2023-04-11: qty 3

## 2023-04-11 MED ORDER — ACETAMINOPHEN 500 MG PO TABS
1000.0000 mg | ORAL_TABLET | Freq: Once | ORAL | Status: AC
  Administered 2023-04-11: 1000 mg via ORAL
  Filled 2023-04-11: qty 2

## 2023-04-11 NOTE — ED Provider Notes (Signed)
[place] EMERGENCY DEPARTMENT AT [facility] Provider Note   CSN: [id number] Arrival date & time: 04/10/23  2356     History Chief Complaint  Patient presents with   Shoulder Pain    Shane Small is a 37 y.o. male with history of paranoid signs of hernia currently experiencing homelessness presents emerged from today for evaluation of shoulder and right ankle pain has been hurting for the past few hours.  Patient reports he has been sleeping on the ground with a sleeping bag recently but denies any trauma or recent falls.  He has some pain with movement of his shoulder but is ambulatory.  He denies any weakness, numbness, or tingling.  Denies any IV drug use ever.  Denies any fevers.  Has not take any medication for pain prior to arrival.  Requesting a place to lay down.  No known drug allergies.  Reports occasional tobacco use but denies any EtOH or illicit drug use.   Shoulder Pain Associated symptoms: no fever        Home Medications Prior to Admission medications   Medication Sig Start Date End Date Taking? Authorizing Provider  albuterol (VENTOLIN HFA) 108 (90 Base) MCG/ACT inhaler Inhale 2 puffs into the lungs every 4 (four) hours as needed for wheezing or shortness of breath. 02/11/23 03/13/23  Henderly, Britni A, PA-C  cephALEXin (KEFLEX) 500 MG capsule 2 caps po bid x 7 days 12/22/22   Melene Plan, DO  hydrOXYzine (ATARAX) 50 MG tablet Take 1 tablet (50 mg total) by mouth daily as needed for anxiety. 12/10/22   Lauree Chandler, NP  mirtazapine (REMERON) 15 MG tablet Take 1 tablet (15 mg total) by mouth at bedtime. 12/10/22 01/09/23  Lauree Chandler, NP  Multiple Vitamin (MULTIVITAMIN WITH MINERALS) TABS tablet Take 1 tablet by mouth daily.    [provider]  PARoxetine (PAXIL) 20 MG tablet Take 1 tablet (20 mg total) by mouth daily. 12/10/22 01/09/23  Lauree Chandler, NP  QUEtiapine (SEROQUEL) 50 MG tablet Take 1 tablet (50 mg total) by mouth  at bedtime. 12/10/22 01/09/23  Lauree Chandler, NP  QUEtiapine (SEROQUEL) 50 MG tablet Take 1 tablet (50 mg total) by mouth at bedtime. 12/24/22 01/23/23  Smitty Knudsen, PA-C  atomoxetine (STRATTERA) 10 MG capsule Take 10 mg by mouth daily.  10/31/19  [provider]  omeprazole (PRILOSEC) 20 MG capsule Take 1 capsule (20 mg total) by mouth 2 (two) times daily before a meal. 09/19/19 10/31/19  Darr, Gerilyn Pilgrim, PA-C      Allergies    Patient has no known allergies.    Review of Systems   Review of Systems  Constitutional:  Negative for chills and fever.  Musculoskeletal:  Positive for arthralgias. Negative for joint swelling.  Neurological:  Negative for weakness and numbness.    Physical Exam Updated Vital Signs BP 129/76 (BP Location: Left Arm)   Pulse 82   Temp 98.2 F (36.8 C) (Oral)   Resp 16   Ht 5\' 11"  (1.803 m)   Wt 77.1 kg   SpO2 96%   BMI 23.71 kg/m  Physical Exam Vitals and nursing note reviewed.  Constitutional:      General: He is not in acute distress.    Appearance: He is not ill-appearing or toxic-appearing.     Comments: Sleeping on stretcher but awakens to voice  Eyes:     General: No scleral icterus. Pulmonary:     Effort: Pulmonary effort is  normal. No respiratory distress.  Musculoskeletal:     Comments: Dry skin present but no acute abnormality.  No erythema or increase in warmth.  No fluctuance or induration.  No wounds or abrasions.  Brisk cap refill.  Palpable DP and PT pulses.  Flexion extension of ankle with and without resistance intact and without pain.  Compartments are soft.  Coloration temperature appear and feel symmetric bilaterally.  Sensation reportedly intact by patient.  No point tenderness.  For the shoulder, patient has full range of motion of the shoulder with minimal pain.  I do not appreciate any crepitus on movement.  Quadrants are soft throughout.  Palpable radial pulses.  Brisk cap refill present in all 5 fingers.  No swelling.   Compartments are soft.  Skin:    General: Skin is warm and dry.  Neurological:     Mental Status: He is alert.     ED Results / Procedures / Treatments   Labs (all labs ordered are listed, but only abnormal results are displayed) Labs Reviewed - No data to display  EKG None  Radiology No results found.  Procedures Procedures   Medications Ordered in ED Medications - No data to display  ED Course/ Medical Decision Making/ A&P   Medical Decision Making  37 y.o. male presents to the ER today for evaluation of right shoulder and ankle pain. Differential diagnosis includes but is not limited to sprain, strain, ache. Vital signs unremarkable. Physical exam as noted above.   Patient presenting here today with atraumatic right shoulder and right ankle pain.  He is currently sleeping in a sleeping bag on the concrete as he is experiencing homelessness.  He denies any trauma to the area. There is no overlying skin changes, swelling or even point tenderness.  He does not have any fever.  There is no overlying erythema or warmth.  Denies any IV drug use ever.  I doubt there is any septic arthritis.  He is likely experiencing typical aches and pains given his sleeping arrangements.  He is still ambulatory and has full range of motion.  Neurovascular intact distally.  I do not think any imaging are needed at this time.  Patient is in agreements.  Will give him a dose of Tylenol and ibuprofen here.  Recommended follow-up with his primary care provider.  Shelter resources provided.  We discussed plan at bedside. We discussed strict return precautions and red flag symptoms. The patient verbalized their understanding and agrees to the plan. The patient is stable and being discharged home in good condition.  Portions of this report may have been transcribed using voice recognition software. Every effort was made to ensure accuracy; however, inadvertent computerized transcription errors may be  present.   Final Clinical Impression(s) / ED Diagnoses Final diagnoses:  Pain in joint of right shoulder  Acute right ankle pain    Rx / DC Orders ED Discharge Orders     None         Achille Rich, PA-C 04/11/23 1610    Gilda Crease, MD 04/18/23 [phone]

## 2023-04-11 NOTE — Discharge Instructions (Signed)
Please follow up with your PCP. Please take Tylenol and or ibuprofen for pain as needed. If you have any concerns, new or worsening symptoms, please return to the nearest ER for re-evaluation.   Contact a doctor if: Your pain gets worse. Medicine does not help your pain. You have new pain in your arm, hand, or fingers. You loosen your sling and your arm, hand, or fingers: Tingle. Are numb. Are swollen. Get help right away if: Your arm, hand, or fingers turn white or blue.

## 2023-04-11 NOTE — ED Triage Notes (Signed)
Pt states that he has right shoulder and right ankle pain x "a few hours". Pt denies specific injury and states he thinks it's related to his work. Pt is a Public affairs consultant.

## 2023-04-22 ENCOUNTER — Emergency Department (HOSPITAL_COMMUNITY)
Admission: EM | Admit: 2023-04-22 | Discharge: 2023-04-22 | Disposition: A | Discharge status: Home / Self Care | Payer: MEDICAID | Attending: Emergency Medicine | Admitting: Emergency Medicine

## 2023-04-22 ENCOUNTER — Emergency Department (HOSPITAL_COMMUNITY): Payer: MEDICAID

## 2023-04-22 ENCOUNTER — Encounter (HOSPITAL_COMMUNITY): Payer: Self-pay

## 2023-04-22 ENCOUNTER — Other Ambulatory Visit: Payer: Self-pay

## 2023-04-22 DIAGNOSIS — M79671 Pain in right foot: Secondary | ICD-10-CM | POA: Insufficient documentation

## 2023-04-22 DIAGNOSIS — G8929 Other chronic pain: Secondary | ICD-10-CM | POA: Diagnosis not present

## 2023-04-22 DIAGNOSIS — M25571 Pain in right ankle and joints of right foot: Secondary | ICD-10-CM | POA: Diagnosis present

## 2023-04-22 NOTE — ED Triage Notes (Signed)
Right ankle pain a few months ago. Pt states he is able to walk on it. He is here because he wants it wrapped.

## 2023-04-22 NOTE — ED Provider Notes (Signed)
[place] EMERGENCY DEPARTMENT AT [facility] Provider Note   CSN: [id number] Arrival date & time: 04/22/23  1926     History  Chief Complaint  Patient presents with   Ankle Pain    Shane Small is a 37 y.o. male.  HPI    37 year old patient comes in with chief complaint of ankle pain.  Patient states that the ankle pain has been present for few months.  He is having difficulty with walking.  Pain is located close by his heels.  He denies any trauma.  He has not seen any doctor for his injury.  Home Medications Prior to Admission medications   Medication Sig Start Date End Date Taking? Authorizing Provider  albuterol (VENTOLIN HFA) 108 (90 Base) MCG/ACT inhaler Inhale 2 puffs into the lungs every 4 (four) hours as needed for wheezing or shortness of breath. 02/11/23 03/13/23  Henderly, Britni A, PA-C  cephALEXin (KEFLEX) 500 MG capsule 2 caps po bid x 7 days 12/22/22   Melene Plan, DO  hydrOXYzine (ATARAX) 50 MG tablet Take 1 tablet (50 mg total) by mouth daily as needed for anxiety. 12/10/22   Lauree Chandler, NP  mirtazapine (REMERON) 15 MG tablet Take 1 tablet (15 mg total) by mouth at bedtime. 12/10/22 01/09/23  Lauree Chandler, NP  Multiple Vitamin (MULTIVITAMIN WITH MINERALS) TABS tablet Take 1 tablet by mouth daily.    [provider]  PARoxetine (PAXIL) 20 MG tablet Take 1 tablet (20 mg total) by mouth daily. 12/10/22 01/09/23  Lauree Chandler, NP  QUEtiapine (SEROQUEL) 50 MG tablet Take 1 tablet (50 mg total) by mouth at bedtime. 12/10/22 01/09/23  Lauree Chandler, NP  QUEtiapine (SEROQUEL) 50 MG tablet Take 1 tablet (50 mg total) by mouth at bedtime. 12/24/22 01/23/23  Smitty Knudsen, PA-C  atomoxetine (STRATTERA) 10 MG capsule Take 10 mg by mouth daily.  10/31/19  [provider]  omeprazole (PRILOSEC) 20 MG capsule Take 1 capsule (20 mg total) by mouth 2 (two) times daily before a meal. 09/19/19 10/31/19  Darr, Gerilyn Pilgrim, PA-C       Allergies    Patient has no known allergies.    Review of Systems   Review of Systems  All other systems reviewed and are negative.   Physical Exam Updated Vital Signs BP (!) 132/90 (BP Location: Right Arm)   Pulse 86   Resp 18   SpO2 92%  Physical Exam Vitals and nursing note reviewed.  HENT:     Head: Normocephalic.     Nose: Nose normal.  Eyes:     Pupils: Pupils are equal, round, and reactive to light.  Musculoskeletal:        General: No swelling or deformity.     Comments: Patient has no tenderness with palpation over the ankle.  He does have some discomfort over the midfoot, on the plantar aspect.  Neurological:     Mental Status: He is alert.     ED Results / Procedures / Treatments   Labs (all labs ordered are listed, but only abnormal results are displayed) Labs Reviewed - No data to display  EKG None  Radiology No results found.  Procedures Procedures    Medications Ordered in ED Medications - No data to display  ED Course/ Medical Decision Making/ A&P  Medical Decision Making Amount and/or Complexity of Data Reviewed Radiology: ordered.   37 year old patient comes in with chief complaint of foot pain.  No trauma.  Pain has been present for several months, pain worse with walking.  Differential includes old fractures, nonunion, ligamentous injury.  X-ray of the foot ordered and independently interpreted.  There is no evidence of acute fracture.  We will advised patient to follow-up with podiatrist if needed.  Patient thinks that he has good shoes already.  Final Clinical Impression(s) / ED Diagnoses Final diagnoses:  Chronic foot pain, right    Rx / DC Orders ED Discharge Orders     None         Derwood Kaplan, MD 04/22/23 2113

## 2023-04-22 NOTE — Discharge Instructions (Signed)
X-ray shows no evidence of any fracture.  Please follow-up with the podiatrist if your pain continues.

## 2023-04-29 ENCOUNTER — Other Ambulatory Visit: Payer: Self-pay

## 2023-04-29 ENCOUNTER — Emergency Department (HOSPITAL_COMMUNITY): Payer: MEDICAID

## 2023-04-29 ENCOUNTER — Emergency Department (HOSPITAL_COMMUNITY)
Admission: EM | Admit: 2023-04-29 | Discharge: 2023-04-30 | Disposition: A | Discharge status: Home / Self Care | Payer: MEDICAID | Attending: Emergency Medicine | Admitting: Emergency Medicine

## 2023-04-29 ENCOUNTER — Encounter (HOSPITAL_COMMUNITY): Payer: Self-pay

## 2023-04-29 DIAGNOSIS — S91302A Unspecified open wound, left foot, initial encounter: Secondary | ICD-10-CM | POA: Diagnosis not present

## 2023-04-29 DIAGNOSIS — Z5189 Encounter for other specified aftercare: Secondary | ICD-10-CM

## 2023-04-29 DIAGNOSIS — X58XXXA Exposure to other specified factors, initial encounter: Secondary | ICD-10-CM | POA: Insufficient documentation

## 2023-04-29 DIAGNOSIS — M79672 Pain in left foot: Secondary | ICD-10-CM | POA: Diagnosis present

## 2023-04-29 NOTE — ED Triage Notes (Signed)
Pt to ED by pov from home with c/o L foot pain which began yesterday. Pt has an abrasion to the top of his foot that he states is from when he had his foot wrapped due to ankle pain. Pt was seen on the 23rd for the same. Endorses etoh consumption today, states he is not drunk. Arrives A+O, VSS, NADN.

## 2023-04-30 ENCOUNTER — Ambulatory Visit: Payer: Self-pay

## 2023-04-30 MED ORDER — BACITRACIN ZINC 500 UNIT/GM EX OINT
TOPICAL_OINTMENT | Freq: Two times a day (BID) | CUTANEOUS | Status: DC
  Administered 2023-04-30: 1 via TOPICAL
  Filled 2023-04-30: qty 0.9

## 2023-04-30 NOTE — Telephone Encounter (Signed)
  Chief Complaint: Wound care advice Symptoms: Redness Frequency: Yesterday Pertinent Negatives: Patient denies fever, spreading redness. Warmth to the area Disposition: [] ED /[] Urgent Care (no appt availability in office) / [] Appointment(In office/virtual)/ []  [facility]/ [x] Home Care/ [] Refused Recommended Disposition /[] [facility]/ []  Follow-up with PCP Additional Notes: Pt was seen in Ed yesterday for wound on top of left foot. Area was cleaned and ABX applied. Advised pt to continue to apply ABX ointment and cover. Keep area clean . Pt to call back seek care if redness spreads, area becomes warm or a fever develops.     Summary: Seeking clinical advice.   Pt stated he was seen in ED yesterday with a wound on his left heel. Pt asked if something was called in; advised pt I don't see anything that looks like doctor-notated OTC. Pt asked what he can buy to clean the area and/or use as an ointment.  Seeking clinical advice.       Reason for Disposition  Minor cut or scratch  Answer Assessment - Initial Assessment Questions 1. APPEARANCE of INJURY: What does the injury look like?      red  4. LOCATION: Where is the injury located?      Top of foot 5. ONSET: How long ago did the injury occur?      yesterday  Protocols used: Skin Injury-A-AH

## 2023-05-06 ENCOUNTER — Emergency Department (HOSPITAL_COMMUNITY)
Admission: EM | Admit: 2023-05-06 | Discharge: 2023-05-06 | Disposition: A | Discharge status: Home / Self Care | Payer: MEDICAID | Source: Home / Self Care | Attending: Emergency Medicine | Admitting: Emergency Medicine

## 2023-05-06 ENCOUNTER — Emergency Department (HOSPITAL_COMMUNITY)
Admission: EM | Admit: 2023-05-06 | Discharge: 2023-05-06 | Discharge status: Against Medical Advice | Payer: MEDICAID | Attending: Emergency Medicine | Admitting: Emergency Medicine

## 2023-05-06 ENCOUNTER — Other Ambulatory Visit: Payer: Self-pay

## 2023-05-06 DIAGNOSIS — Z8673 Personal history of transient ischemic attack (TIA), and cerebral infarction without residual deficits: Secondary | ICD-10-CM | POA: Insufficient documentation

## 2023-05-06 DIAGNOSIS — J45909 Unspecified asthma, uncomplicated: Secondary | ICD-10-CM | POA: Insufficient documentation

## 2023-05-06 DIAGNOSIS — Z4800 Encounter for change or removal of nonsurgical wound dressing: Secondary | ICD-10-CM | POA: Insufficient documentation

## 2023-05-06 DIAGNOSIS — Z7951 Long term (current) use of inhaled steroids: Secondary | ICD-10-CM | POA: Insufficient documentation

## 2023-05-06 DIAGNOSIS — M79671 Pain in right foot: Secondary | ICD-10-CM | POA: Diagnosis present

## 2023-05-06 DIAGNOSIS — L84 Corns and callosities: Secondary | ICD-10-CM | POA: Insufficient documentation

## 2023-05-06 DIAGNOSIS — M79672 Pain in left foot: Secondary | ICD-10-CM | POA: Diagnosis present

## 2023-05-06 DIAGNOSIS — Z5321 Procedure and treatment not carried out due to patient leaving prior to being seen by health care provider: Secondary | ICD-10-CM | POA: Diagnosis not present

## 2023-05-06 DIAGNOSIS — Z5189 Encounter for other specified aftercare: Secondary | ICD-10-CM

## 2023-05-06 DIAGNOSIS — M722 Plantar fascial fibromatosis: Secondary | ICD-10-CM

## 2023-05-06 MED ORDER — KETOROLAC TROMETHAMINE 15 MG/ML IJ SOLN
30.0000 mg | Freq: Once | INTRAMUSCULAR | Status: AC
  Administered 2023-05-06: 30 mg via INTRAMUSCULAR
  Filled 2023-05-06: qty 2

## 2023-05-06 MED ORDER — BACITRACIN ZINC 500 UNIT/GM EX OINT
TOPICAL_OINTMENT | Freq: Once | CUTANEOUS | Status: AC
  Filled 2023-05-06: qty 1.8

## 2023-05-06 MED ORDER — KETOROLAC TROMETHAMINE 15 MG/ML IJ SOLN
15.0000 mg | Freq: Once | INTRAMUSCULAR | Status: AC
  Administered 2023-05-06: 15 mg via INTRAMUSCULAR
  Filled 2023-05-06: qty 1

## 2023-05-06 NOTE — Discharge Instructions (Addendum)
 It was pleasure caring for you today.  Please follow-up with primary care provider.  Seek emergency care experiencing any new or worsening symptoms.  Alternating between 650 mg Tylenol  and 400 mg Advil : The best way to alternate taking Acetaminophen  (example Tylenol ) and Ibuprofen  (example Advil /Motrin ) is to take them 3 hours apart. For example, if you take ibuprofen  at 6 am you can then take Tylenol  at 9 am. You can continue this regimen throughout the day, making sure you do not exceed the recommended maximum dose for each drug.

## 2023-05-06 NOTE — Discharge Instructions (Addendum)
 Keep the foot clean and dry.  Follow up with the foot doctor if you continue to have problems.

## 2023-05-06 NOTE — ED Provider Notes (Signed)
 [place] EMERGENCY DEPARTMENT AT [facility] Provider Note   CSN: [id number] Arrival date & time: 05/06/23  1936     History  No chief complaint on file.   Shane Small is a 38 y.o. male.  Patient is a 38 year old male with a history of paranoid schizophrenia, asthma, prior stroke, depression and anxiety.  Patient presented today reporting he is having pain in both of his feet.  He has a known skin tear on the top of his left foot and reports he wants it rechecked.  Patient reports that his feet hurt him all the time and he has to walk a lot.  He does not have any other complaints at this time.  Patient was seen earlier today his foot was wrapped and wound was cleaned.  The history is provided by the patient.       Home Medications Prior to Admission medications   Medication Sig Start Date End Date Taking? Authorizing Provider  albuterol  (VENTOLIN  HFA) 108 (90 Base) MCG/ACT inhaler Inhale 2 puffs into the lungs every 4 (four) hours as needed for wheezing or shortness of breath. 02/11/23 03/13/23  Henderly, Britni A, PA-C  cephALEXin  (KEFLEX ) 500 MG capsule 2 caps po bid x 7 days 12/22/22   Emil Share, DO  hydrOXYzine  (ATARAX ) 50 MG tablet Take 1 tablet (50 mg total) by mouth daily as needed for anxiety. 12/10/22   Lee, Jacqueline Eun, NP  mirtazapine  (REMERON ) 15 MG tablet Take 1 tablet (15 mg total) by mouth at bedtime. 12/10/22 01/09/23  Lee, Jacqueline Eun, NP  Multiple Vitamin (MULTIVITAMIN WITH MINERALS) TABS tablet Take 1 tablet by mouth daily.    [provider]  PARoxetine  (PAXIL ) 20 MG tablet Take 1 tablet (20 mg total) by mouth daily. 12/10/22 01/09/23  Lee, Jacqueline Eun, NP  QUEtiapine  (SEROQUEL ) 50 MG tablet Take 1 tablet (50 mg total) by mouth at bedtime. 12/10/22 01/09/23  Lee, Jacqueline Eun, NP  QUEtiapine  (SEROQUEL ) 50 MG tablet Take 1 tablet (50 mg total) by mouth at bedtime. 12/24/22 01/23/23  Zelaya, Oscar A, PA-C  atomoxetine (STRATTERA) 10 MG  capsule Take 10 mg by mouth daily.  10/31/19  [provider]  omeprazole  (PRILOSEC) 20 MG capsule Take 1 capsule (20 mg total) by mouth 2 (two) times daily before a meal. 09/19/19 10/31/19  Darr, Jacob, PA-C      Allergies    Patient has no known allergies.    Review of Systems   Review of Systems  Physical Exam Updated Vital Signs BP 124/71 (BP Location: Left Arm)   Pulse 82   Temp (!) 97.5 F (36.4 C) (Oral)   Resp 16   SpO2 97%  Physical Exam Vitals and nursing note reviewed.  HENT:     Head: Normocephalic.  Cardiovascular:     Rate and Rhythm: Normal rate.     Pulses: Normal pulses.  Pulmonary:     Effort: Pulmonary effort is normal.  Musculoskeletal:        General: Tenderness present.       Feet:     Comments: Bilateral feet with calluses, tenderness with palpation but no significant swelling or wounds other than the dorsal surface of the left foot.  Neurological:     Mental Status: He is alert. Mental status is at baseline.  Psychiatric:     Comments: Cooperative     ED Results / Procedures / Treatments   Labs (all labs ordered are listed, but only abnormal results are  displayed) Labs Reviewed - No data to display  EKG None  Radiology No results found.  Procedures Procedures    Medications Ordered in ED Medications  ketorolac  (TORADOL ) 15 MG/ML injection 30 mg (has no administration in time range)    ED Course/ Medical Decision Making/ A&P                                 Medical Decision Making Risk Prescription drug management.   Patient here complaining of bilateral foot pain.  Evaluated earlier today for similar.  There is no evidence of infection of the feet.  He does have a superficial wound on the dorsal surface of the foot but no evidence of cellulitis.  No swelling noted of the feet.  Most likely due to the fact that he wears poor footwear and walks a lot.  Discussed with him following up with a podiatrist but he reports that he  does not think he will ever do that.  New bandage was placed on his foot and he was discharged.        Final Clinical Impression(s) / ED Diagnoses Final diagnoses:  Foot pain, bilateral    Rx / DC Orders ED Discharge Orders     None         Doretha Folks, MD 05/06/23 2110

## 2023-05-06 NOTE — ED Provider Notes (Signed)
 [place] EMERGENCY DEPARTMENT AT [facility] Provider Note   CSN: [id number] Arrival date & time: 05/06/23  0703     History  Chief Complaint  Patient presents with   Foot Pain    Shane Small is a 38 y.o. male with PMHx anxiety, asthma, headaches, depression, paranoid schizophrenia, stroke who presents to ED concerned for BL feet pain. Patient has history of plantar fasciitis and this foot pain is chronic for him. Patient also has a healing wound on the dorsal side of his left foot that occurred around 1 week ago after athletes tape ripped his skin. Patient denies any infectious symptoms today. He is asking for wraps for his foot pain.   Foot Pain       Home Medications Prior to Admission medications   Medication Sig Start Date End Date Taking? Authorizing Provider  albuterol  (VENTOLIN  HFA) 108 (90 Base) MCG/ACT inhaler Inhale 2 puffs into the lungs every 4 (four) hours as needed for wheezing or shortness of breath. 02/11/23 03/13/23  Henderly, Britni A, PA-C  cephALEXin  (KEFLEX ) 500 MG capsule 2 caps po bid x 7 days 12/22/22   Emil Share, DO  hydrOXYzine  (ATARAX ) 50 MG tablet Take 1 tablet (50 mg total) by mouth daily as needed for anxiety. 12/10/22   Lee, Jacqueline Eun, NP  mirtazapine  (REMERON ) 15 MG tablet Take 1 tablet (15 mg total) by mouth at bedtime. 12/10/22 01/09/23  Lee, Jacqueline Eun, NP  Multiple Vitamin (MULTIVITAMIN WITH MINERALS) TABS tablet Take 1 tablet by mouth daily.    [provider]  PARoxetine  (PAXIL ) 20 MG tablet Take 1 tablet (20 mg total) by mouth daily. 12/10/22 01/09/23  Lee, Jacqueline Eun, NP  QUEtiapine  (SEROQUEL ) 50 MG tablet Take 1 tablet (50 mg total) by mouth at bedtime. 12/10/22 01/09/23  Lee, Jacqueline Eun, NP  QUEtiapine  (SEROQUEL ) 50 MG tablet Take 1 tablet (50 mg total) by mouth at bedtime. 12/24/22 01/23/23  Zelaya, Oscar A, PA-C  atomoxetine (STRATTERA) 10 MG capsule Take 10 mg by mouth daily.  10/31/19  [provider]  omeprazole  (PRILOSEC) 20 MG capsule Take 1 capsule (20 mg total) by mouth 2 (two) times daily before a meal. 09/19/19 10/31/19  Darr, Jacob, PA-C      Allergies    Patient has no known allergies.    Review of Systems   Review of Systems  Musculoskeletal:        Foot pain    Physical Exam Updated Vital Signs BP (!) 133/97   Pulse 84   Temp 98 F (36.7 C)   Resp 18   SpO2 97%  Physical Exam Vitals and nursing note reviewed.  Constitutional:      General: He is not in acute distress.    Appearance: He is not ill-appearing or toxic-appearing.  HENT:     Head: Normocephalic and atraumatic.  Eyes:     General: No scleral icterus.       Right eye: No discharge.        Left eye: No discharge.     Conjunctiva/sclera: Conjunctivae normal.  Cardiovascular:     Rate and Rhythm: Normal rate.  Pulmonary:     Effort: Pulmonary effort is normal.  Abdominal:     General: Abdomen is flat.  Skin:    General: Skin is warm and dry.     Comments: 3cmx2cm superficial skin tear present on dorsal side of left foot. Granulation tissue appears clean and slightly moist. Brisk capillary refill of  this wound. +2 pedal pulses. No swelling, spreading erythema, or purulence appreciated. Area seem to be healing appropriately.  Neurological:     General: No focal deficit present.     Mental Status: He is alert. Mental status is at baseline.  Psychiatric:        Mood and Affect: Mood normal.        Behavior: Behavior normal.     ED Results / Procedures / Treatments   Labs (all labs ordered are listed, but only abnormal results are displayed) Labs Reviewed - No data to display  EKG None  Radiology No results found.  Procedures Procedures    Medications Ordered in ED Medications  bacitracin  ointment (has no administration in time range)  ketorolac  (TORADOL ) 15 MG/ML injection 15 mg (has no administration in time range)    ED Course/ Medical Decision Making/ A&P                                  Medical Decision Making  This patient presents to the ED for concern of feet pain, this involves an extensive number of treatment options, and is a complaint that carries with it a high risk of complications and morbidity.  The differential diagnosis includes gout, septic joint, hemarthrosis, cellulitis, abscess, osteomyelitis   Co morbidities that complicate the patient evaluation  anxiety, asthma, headaches, depression, paranoid schizophrenia, stroke, plantar faciitis    Additional history obtained:  Additional history obtained from 04/29/2023 ED note: patient with skin tear. Area was debrided and dressed appropriately. 03/2023 BL foot xrays: no acute process   Problem List / ED Course / Critical interventions / Medication management  Patient presents to ED concern for BL feet pain.  Patient with a history of plantar fasciitis/pes planus and this pain is chronic for him.  Patient stating his last dose of Tylenol  was last night.  Physical exam with skin tear on the dorsal side of the patient's left foot that seems to be healing appropriately.  Rest of physical exam reassuring.  Patient afebrile with stable vitals.  Patient denying any infectious complaint today.  Stating that he wants his feet wrapped. X-rays obtained last month of BL feet were reassuring.  Patient without recent trauma. I was able to clean and apply nonadherent dressing and antibiotic ointment to this wound.  Patient without history of CKD, GI bleed, GI surgery -so I provided him with a dose of Toradol  in ED which he tolerated well. 03/2023 CMP reviewed and reassuring.  I have reviewed the patients home medicines and have made adjustments as needed Patient afebrile with stable vitals.  Provided with return precautions.  Discharged in good condition.   Social Determinants of Health:  none         Final Clinical Impression(s) / ED Diagnoses Final diagnoses:  Plantar fasciitis of left  foot  Plantar fasciitis of right foot  Encounter for wound care    Rx / DC Orders ED Discharge Orders     None         Hoy Nidia JULIANNA DEVONNA 05/06/23 0803    Laurice Maude BROCKS, MD 05/06/23 [phone]

## 2023-05-06 NOTE — ED Triage Notes (Signed)
 Patient arrived with complaints of bilateral feet pain over the last few weeks.

## 2023-05-06 NOTE — ED Triage Notes (Signed)
 C/o bilat foot pain for several weeks. Feet hurt when walking. Pt has well healing wound on top of L foot.  Pt asking for wraps to relieve pain in feet.

## 2023-05-06 NOTE — ED Triage Notes (Signed)
 Patient is complaining of right foot pain. Patient was seen this am for the same thing. Patient is intoxicated and slurring his words. States his foot is hurting and it needs to be wrapped. Patient has an ace wrap in place on his right foot.

## 2023-06-05 ENCOUNTER — Emergency Department (HOSPITAL_COMMUNITY)
Admission: EM | Admit: 2023-06-05 | Discharge: 2023-06-05 | Disposition: A | Discharge status: Home / Self Care | Payer: MEDICAID | Attending: Emergency Medicine | Admitting: Emergency Medicine

## 2023-06-05 ENCOUNTER — Other Ambulatory Visit: Payer: Self-pay

## 2023-06-05 ENCOUNTER — Encounter (HOSPITAL_COMMUNITY): Payer: Self-pay

## 2023-06-05 DIAGNOSIS — Z20822 Contact with and (suspected) exposure to covid-19: Secondary | ICD-10-CM | POA: Diagnosis not present

## 2023-06-05 DIAGNOSIS — J101 Influenza due to other identified influenza virus with other respiratory manifestations: Secondary | ICD-10-CM | POA: Insufficient documentation

## 2023-06-05 DIAGNOSIS — R509 Fever, unspecified: Secondary | ICD-10-CM | POA: Diagnosis present

## 2023-06-05 LAB — RESP PANEL BY RT-PCR (RSV, FLU A&B, COVID)  RVPGX2
Influenza A by PCR: POSITIVE — AB
Influenza B by PCR: NEGATIVE
Resp Syncytial Virus by PCR: NEGATIVE
SARS Coronavirus 2 by RT PCR: NEGATIVE

## 2023-06-05 NOTE — ED Provider Notes (Signed)
 [place] EMERGENCY DEPARTMENT AT [facility] Provider Note   CSN: [id number] Arrival date & time: 06/05/23  9088     History  Chief Complaint  Patient presents with   Generalized Body Aches   Fever    Shane Small is a 38 y.o. male.  Patient with a complaint of flulike illness cough congestion body aches subjective fevers ongoing for 4 to 5 days.  No nausea no vomiting.  Past medical history significant for asthma schizophrenia patient is an everyday smoker.  Patient appears nontoxic no acute distress.  Vital signs very reassuring.       Home Medications Prior to Admission medications   Medication Sig Start Date End Date Taking? Authorizing Provider  albuterol  (VENTOLIN  HFA) 108 (90 Base) MCG/ACT inhaler Inhale 2 puffs into the lungs every 4 (four) hours as needed for wheezing or shortness of breath. 02/11/23 03/13/23  Henderly, Britni A, PA-C  cephALEXin  (KEFLEX ) 500 MG capsule 2 caps po bid x 7 days 12/22/22   Floyd, Dan, DO  hydrOXYzine  (ATARAX ) 50 MG tablet Take 1 tablet (50 mg total) by mouth daily as needed for anxiety. 12/10/22   Lee, Jacqueline Eun, NP  mirtazapine  (REMERON ) 15 MG tablet Take 1 tablet (15 mg total) by mouth at bedtime. 12/10/22 01/09/23  Lee, Jacqueline Eun, NP  Multiple Vitamin (MULTIVITAMIN WITH MINERALS) TABS tablet Take 1 tablet by mouth daily.    [provider]  PARoxetine  (PAXIL ) 20 MG tablet Take 1 tablet (20 mg total) by mouth daily. 12/10/22 01/09/23  Lee, Jacqueline Eun, NP  QUEtiapine  (SEROQUEL ) 50 MG tablet Take 1 tablet (50 mg total) by mouth at bedtime. 12/10/22 01/09/23  Lee, Jacqueline Eun, NP  QUEtiapine  (SEROQUEL ) 50 MG tablet Take 1 tablet (50 mg total) by mouth at bedtime. 12/24/22 01/23/23  Zelaya, Oscar A, PA-C  atomoxetine (STRATTERA) 10 MG capsule Take 10 mg by mouth daily.  10/31/19  [provider]  omeprazole  (PRILOSEC) 20 MG capsule Take 1 capsule (20 mg total) by mouth 2 (two) times daily before a meal.  09/19/19 10/31/19  Darr, Jacob, PA-C      Allergies    Patient has no known allergies.    Review of Systems   Review of Systems  Constitutional:  Negative for chills and fever.  HENT:  Positive for congestion. Negative for ear pain and sore throat.   Eyes:  Negative for pain and visual disturbance.  Respiratory:  Positive for cough. Negative for shortness of breath.   Cardiovascular:  Negative for chest pain and palpitations.  Gastrointestinal:  Negative for abdominal pain and vomiting.  Genitourinary:  Negative for dysuria and hematuria.  Musculoskeletal:  Positive for myalgias. Negative for arthralgias and back pain.  Skin:  Negative for color change and rash.  Neurological:  Negative for seizures and syncope.  All other systems reviewed and are negative.   Physical Exam Updated Vital Signs BP (!) 144/91 (BP Location: Left Arm)   Pulse 73   Temp 97.8 F (36.6 C) (Oral)   Resp 16   Ht 1.803 m (5' 11)   Wt 77.1 kg   SpO2 97%   BMI 23.71 kg/m  Physical Exam Vitals and nursing note reviewed.  Constitutional:      General: He is not in acute distress.    Appearance: Normal appearance. He is well-developed. He is not ill-appearing.  HENT:     Head: Normocephalic and atraumatic.     Mouth/Throat:     Mouth: Mucous membranes  are moist.     Pharynx: Oropharynx is clear. No oropharyngeal exudate or posterior oropharyngeal erythema.  Eyes:     Extraocular Movements: Extraocular movements intact.     Conjunctiva/sclera: Conjunctivae normal.     Pupils: Pupils are equal, round, and reactive to light.  Cardiovascular:     Rate and Rhythm: Normal rate and regular rhythm.     Heart sounds: No murmur heard. Pulmonary:     Effort: Pulmonary effort is normal. No respiratory distress.     Breath sounds: Normal breath sounds. No wheezing.  Abdominal:     Palpations: Abdomen is soft.     Tenderness: There is no abdominal tenderness.  Musculoskeletal:        General: No swelling.      Cervical back: Normal range of motion and neck supple.  Skin:    General: Skin is warm and dry.     Capillary Refill: Capillary refill takes less than 2 seconds.  Neurological:     General: No focal deficit present.     Mental Status: He is alert and oriented to person, place, and time.  Psychiatric:        Mood and Affect: Mood normal.     ED Results / Procedures / Treatments   Labs (all labs ordered are listed, but only abnormal results are displayed) Labs Reviewed  RESP PANEL BY RT-PCR (RSV, FLU A&B, COVID)  RVPGX2 - Abnormal; Notable for the following components:      Result Value   Influenza A by PCR POSITIVE (*)    All other components within normal limits    EKG None  Radiology No results found.  Procedures Procedures    Medications Ordered in ED Medications - No data to display  ED Course/ Medical Decision Making/ A&P                                 Medical Decision Making  Patient's respiratory panel positive for influenza A.  Patient nontoxic no acute distress.  Vital signs very reassuring.  Patient be treated with symptomatic treatment.  Precautions provided.  Final Clinical Impression(s) / ED Diagnoses Final diagnoses:  Influenza A    Rx / DC Orders ED Discharge Orders     None         Geraldene Hamilton, MD 06/07/23 [phone]

## 2023-06-05 NOTE — Discharge Instructions (Signed)
 Return for any new or worse symptoms to include difficulty breathing.  Testing today is positive influenza A.  Since you are about 4 to 5 days into this she will probably do fine.  Recommend over-the-counter cold and flu medicine.

## 2023-06-05 NOTE — ED Triage Notes (Signed)
 Patient is here for evaluation of cough, congestion, generalized body aches and subjective fevers. Reports this has been going on for the past 4-5 days. No other complaints.

## 2023-06-10 ENCOUNTER — Encounter (HOSPITAL_COMMUNITY): Payer: Self-pay

## 2023-06-10 ENCOUNTER — Emergency Department (HOSPITAL_COMMUNITY)
Admission: EM | Admit: 2023-06-10 | Discharge: 2023-06-10 | Discharge status: Against Medical Advice | Payer: MEDICAID | Attending: Emergency Medicine | Admitting: Emergency Medicine

## 2023-06-10 ENCOUNTER — Other Ambulatory Visit: Payer: Self-pay

## 2023-06-10 DIAGNOSIS — M25571 Pain in right ankle and joints of right foot: Secondary | ICD-10-CM | POA: Diagnosis not present

## 2023-06-10 DIAGNOSIS — Z5321 Procedure and treatment not carried out due to patient leaving prior to being seen by health care provider: Secondary | ICD-10-CM | POA: Diagnosis not present

## 2023-06-10 DIAGNOSIS — M79671 Pain in right foot: Secondary | ICD-10-CM | POA: Diagnosis present

## 2023-06-10 NOTE — ED Triage Notes (Signed)
 Right Ankle pain for 4-5 months, pt states he has fungus on his foot. Pt states he has been here for the same issue and nothing was done. Denies injury

## 2023-06-11 ENCOUNTER — Encounter (HOSPITAL_COMMUNITY): Payer: Self-pay

## 2023-06-11 ENCOUNTER — Emergency Department (HOSPITAL_COMMUNITY): Payer: MEDICAID

## 2023-06-11 ENCOUNTER — Emergency Department (HOSPITAL_COMMUNITY)
Admission: EM | Admit: 2023-06-11 | Discharge: 2023-06-11 | Disposition: A | Discharge status: Home / Self Care | Payer: MEDICAID | Attending: Emergency Medicine | Admitting: Emergency Medicine

## 2023-06-11 DIAGNOSIS — J45909 Unspecified asthma, uncomplicated: Secondary | ICD-10-CM | POA: Diagnosis not present

## 2023-06-11 DIAGNOSIS — W2209XA Striking against other stationary object, initial encounter: Secondary | ICD-10-CM | POA: Insufficient documentation

## 2023-06-11 DIAGNOSIS — B353 Tinea pedis: Secondary | ICD-10-CM | POA: Diagnosis not present

## 2023-06-11 DIAGNOSIS — Z7951 Long term (current) use of inhaled steroids: Secondary | ICD-10-CM | POA: Insufficient documentation

## 2023-06-11 DIAGNOSIS — M79641 Pain in right hand: Secondary | ICD-10-CM | POA: Diagnosis present

## 2023-06-11 DIAGNOSIS — S60221A Contusion of right hand, initial encounter: Secondary | ICD-10-CM | POA: Diagnosis not present

## 2023-06-11 MED ORDER — TERBINAFINE HCL 1 % EX CREA
TOPICAL_CREAM | Freq: Once | CUTANEOUS | Status: AC
  Filled 2023-06-11: qty 12

## 2023-06-11 MED ORDER — NAPROXEN 500 MG PO TABS
500.0000 mg | ORAL_TABLET | Freq: Once | ORAL | Status: AC
  Administered 2023-06-11: 500 mg via ORAL
  Filled 2023-06-11: qty 1

## 2023-06-11 NOTE — Discharge Instructions (Signed)
As discussed, your imaging is reassuring, there is no fracture or dislocations of the bones in your hands.  Alternate between ibuprofen and Tylenol every 4 hours as needed for pain and swelling.  You can also ice the hand at home.  Use terbinafine for the next week to help with athlete's foot.  I have provided information for podiatry that he can follow-up with for further evaluation.  Get help right away if: You have very bad pain. You have a loss of feeling (numbness) in a hand or foot. Your hand or foot turns pale or cold.

## 2023-06-11 NOTE — ED Provider Notes (Signed)
 [place] EMERGENCY DEPARTMENT AT [facility] Provider Note   CSN: [id number] Arrival date & time: 06/11/23  1145     History  Chief Complaint  Patient presents with   Hand Pain    Shane Small is a 38 y.o. male with a history of schizophrenia, polysubstance abuse, and asthma who presents the ED today for hand pain.  Patient reports that several hours prior to arrival he punched a brick wall with his right hand.  Has some abrasions to the knuckles but there is no active bleeding.  No weakness, loss of sensation, or impaired range of motion of the hands.  Tetanus is up-to-date. Additionally, patient endorses history of athlete's foot.  He is requesting medication for it at this time.  No pain or difficulty with ambulation of the foot.  No additional complaints or concerns at this time.    Home Medications Prior to Admission medications   Medication Sig Start Date End Date Taking? Authorizing Provider  albuterol (VENTOLIN HFA) 108 (90 Base) MCG/ACT inhaler Inhale 2 puffs into the lungs every 4 (four) hours as needed for wheezing or shortness of breath. 02/11/23 03/13/23  Henderly, Britni A, PA-C  cephALEXin (KEFLEX) 500 MG capsule 2 caps po bid x 7 days 12/22/22   Melene Plan, DO  hydrOXYzine (ATARAX) 50 MG tablet Take 1 tablet (50 mg total) by mouth daily as needed for anxiety. 12/10/22   Lauree Chandler, NP  mirtazapine (REMERON) 15 MG tablet Take 1 tablet (15 mg total) by mouth at bedtime. 12/10/22 01/09/23  Lauree Chandler, NP  Multiple Vitamin (MULTIVITAMIN WITH MINERALS) TABS tablet Take 1 tablet by mouth daily.    [provider]  PARoxetine (PAXIL) 20 MG tablet Take 1 tablet (20 mg total) by mouth daily. 12/10/22 01/09/23  Lauree Chandler, NP  QUEtiapine (SEROQUEL) 50 MG tablet Take 1 tablet (50 mg total) by mouth at bedtime. 12/10/22 01/09/23  Lauree Chandler, NP  QUEtiapine (SEROQUEL) 50 MG tablet Take 1 tablet (50 mg total) by mouth at bedtime.  12/24/22 01/23/23  Smitty Knudsen, PA-C  atomoxetine (STRATTERA) 10 MG capsule Take 10 mg by mouth daily.  10/31/19  [provider]  omeprazole (PRILOSEC) 20 MG capsule Take 1 capsule (20 mg total) by mouth 2 (two) times daily before a meal. 09/19/19 10/31/19  Darr, Gerilyn Pilgrim, PA-C      Allergies    Patient has no known allergies.    Review of Systems   Review of Systems  Musculoskeletal:  Positive for arthralgias.  All other systems reviewed and are negative.   Physical Exam Updated Vital Signs BP 133/89 (BP Location: Right Arm)   Pulse 78   Temp 98.6 F (37 C)   Resp 16   SpO2 100%  Physical Exam Vitals and nursing note reviewed.  Constitutional:      General: He is not in acute distress.    Appearance: Normal appearance.  HENT:     Head: Normocephalic and atraumatic.     Mouth/Throat:     Mouth: Mucous membranes are moist.  Eyes:     Conjunctiva/sclera: Conjunctivae normal.     Pupils: Pupils are equal, round, and reactive to light.  Cardiovascular:     Rate and Rhythm: Normal rate and regular rhythm.     Pulses: Normal pulses.  Pulmonary:     Effort: Pulmonary effort is normal.     Breath sounds: Normal breath sounds.  Abdominal:     Palpations: Abdomen  is soft.     Tenderness: There is no abdominal tenderness.  Musculoskeletal:        General: Tenderness present. Normal range of motion.     Comments: Tenderness to palpation of the DIP and MCP joints of the index and ring finger of the right hand.  Full range of motion of the right hand.  Strength and sensation intact.  Tenderness to palpation of the toes with discoloration of the toenails of the right foot.  Skin:    General: Skin is warm and dry.     Findings: No rash.  Neurological:     General: No focal deficit present.     Mental Status: He is alert.     Sensory: No sensory deficit.     Motor: No weakness.  Psychiatric:        Mood and Affect: Mood normal.        Behavior: Behavior normal.    ED  Results / Procedures / Treatments   Labs (all labs ordered are listed, but only abnormal results are displayed) Labs Reviewed - No data to display  EKG None  Radiology DG Hand Complete Right Result Date: 06/11/2023 CLINICAL DATA:  Hand injury and pain. EXAM: RIGHT HAND - COMPLETE 3+ VIEW COMPARISON:  01/01/2022. FINDINGS: No acute fracture or dislocation. No aggressive osseous lesion. Probable old avulsion fracture noted arising from the base of the distal phalanx of ring finger, dorsally (mallet finger). No significant arthritis of imaged joints. No radiopaque foreign bodies. Soft tissues are within normal limits. IMPRESSION: No acute osseous abnormality of the right hand. Electronically Signed   By: Jules Schick M.D.   On: 06/11/2023 15:52    Procedures Procedures: not indicated.   Medications Ordered in ED Medications  naproxen (NAPROSYN) tablet 500 mg (500 mg Oral Given 06/11/23 1634)  terbinafine (LAMISIL) 1 % cream ( Topical Provided for home use 06/11/23 1635)    ED Course/ Medical Decision Making/ A&P                                 Medical Decision Making Amount and/or Complexity of Data Reviewed Radiology: ordered.  Risk OTC drugs. Prescription drug management.   This patient presents to the ED for concern of hand pain, this involves an extensive number of treatment options, and is a complaint that carries with it a high risk of complications and morbidity.   Differential diagnosis includes: Fracture, dislocation, contusion, abrasion, laceration, etc.   Comorbidities  See HPI above   Additional History  Additional history obtained from prior records   Imaging Studies  I ordered imaging studies including right hand x-ray  I independently visualized and interpreted imaging which showed: No acute osseous findings of the right hand I agree with the radiologist interpretation   Problem List / ED Course / Critical Interventions / Medication  Management  Right hand pain after punching brick wall prior to ED I ordered medications including: Naproxen and ice for pain Terbinafine cream for foot fungus Medications given prior to discharge I have reviewed the patients home medicines and have made adjustments as needed Abrasions were cleaned and dressed prior to discharge. Discussed findings with patient.  All questions were answered.   Social Determinants of Health  Physical activity   Test / Admission - Considered  He is stable and safe discharge home. Return precautions given.       Final Clinical Impression(s) / ED Diagnoses  Final diagnoses:  Contusion of right hand, initial encounter  Tinea pedis of right foot    Rx / DC Orders ED Discharge Orders     None         Maxwell Marion, PA-C 06/11/23 1855    Theresia Lo [place] K, DO 06/19/23 [phone]

## 2023-06-11 NOTE — ED Triage Notes (Signed)
Pt presents with c/o right hand pain after punching a brick wall approx one hour ago. Pt has some scratches and bleeding to his right hand and knuckles. Bleeding controlled.

## 2023-06-28 ENCOUNTER — Encounter (HOSPITAL_COMMUNITY): Payer: Self-pay

## 2023-06-28 ENCOUNTER — Other Ambulatory Visit: Payer: Self-pay

## 2023-06-28 ENCOUNTER — Emergency Department (HOSPITAL_COMMUNITY)
Admission: EM | Admit: 2023-06-28 | Discharge: 2023-06-28 | Disposition: A | Discharge status: Home / Self Care | Payer: MEDICAID | Attending: Emergency Medicine | Admitting: Emergency Medicine

## 2023-06-28 DIAGNOSIS — J45901 Unspecified asthma with (acute) exacerbation: Secondary | ICD-10-CM | POA: Insufficient documentation

## 2023-06-28 DIAGNOSIS — J4521 Mild intermittent asthma with (acute) exacerbation: Secondary | ICD-10-CM

## 2023-06-28 DIAGNOSIS — Z760 Encounter for issue of repeat prescription: Secondary | ICD-10-CM | POA: Insufficient documentation

## 2023-06-28 MED ORDER — ALBUTEROL SULFATE HFA 108 (90 BASE) MCG/ACT IN AERS
2.0000 | INHALATION_SPRAY | Freq: Once | RESPIRATORY_TRACT | Status: AC
  Administered 2023-06-28: 2 via RESPIRATORY_TRACT
  Filled 2023-06-28: qty 6.7

## 2023-06-28 MED ORDER — DEXAMETHASONE 2 MG PO TABS
10.0000 mg | ORAL_TABLET | Freq: Once | ORAL | Status: AC
  Administered 2023-06-28: 10 mg via ORAL
  Filled 2023-06-28: qty 1

## 2023-06-28 NOTE — ED Triage Notes (Signed)
 Patient is here for medication refill for his inhaler.

## 2023-06-28 NOTE — Discharge Instructions (Signed)
 Follow up with your doctor in the office.  You can use the inhaler up to every 4 hours while you are awake.  Return for worsening symptoms.

## 2023-06-28 NOTE — ED Provider Notes (Signed)
 [place] EMERGENCY DEPARTMENT AT [facility] Provider Note   CSN: [id number] Arrival date & time: 06/28/23  4098     History  Chief Complaint  Patient presents with   Medication Refill    Shane Small is a 38 y.o. male.  38 yo M with a chief complaints of running out of his albuterol inhaler.  He has had a little bit of trouble breathing over the past couple days.  Denies cough denies fever.   Medication Refill      Home Medications Prior to Admission medications   Medication Sig Start Date End Date Taking? Authorizing Provider  albuterol (VENTOLIN HFA) 108 (90 Base) MCG/ACT inhaler Inhale 2 puffs into the lungs every 4 (four) hours as needed for wheezing or shortness of breath. 02/11/23 03/13/23  Henderly, Britni A, PA-C  cephALEXin (KEFLEX) 500 MG capsule 2 caps po bid x 7 days 12/22/22   Melene Plan, DO  hydrOXYzine (ATARAX) 50 MG tablet Take 1 tablet (50 mg total) by mouth daily as needed for anxiety. 12/10/22   Lauree Chandler, NP  mirtazapine (REMERON) 15 MG tablet Take 1 tablet (15 mg total) by mouth at bedtime. 12/10/22 01/09/23  Lauree Chandler, NP  Multiple Vitamin (MULTIVITAMIN WITH MINERALS) TABS tablet Take 1 tablet by mouth daily.    [provider]  PARoxetine (PAXIL) 20 MG tablet Take 1 tablet (20 mg total) by mouth daily. 12/10/22 01/09/23  Lauree Chandler, NP  QUEtiapine (SEROQUEL) 50 MG tablet Take 1 tablet (50 mg total) by mouth at bedtime. 12/10/22 01/09/23  Lauree Chandler, NP  QUEtiapine (SEROQUEL) 50 MG tablet Take 1 tablet (50 mg total) by mouth at bedtime. 12/24/22 01/23/23  Smitty Knudsen, PA-C  atomoxetine (STRATTERA) 10 MG capsule Take 10 mg by mouth daily.  10/31/19  [provider]  omeprazole (PRILOSEC) 20 MG capsule Take 1 capsule (20 mg total) by mouth 2 (two) times daily before a meal. 09/19/19 10/31/19  Darr, Gerilyn Pilgrim, PA-C      Allergies    Patient has no known allergies.    Review of Systems   Review of  Systems  Physical Exam Updated Vital Signs BP (!) 158/104   Pulse 94   Temp 97.6 F (36.4 C) (Oral)   Resp 17   Ht 5\' 11"  (1.803 m)   Wt 82.1 kg   SpO2 100%   BMI 25.24 kg/m  Physical Exam Vitals and nursing note reviewed.  Constitutional:      Appearance: He is well-developed.  HENT:     Head: Normocephalic and atraumatic.  Eyes:     Pupils: Pupils are equal, round, and reactive to light.  Neck:     Vascular: No JVD.  Cardiovascular:     Rate and Rhythm: Normal rate and regular rhythm.     Heart sounds: No murmur heard.    No friction rub. No gallop.  Pulmonary:     Effort: No respiratory distress.     Breath sounds: No wheezing.  Abdominal:     General: There is no distension.     Tenderness: There is no abdominal tenderness. There is no guarding or rebound.  Musculoskeletal:        General: Normal range of motion.     Cervical back: Normal range of motion and neck supple.  Skin:    Coloration: Skin is not pale.     Findings: No rash.  Neurological:     Mental Status: He is alert  and oriented to person, place, and time.  Psychiatric:        Behavior: Behavior normal.     ED Results / Procedures / Treatments   Labs (all labs ordered are listed, but only abnormal results are displayed) Labs Reviewed - No data to display  EKG None  Radiology No results found.  Procedures Procedures    Medications Ordered in ED Medications  dexamethasone (DECADRON) tablet 10 mg (has no administration in time range)  albuterol (VENTOLIN HFA) 108 (90 Base) MCG/ACT inhaler 2 puff (has no administration in time range)    ED Course/ Medical Decision Making/ A&P                                 Medical Decision Making Risk Prescription drug management.   38 yo M with a chief complaint of running out of his albuterol inhaler at home.  Says he has been having some trouble breathing and felt he needed to use it at home.  Clear lung sounds for me here.  Will provide him  with an inhaler to take home.  Single dose of Decadron.  PCP follow-up.  10:10 AM:  I have discussed the diagnosis/risks/treatment options with the patient.  Evaluation and diagnostic testing in the emergency department does not suggest an emergent condition requiring admission or immediate intervention beyond what has been performed at this time.  They will follow up with PCP. We also discussed returning to the ED immediately if new or worsening sx occur. We discussed the sx which are most concerning (e.g., sudden worsening pain, fever, inability to tolerate by mouth, need to use the inhaler more often than every 4 hours) that necessitate immediate return. Medications administered to the patient during their visit and any new prescriptions provided to the patient are listed below.  Medications given during this visit Medications  dexamethasone (DECADRON) tablet 10 mg (has no administration in time range)  albuterol (VENTOLIN HFA) 108 (90 Base) MCG/ACT inhaler 2 puff (has no administration in time range)     The patient appears reasonably screen and/or stabilized for discharge and I doubt any other medical condition or other [facility] requiring further screening, evaluation, or treatment in the ED at this time prior to discharge.          Final Clinical Impression(s) / ED Diagnoses Final diagnoses:  Mild intermittent asthma with exacerbation    Rx / DC Orders ED Discharge Orders     None         Melene Plan, DO 06/28/23 1011

## 2023-07-02 ENCOUNTER — Emergency Department (HOSPITAL_COMMUNITY)
Admission: EM | Admit: 2023-07-02 | Discharge: 2023-07-02 | Disposition: A | Discharge status: Home / Self Care | Payer: MEDICAID

## 2023-07-02 DIAGNOSIS — R112 Nausea with vomiting, unspecified: Secondary | ICD-10-CM | POA: Insufficient documentation

## 2023-07-02 DIAGNOSIS — R1111 Vomiting without nausea: Secondary | ICD-10-CM

## 2023-07-02 MED ORDER — ONDANSETRON HCL 4 MG PO TABS: 4.0000 mg | ORAL_TABLET | Freq: Three times a day (TID) | ORAL | 0 refills | Status: AC | PRN

## 2023-07-02 NOTE — ED Triage Notes (Signed)
 Pt presents with vague symptoms. States that he had "seafood allergy" in the past, but now eats seafood frequently. Pt states that many weeks ago he ate shrimp and now feels as thought it can be causing him to have N/V. Pt denies pain currently. NAD noted during triage.

## 2023-07-02 NOTE — Discharge Instructions (Addendum)
 Please follow-up with your primary doctor for allergy testing.  Return immediately develop fevers, chills, chest pain, shortness of breath, lip swelling, tongue swelling, difficulty breathing, passout, diffuse hives/rash or he develop any new or worsening symptoms that are concerning to you.  We are prescribing you Zofran that you can use in case you develop nausea and vomiting as needed.

## 2023-07-02 NOTE — ED Provider Notes (Signed)
 [place] EMERGENCY DEPARTMENT AT [facility] Provider Note   CSN: [id number] Arrival date & time: 07/02/23  1345     History  Chief Complaint  Patient presents with   Emesis    Shane Small is a 38 y.o. male.  This is a 38 year old male presenting emergency department wanting to be tested for seafood allergy.  He reports he has had some intermittent nausea vomiting for the past 6 months.  He ate some pizza yesterday and had some nausea and vomiting thereafter.  He is concerned he may have food allergies.  No tongue or lip swelling, no shortness of breath.  No abdominal pain.   Emesis      Home Medications Prior to Admission medications   Medication Sig Start Date End Date Taking? Authorizing Provider  albuterol (VENTOLIN HFA) 108 (90 Base) MCG/ACT inhaler Inhale 2 puffs into the lungs every 4 (four) hours as needed for wheezing or shortness of breath. 02/11/23 03/13/23  Henderly, Britni A, PA-C  cephALEXin (KEFLEX) 500 MG capsule 2 caps po bid x 7 days 12/22/22   Melene Plan, DO  hydrOXYzine (ATARAX) 50 MG tablet Take 1 tablet (50 mg total) by mouth daily as needed for anxiety. 12/10/22   Lauree Chandler, NP  mirtazapine (REMERON) 15 MG tablet Take 1 tablet (15 mg total) by mouth at bedtime. 12/10/22 01/09/23  Lauree Chandler, NP  Multiple Vitamin (MULTIVITAMIN WITH MINERALS) TABS tablet Take 1 tablet by mouth daily.    [provider]  PARoxetine (PAXIL) 20 MG tablet Take 1 tablet (20 mg total) by mouth daily. 12/10/22 01/09/23  Lauree Chandler, NP  QUEtiapine (SEROQUEL) 50 MG tablet Take 1 tablet (50 mg total) by mouth at bedtime. 12/10/22 01/09/23  Lauree Chandler, NP  QUEtiapine (SEROQUEL) 50 MG tablet Take 1 tablet (50 mg total) by mouth at bedtime. 12/24/22 01/23/23  Smitty Knudsen, PA-C  atomoxetine (STRATTERA) 10 MG capsule Take 10 mg by mouth daily.  10/31/19  [provider]  omeprazole (PRILOSEC) 20 MG capsule Take 1 capsule (20 mg  total) by mouth 2 (two) times daily before a meal. 09/19/19 10/31/19  Darr, Gerilyn Pilgrim, PA-C      Allergies    Patient has no known allergies.    Review of Systems   Review of Systems  Gastrointestinal:  Positive for vomiting.    Physical Exam Updated Vital Signs BP (!) 150/100 (BP Location: Left Arm)   Pulse (!) 107   Temp 98.3 F (36.8 C) (Oral)   Resp 18   SpO2 98%  Physical Exam Vitals and nursing note reviewed.  Constitutional:      Appearance: Normal appearance.  HENT:     Head: Normocephalic.     Nose: Nose normal.     Mouth/Throat:     Mouth: Mucous membranes are moist.  Eyes:     Conjunctiva/sclera: Conjunctivae normal.  Cardiovascular:     Rate and Rhythm: Normal rate and regular rhythm.  Pulmonary:     Effort: Pulmonary effort is normal.  Abdominal:     General: Abdomen is flat. There is no distension.     Tenderness: There is no abdominal tenderness. There is no guarding or rebound.  Musculoskeletal:        General: Normal range of motion.  Skin:    General: Skin is warm and dry.     Capillary Refill: Capillary refill takes less than 2 seconds.  Neurological:     Mental Status: He  is alert and oriented to person, place, and time.  Psychiatric:        Behavior: Behavior normal.     ED Results / Procedures / Treatments   Labs (all labs ordered are listed, but only abnormal results are displayed) Labs Reviewed - No data to display  EKG None  Radiology No results found.  Procedures Procedures    Medications Ordered in ED Medications - No data to display  ED Course/ Medical Decision Making/ A&P                                 Medical Decision Making This is a 38 year old male presenting emergency department requesting allergy testing.  Vital signs are reassuring.  Not currently having an allergic reaction.  No angioedema, tongue swelling.  Clear lungs.  Benign abdominal exam.  Discussed with patient that we do not do allergy testing here in the  emergency department that he would have to follow-up with his primary doctor.  Stable for discharge at this time as patient is asymptomatic.         Final Clinical Impression(s) / ED Diagnoses Final diagnoses:  None    Rx / DC Orders ED Discharge Orders     None         Coral Spikes, DO 07/02/23 1516

## 2023-10-21 ENCOUNTER — Emergency Department (HOSPITAL_COMMUNITY)
Admission: EM | Admit: 2023-10-21 | Discharge: 2023-10-21 | Disposition: A | Discharge status: Home / Self Care | Payer: MEDICAID | Attending: Emergency Medicine | Admitting: Emergency Medicine

## 2023-10-21 ENCOUNTER — Encounter (HOSPITAL_COMMUNITY): Payer: Self-pay | Admitting: *Deleted

## 2023-10-21 ENCOUNTER — Other Ambulatory Visit: Payer: Self-pay

## 2023-10-21 DIAGNOSIS — R0602 Shortness of breath: Secondary | ICD-10-CM | POA: Diagnosis not present

## 2023-10-21 DIAGNOSIS — J45909 Unspecified asthma, uncomplicated: Secondary | ICD-10-CM | POA: Insufficient documentation

## 2023-10-21 DIAGNOSIS — Z760 Encounter for issue of repeat prescription: Secondary | ICD-10-CM | POA: Diagnosis present

## 2023-10-21 MED ORDER — ALBUTEROL SULFATE HFA 108 (90 BASE) MCG/ACT IN AERS
1.0000 | INHALATION_SPRAY | Freq: Once | RESPIRATORY_TRACT | Status: AC
Start: 2023-10-21 — End: 2023-10-21
  Administered 2023-10-21: 1 via RESPIRATORY_TRACT
  Filled 2023-10-21: qty 6.7

## 2023-10-21 MED ORDER — ALBUTEROL SULFATE HFA 108 (90 BASE) MCG/ACT IN AERS: 1.0000 | INHALATION_SPRAY | Freq: Four times a day (QID) | RESPIRATORY_TRACT | 5 refills | Status: AC | PRN

## 2023-10-21 NOTE — ED Provider Notes (Signed)
 [place] EMERGENCY DEPARTMENT AT [facility] Provider Note   CSN: [id number] Arrival date & time: 10/21/23  0930     Patient presents with: Medication Refill  HPI [facility] is a 38 y.o. male presenting for medication refill.  History of asthma and schizophrenia.  States he ran out of his albuterol  inhaler last week.  Reports that he has been intermittently short of breath and requesting an albuterol  inhaler today.  He denies chest pain at this time.  Denies any calf tenderness, recent long trips or immobilization.  Denies cough and fever.    Medication Refill      Prior to Admission medications   Medication Sig Start Date End Date Taking? Authorizing Provider  albuterol  (VENTOLIN  HFA) 108 (90 Base) MCG/ACT inhaler Inhale 1-2 puffs into the lungs every 6 (six) hours as needed for wheezing or shortness of breath. 10/21/23  Yes Natali Lavallee K, PA-C  cephALEXin  (KEFLEX ) 500 MG capsule 2 caps po bid x 7 days 12/22/22   Floyd, Dan, DO  hydrOXYzine  (ATARAX ) 50 MG tablet Take 1 tablet (50 mg total) by mouth daily as needed for anxiety. 12/10/22   Lee, Jacqueline Eun, NP  mirtazapine  (REMERON ) 15 MG tablet Take 1 tablet (15 mg total) by mouth at bedtime. 12/10/22 01/09/23  Lee, Jacqueline Eun, NP  Multiple Vitamin (MULTIVITAMIN WITH MINERALS) TABS tablet Take 1 tablet by mouth daily.    [provider]  ondansetron  (ZOFRAN ) 4 MG tablet Take 1 tablet (4 mg total) by mouth every 8 (eight) hours as needed for nausea or vomiting. 07/02/23   Neysa Caron PARAS, DO  PARoxetine  (PAXIL ) 20 MG tablet Take 1 tablet (20 mg total) by mouth daily. 12/10/22 01/09/23  Lee, Jacqueline Eun, NP  QUEtiapine  (SEROQUEL ) 50 MG tablet Take 1 tablet (50 mg total) by mouth at bedtime. 12/10/22 01/09/23  Lee, Jacqueline Eun, NP  QUEtiapine  (SEROQUEL ) 50 MG tablet Take 1 tablet (50 mg total) by mouth at bedtime. 12/24/22 01/23/23  Zelaya, Oscar A, PA-C  atomoxetine (STRATTERA) 10 MG capsule Take 10 mg by  mouth daily.  10/31/19  [provider]  omeprazole  (PRILOSEC) 20 MG capsule Take 1 capsule (20 mg total) by mouth 2 (two) times daily before a meal. 09/19/19 10/31/19  Darr, Lang, PA-C    Allergies: Patient has no known allergies.    Review of Systems See HPI  Updated Vital Signs BP 123/80   Pulse 78   Temp 98 F (36.7 C)   Resp 15   Wt 79.4 kg   SpO2 97%   BMI 24.41 kg/m   Physical Exam Vitals and nursing note reviewed.  HENT:     Head: Normocephalic and atraumatic.     Mouth/Throat:     Mouth: Mucous membranes are moist.   Eyes:     General:        Right eye: No discharge.        Left eye: No discharge.     Conjunctiva/sclera: Conjunctivae normal.    Cardiovascular:     Rate and Rhythm: Normal rate and regular rhythm.     Pulses: Normal pulses.     Heart sounds: Normal heart sounds.  Pulmonary:     Effort: Pulmonary effort is normal.     Breath sounds: Normal breath sounds and air entry. No decreased breath sounds, wheezing, rhonchi or rales.  Abdominal:     General: Abdomen is flat.     Palpations: Abdomen is soft.   Musculoskeletal:  Comments: No lower extremity edema   Skin:    General: Skin is warm and dry.   Neurological:     General: No focal deficit present.   Psychiatric:        Mood and Affect: Mood normal.     (all labs ordered are listed, but only abnormal results are displayed) Labs Reviewed - No data to display  EKG: None  Radiology: No results found.   Procedures   Medications Ordered in the ED  albuterol  (VENTOLIN  HFA) 108 (90 Base) MCG/ACT inhaler 1 puff (has no administration in time range)                                    Medical Decision Making  38 year old well-appearing male presenting for medication refill.  Exam was unremarkable.  He reported that he is mildly short of breath today but breath sounds are clear, no acute or respiratory distress, and vitals are normal did not feel that x-ray was  warranted.  Treated with 1 puff of albuterol  here.  Sent refills to his pharmacy.  Advised to follow-up with PCP.  Discussed return precautions.  Discharged.     Final diagnoses:  Medication refill    ED Discharge Orders          Ordered    albuterol  (VENTOLIN  HFA) 108 (90 Base) MCG/ACT inhaler  Every 6 hours PRN        10/21/23 1104               Telina Kleckley K, PA-C 10/21/23 1104    Dreama Longs, MD 10/21/23 2321

## 2023-10-21 NOTE — ED Triage Notes (Signed)
 Here by POV from home for medication refill. Clarifies, does not want an Rx, he wants an inhaler. States, I ran out of inhaler. Denies symptoms. Denies pain or sob. Alert, NAD, calm, resps e/u, speaking in clear complete sentences. Does not have PCP.

## 2023-10-21 NOTE — Discharge Instructions (Signed)
 Eval today was overall reassuring.  I sent albuterol  inhaler to your pharmacy along with refills.  Please follow-up with her PCP.  If you develop shortness of breath, chest pain, cough and fever or any other concerning symptom please return to the ED for further evaluation.

## 2023-10-21 NOTE — ED Notes (Signed)
 PT ambulated to bed independently, steady gate noted.

## 2024-01-22 ENCOUNTER — Emergency Department (HOSPITAL_COMMUNITY)
Admission: EM | Admit: 2024-01-22 | Discharge: 2024-01-22 | Disposition: A | Discharge status: Home / Self Care | Payer: MEDICAID | Attending: Emergency Medicine | Admitting: Emergency Medicine

## 2024-01-22 ENCOUNTER — Encounter (HOSPITAL_COMMUNITY): Payer: Self-pay

## 2024-01-22 ENCOUNTER — Other Ambulatory Visit: Payer: Self-pay

## 2024-01-22 DIAGNOSIS — T754XXA Electrocution, initial encounter: Secondary | ICD-10-CM | POA: Insufficient documentation
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
## 2024-04-13 ENCOUNTER — Encounter (HOSPITAL_COMMUNITY): Payer: Self-pay | Admitting: Emergency Medicine

## 2024-04-13 ENCOUNTER — Other Ambulatory Visit: Payer: Self-pay

## 2024-04-13 ENCOUNTER — Emergency Department (HOSPITAL_COMMUNITY)
Admission: EM | Admit: 2024-04-13 | Discharge: 2024-04-13 | Disposition: A | Payer: MEDICAID | Attending: Emergency Medicine | Admitting: Emergency Medicine

## 2024-04-13 DIAGNOSIS — L608 Other nail disorders: Secondary | ICD-10-CM | POA: Diagnosis present

## 2024-04-13 DIAGNOSIS — J45909 Unspecified asthma, uncomplicated: Secondary | ICD-10-CM | POA: Diagnosis not present

## 2024-04-13 DIAGNOSIS — B351 Tinea unguium: Secondary | ICD-10-CM

## 2024-04-13 MED ORDER — EFINACONAZOLE 10 % EX SOLN: 2.0000 [drp] | Freq: Every day | CUTANEOUS | 0 refills | Status: DC

## 2024-04-13 MED ORDER — EFINACONAZOLE 10 % EX SOLN: 2.0000 [drp] | Freq: Every day | CUTANEOUS | 0 refills | Status: AC

## 2024-04-13 NOTE — ED Provider Notes (Signed)
 [place] [facility] AT [facility] Provider Note   CSN: [id number] Arrival date & time: 04/13/24  0002     History Chief Complaint  Patient presents with   Foot Pain    HPI: Shane Small is a 38 y.o. male with history pertinent of schizophrenia, asthma who presents complaining of toenail discoloration. Patient arrived via POV.  History provided by patient.  No interpreter required during this encounter.  Patient reports that he is here for evaluation of his toenails.  Reports that he has had longstanding discoloration and thickening of his toenails, reports that he was seen by a different provider and was recommended over-the-counter medication, he does not recall the name of that medication, however feels that it has not been helping, therefore he wanted reevaluation and prescription medication.  Denies other complaints or concerns.  Triage note states that patient is concerned regarding toenails, there is another note from nursing in the chart that patient is concerned regarding his left eye, patient has no complaints regarding his left eye on my exam.  Patient's recorded medical, surgical, social, medication list and allergies were reviewed in the Snapshot window as part of the initial history.   Prior to Admission medications  Medication Sig Start Date End Date Taking? Authorizing Provider  Efinaconazole  10 % SOLN Apply 2 drops topically daily. Apply 2 drops topically daily to each affected toenail for 48 weeks 04/13/24  Yes Rogelia Jerilynn RAMAN, MD  albuterol  (VENTOLIN  HFA) [phone] Base) MCG/ACT inhaler Inhale 1-2 puffs into the lungs every 6 (six) hours as needed for wheezing or shortness of breath. 10/21/23   Robinson, John K, PA-C  cephALEXin  (KEFLEX ) 500 MG capsule 2 caps po bid x 7 days 12/22/22   Floyd, Dan, DO  hydrOXYzine  (ATARAX ) 50 MG tablet Take 1 tablet (50 mg total) by mouth daily as needed for anxiety. 12/10/22   Lee, Jacqueline Eun, NP   mirtazapine  (REMERON ) 15 MG tablet Take 1 tablet (15 mg total) by mouth at bedtime. 12/10/22 01/09/23  Lee, Jacqueline Eun, NP  Multiple Vitamin (MULTIVITAMIN WITH MINERALS) TABS tablet Take 1 tablet by mouth daily.    [provider]  ondansetron  (ZOFRAN ) 4 MG tablet Take 1 tablet (4 mg total) by mouth every 8 (eight) hours as needed for nausea or vomiting. 07/02/23   Neysa Caron PARAS, DO  PARoxetine  (PAXIL ) 20 MG tablet Take 1 tablet (20 mg total) by mouth daily. 12/10/22 01/09/23  Lee, Jacqueline Eun, NP  QUEtiapine  (SEROQUEL ) 50 MG tablet Take 1 tablet (50 mg total) by mouth at bedtime. 12/10/22 01/09/23  Lee, Jacqueline Eun, NP  QUEtiapine  (SEROQUEL ) 50 MG tablet Take 1 tablet (50 mg total) by mouth at bedtime. 12/24/22 01/23/23  Zelaya, Oscar A, PA-C  atomoxetine (STRATTERA) 10 MG capsule Take 10 mg by mouth daily.  10/31/19  [provider]  omeprazole  (PRILOSEC) 20 MG capsule Take 1 capsule (20 mg total) by mouth 2 (two) times daily before a meal. 09/19/19 10/31/19  Darr, Lang, PA-C     Allergies: Patient has no known allergies.   Review of Systems   ROS as per HPI  Physical Exam Updated Vital Signs BP (!) 164/114 (BP Location: Left Arm)   Pulse 72   Temp 98.2 F (36.8 C) (Oral)   Resp 12   SpO2 100%  Physical Exam Vitals and nursing note reviewed.  Constitutional:      General: He is not in acute distress.    Appearance: Normal appearance.  HENT:     Head: Normocephalic and atraumatic.  Eyes:     Extraocular Movements: Extraocular movements intact.  Cardiovascular:     Rate and Rhythm: Normal rate.     Pulses: Normal pulses.  Pulmonary:     Effort: Pulmonary effort is normal.  Skin:    General: Skin is warm and dry.     Capillary Refill: Capillary refill takes less than 2 seconds.     Comments: Discoloration and thickening of multiple toenails of bilateral feet concerning for onychomycosis, no surrounding erythema, purulence   Neurological:     General: No  focal deficit present.     Mental Status: He is alert and oriented to person, place, and time.        ED Course/ Medical Decision Making/ A&P    Procedures Procedures   Medications Ordered in ED Medications - No data to display  Medical Decision Making:   Sh[facility] Small is a 38 y.o. male who presents for toenail changes as per above.  Physical exam is pertinent for discoloration and thickening of multiple toenails of the bilateral feet.   The differential includes but is not limited to onychomycosis, paronychia, cellulitis, abscess.  Independent historian: None  External data reviewed: No pertinent external data  Labs: Not indicated  Radiology: Not indicated No results found.  EKG/Medicine tests: Not indicated EKG Interpretation:    Interventions: None  See the EMR for full details regarding lab and imaging results.  Patient presents to the emergency department for concerns regarding discoloration of his toenails.  Does have toenail changes concerning for onychomycosis, patient does not have any other complaints, is hemodynamically stable.  No skin changes concerning for cellulitis, paronychia, abscess.  Prescribed course of topical efinaconazole , and will refer to dermatology for further evaluation.  Presentation is most consistent with acute uncomplicated illness  Discussion of management or test interpretations with external provider(s): Not indicated  Risk Drugs:Prescription drug management  Disposition: DISCHARGE: I believe that the patient is safe for discharge home with outpatient follow-up. Patient was informed of all pertinent physical exam, laboratory, and imaging findings. Patient's suspected etiology of their symptom presentation was discussed with the patient and all questions were answered. We discussed following up with PCP and dermatology. I provided thorough ED return precautions. The patient feels safe and comfortable with this plan.  MDM  generated using voice dictation software and may contain dictation errors.  Please contact me for any clarification or with any questions.  Clinical Impression:  1. Onychomycosis      Discharge   Final Clinical Impression(s) / ED Diagnoses Final diagnoses:  Onychomycosis    Rx / DC Orders ED Discharge Orders          Ordered    Efinaconazole  10 % SOLN  Daily        04/13/24 0121    Ambulatory referral to Dermatology        04/13/24 0125             Rogelia Jerilynn RAMAN, MD 04/13/24 [phone]

## 2024-04-13 NOTE — ED Notes (Signed)
 Patient stated that his left eye has been bothering him. He has used eyedrops, however no relief.

## 2024-04-13 NOTE — Discharge Instructions (Addendum)
 [facility]  Thank you for allowing us  to take care of you today.  You came to the Emergency Department today because you have had a fungal infection of your toenails that has not gotten better with over-the-counter medications.  We are prescribing you a prescription medication that you can put on your toenails.  Additionally we are giving you referral to follow-up with a dermatologist (skin doctor) as they also take care of of nails and infections of nails.  To-Do: 1. Please follow-up with your primary doctor within 1 month / as soon as possible.   Please return to the Emergency Department or call 911 if you experience have worsening of your symptoms, or do not get better, chest pain, shortness of breath, severe or significantly worsening pain, high fever, severe confusion, pass out or have any reason to think that you need emergency medical care.   We hope you feel better soon.   Mitzie Later, MD Department of Emergency Medicine [facility] [place]

## 2024-04-13 NOTE — ED Triage Notes (Signed)
 Pt reports fungus in bilateral toenails. Reports he was told to try OTC medication & states it hasn't been helping.

## 2024-05-28 ENCOUNTER — Other Ambulatory Visit: Payer: Self-pay

## 2024-05-28 ENCOUNTER — Emergency Department (HOSPITAL_COMMUNITY): Payer: MEDICAID

## 2024-05-28 ENCOUNTER — Encounter (HOSPITAL_COMMUNITY): Payer: Self-pay

## 2024-05-28 ENCOUNTER — Emergency Department (HOSPITAL_COMMUNITY)
Admission: EM | Admit: 2024-05-28 | Discharge: 2024-05-28 | Disposition: A | Discharge status: Home / Self Care | Payer: MEDICAID | Attending: Emergency Medicine | Admitting: Emergency Medicine

## 2024-05-28 DIAGNOSIS — M7989 Other specified soft tissue disorders: Secondary | ICD-10-CM | POA: Diagnosis not present

## 2024-05-28 DIAGNOSIS — X58XXXA Exposure to other specified factors, initial encounter: Secondary | ICD-10-CM | POA: Diagnosis not present

## 2024-05-28 DIAGNOSIS — S93601A Unspecified sprain of right foot, initial encounter: Secondary | ICD-10-CM | POA: Insufficient documentation

## 2024-05-28 DIAGNOSIS — S99921A Unspecified injury of right foot, initial encounter: Secondary | ICD-10-CM | POA: Diagnosis present

## 2024-05-28 MED ORDER — NAPROXEN 500 MG PO TABS: 500.0000 mg | ORAL_TABLET | Freq: Two times a day (BID) | ORAL | 1 refills | Status: AC

## 2024-05-28 MED ORDER — NAPROXEN 500 MG PO TABS
500.0000 mg | ORAL_TABLET | Freq: Once | ORAL | Status: AC
  Administered 2024-05-28: 500 mg via ORAL
  Filled 2024-05-28: qty 1

## 2024-05-28 NOTE — ED Provider Notes (Signed)
 " [place] EMERGENCY DEPARTMENT AT [facility] Provider Note   CSN: [id number] Arrival date & time: 05/28/24  9344     Patient presents with: Ankle Pain   Sh[facility] Small is a 39 y.o. male.   Patient complains of pain in her right foot for several weeks.  No history of any injury  The history is provided by the patient and medical records.  Ankle Pain Location:  Foot and ankle Injury: no   Ankle location:  R ankle Pain details:    Quality:  Aching   Radiates to:  Does not radiate   Severity:  Mild   Onset quality:  Gradual   Timing:  Constant Chronicity:  New Associated symptoms: no back pain and no fatigue        Prior to Admission medications  Medication Sig Start Date End Date Taking? Authorizing Provider  naproxen  (NAPROSYN ) 500 MG tablet Take 1 tablet (500 mg total) by mouth 2 (two) times daily. 05/28/24  Yes Suzette Pac, MD  albuterol  (VENTOLIN  HFA) 108 [phone] Base) MCG/ACT inhaler Inhale 1-2 puffs into the lungs every 6 (six) hours as needed for wheezing or shortness of breath. 10/21/23   Robinson, John K, PA-C  cephALEXin  (KEFLEX ) 500 MG capsule 2 caps po bid x 7 days 12/22/22   Emil Share, DO  Efinaconazole  10 % SOLN Apply 2 drops topically daily. Apply 2 drops topically daily to each affected toenail for 48 weeks 04/13/24   Rogelia Jerilynn RAMAN, MD  hydrOXYzine  (ATARAX ) 50 MG tablet Take 1 tablet (50 mg total) by mouth daily as needed for anxiety. 12/10/22   Lee, Jacqueline Eun, NP  mirtazapine  (REMERON ) 15 MG tablet Take 1 tablet (15 mg total) by mouth at bedtime. 12/10/22 01/09/23  Lee, Jacqueline Eun, NP  Multiple Vitamin (MULTIVITAMIN WITH MINERALS) TABS tablet Take 1 tablet by mouth daily.    [provider]  ondansetron  (ZOFRAN ) 4 MG tablet Take 1 tablet (4 mg total) by mouth every 8 (eight) hours as needed for nausea or vomiting. 07/02/23   Neysa Caron PARAS, DO  PARoxetine  (PAXIL ) 20 MG tablet Take 1 tablet (20 mg total) by mouth daily. 12/10/22  01/09/23  Lee, Jacqueline Eun, NP  QUEtiapine  (SEROQUEL ) 50 MG tablet Take 1 tablet (50 mg total) by mouth at bedtime. 12/10/22 01/09/23  Lee, Jacqueline Eun, NP  QUEtiapine  (SEROQUEL ) 50 MG tablet Take 1 tablet (50 mg total) by mouth at bedtime. 12/24/22 01/23/23  Zelaya, Oscar A, PA-C  atomoxetine (STRATTERA) 10 MG capsule Take 10 mg by mouth daily.  10/31/19  [provider]  omeprazole  (PRILOSEC) 20 MG capsule Take 1 capsule (20 mg total) by mouth 2 (two) times daily before a meal. 09/19/19 10/31/19  Darr, Lang, PA-C    Allergies: Patient has no known allergies.    Review of Systems  Constitutional:  Negative for appetite change and fatigue.  HENT:  Negative for congestion, ear discharge and sinus pressure.   Eyes:  Negative for discharge.  Respiratory:  Negative for cough.   Cardiovascular:  Negative for chest pain.  Gastrointestinal:  Negative for abdominal pain and diarrhea.  Genitourinary:  Negative for frequency and hematuria.  Musculoskeletal:  Negative for back pain.       Right foot pain  Skin:  Negative for rash.  Neurological:  Negative for seizures and headaches.  Psychiatric/Behavioral:  Negative for hallucinations.     Updated Vital Signs BP (!) 158/95 (BP Location: Right Arm)   Pulse 91  Temp 97.8 F (36.6 C) (Oral)   Resp 18   Ht 5' 11 (1.803 m)   Wt 77.1 kg   SpO2 96%   BMI 23.71 kg/m   Physical Exam Vitals and nursing note reviewed.  Constitutional:      Appearance: He is well-developed.  HENT:     Head: Normocephalic.     Nose: Nose normal.  Eyes:     General: No scleral icterus.    Conjunctiva/sclera: Conjunctivae normal.  Neck:     Thyroid : No thyromegaly.  Cardiovascular:     Rate and Rhythm: Normal rate and regular rhythm.     Heart sounds: No murmur heard.    No friction rub. No gallop.  Pulmonary:     Breath sounds: No stridor. No wheezing or rales.  Chest:     Chest wall: No tenderness.  Abdominal:     General: There is no  distension.     Tenderness: There is no abdominal tenderness. There is no rebound.  Musculoskeletal:     Cervical back: Neck supple.     Comments: Tenderness to the dorsum of the right foot  Lymphadenopathy:     Cervical: No cervical adenopathy.  Skin:    Findings: No erythema or rash.  Neurological:     Mental Status: He is alert and oriented to person, place, and time.     Motor: No abnormal muscle tone.     Coordination: Coordination normal.  Psychiatric:        Behavior: Behavior normal.     (all labs ordered are listed, but only abnormal results are displayed) Labs Reviewed - No data to display  EKG: None  Radiology: DG Ankle Complete Right Result Date: 05/28/2024 EXAM: 3 OR MORE VIEW(S) XRAY OF THE ANKLE 05/28/2024 07:41:58 AM CLINICAL HISTORY: Ankle pain. COMPARISON: None available. FINDINGS: BONES AND JOINTS: No acute fracture. No significant arthropathy.  No malalignment. SOFT TISSUES: Unremarkable. IMPRESSION: 1. No acute findings. Electronically signed by: Waddell Calk MD 05/28/2024 08:04 AM EST RP Workstation: [id number]     Procedures   Medications Ordered in the ED - No data to display                                  Medical Decision Making Amount and/or Complexity of Data Reviewed Radiology: ordered.  Risk Prescription drug management.   Inflamed dorsum of right foot.  Patient is placed on Naprosyn  and given an Ace bandage and will follow-up if not improving     Final diagnoses:  Sprain of right foot, initial encounter    ED Discharge Orders          Ordered    naproxen  (NAPROSYN ) 500 MG tablet  2 times daily        05/28/24 9147               Suzette Pac, MD 05/30/24 1821  "

## 2024-05-28 NOTE — ED Notes (Signed)
 Delay in DC d/t pt requesting pain meds prior to being DC.

## 2024-05-28 NOTE — Discharge Instructions (Signed)
 Follow up with Dr. Barton or one of his colleagues in the next 2 to 3 weeks if not improving.  Keep foot elevated

## 2024-05-28 NOTE — ED Triage Notes (Signed)
 Patient has had right ankle pain for 5 months. Denies falls. Stated it feels swollen and is painful when he walks.
